# Patient Record
Sex: Female | Born: 1965 | Race: White | Hispanic: No | Marital: Single | Smoking: Never smoker
Health system: Southern US, Community
[De-identification: ages and names within clinical notes are randomized; demographics above are authoritative.]

## PROBLEM LIST (undated history)

## (undated) DIAGNOSIS — G4730 Sleep apnea, unspecified: Secondary | ICD-10-CM

## (undated) DIAGNOSIS — F419 Anxiety disorder, unspecified: Secondary | ICD-10-CM

## (undated) DIAGNOSIS — M353 Polymyalgia rheumatica: Secondary | ICD-10-CM

## (undated) DIAGNOSIS — E785 Hyperlipidemia, unspecified: Secondary | ICD-10-CM

## (undated) DIAGNOSIS — T7840XA Allergy, unspecified, initial encounter: Secondary | ICD-10-CM

## (undated) DIAGNOSIS — D649 Anemia, unspecified: Secondary | ICD-10-CM

## (undated) DIAGNOSIS — M1990 Unspecified osteoarthritis, unspecified site: Secondary | ICD-10-CM

## (undated) DIAGNOSIS — G4733 Obstructive sleep apnea (adult) (pediatric): Secondary | ICD-10-CM

## (undated) DIAGNOSIS — E119 Type 2 diabetes mellitus without complications: Secondary | ICD-10-CM

## (undated) DIAGNOSIS — H269 Unspecified cataract: Secondary | ICD-10-CM

## (undated) DIAGNOSIS — C801 Malignant (primary) neoplasm, unspecified: Secondary | ICD-10-CM

## (undated) DIAGNOSIS — F32A Depression, unspecified: Secondary | ICD-10-CM

## (undated) DIAGNOSIS — G4700 Insomnia, unspecified: Secondary | ICD-10-CM

## (undated) DIAGNOSIS — K219 Gastro-esophageal reflux disease without esophagitis: Secondary | ICD-10-CM

## (undated) DIAGNOSIS — I10 Essential (primary) hypertension: Secondary | ICD-10-CM

## (undated) HISTORY — DX: Hyperlipidemia, unspecified: E78.5

## (undated) HISTORY — PX: POLYPECTOMY: SHX149

## (undated) HISTORY — PX: ACHILLES TENDON SURGERY: SHX542

## (undated) HISTORY — DX: Allergy, unspecified, initial encounter: T78.40XA

## (undated) HISTORY — DX: Insomnia, unspecified: G47.00

## (undated) HISTORY — DX: Depression, unspecified: F32.A

## (undated) HISTORY — DX: Essential (primary) hypertension: I10

## (undated) HISTORY — DX: Morbid (severe) obesity due to excess calories: E66.01

## (undated) HISTORY — DX: Obstructive sleep apnea (adult) (pediatric): G47.33

## (undated) HISTORY — DX: Polymyalgia rheumatica: M35.3

## (undated) HISTORY — DX: Gastro-esophageal reflux disease without esophagitis: K21.9

## (undated) HISTORY — DX: Anemia, unspecified: D64.9

## (undated) HISTORY — DX: Malignant (primary) neoplasm, unspecified: C80.1

## (undated) HISTORY — DX: Anxiety disorder, unspecified: F41.9

## (undated) HISTORY — PX: TONSILLECTOMY: SUR1361

## (undated) HISTORY — DX: Unspecified osteoarthritis, unspecified site: M19.90

## (undated) HISTORY — DX: Type 2 diabetes mellitus without complications: E11.9

## (undated) HISTORY — DX: Unspecified cataract: H26.9

## (undated) HISTORY — DX: Sleep apnea, unspecified: G47.30

## (undated) HISTORY — PX: OTHER SURGICAL HISTORY: SHX169

## (undated) HISTORY — PX: WISDOM TOOTH EXTRACTION: SHX21

---

## 2004-09-11 ENCOUNTER — Ambulatory Visit (HOSPITAL_COMMUNITY): Admission: RE | Admit: 2004-09-11 | Discharge: 2004-09-11 | Payer: Self-pay | Admitting: *Deleted

## 2004-09-22 ENCOUNTER — Encounter: Admission: RE | Admit: 2004-09-22 | Discharge: 2004-09-22 | Payer: Self-pay | Admitting: Family Medicine

## 2005-03-21 ENCOUNTER — Encounter: Admission: RE | Admit: 2005-03-21 | Discharge: 2005-03-21 | Payer: Self-pay | Admitting: Family Medicine

## 2005-11-08 ENCOUNTER — Encounter: Admission: RE | Admit: 2005-11-08 | Discharge: 2005-11-08 | Payer: Self-pay | Admitting: Family Medicine

## 2009-03-02 ENCOUNTER — Other Ambulatory Visit: Admission: RE | Admit: 2009-03-02 | Discharge: 2009-03-02 | Payer: Self-pay | Admitting: Family Medicine

## 2009-03-08 ENCOUNTER — Encounter: Admission: RE | Admit: 2009-03-08 | Discharge: 2009-03-08 | Payer: Self-pay | Admitting: Family Medicine

## 2010-03-17 ENCOUNTER — Ambulatory Visit: Payer: Self-pay | Admitting: Diagnostic Radiology

## 2010-03-17 ENCOUNTER — Ambulatory Visit (HOSPITAL_BASED_OUTPATIENT_CLINIC_OR_DEPARTMENT_OTHER): Admission: RE | Admit: 2010-03-17 | Discharge: 2010-03-17 | Payer: Self-pay | Admitting: Family Medicine

## 2011-03-30 ENCOUNTER — Other Ambulatory Visit (HOSPITAL_BASED_OUTPATIENT_CLINIC_OR_DEPARTMENT_OTHER): Payer: Self-pay | Admitting: Family Medicine

## 2011-03-30 DIAGNOSIS — Z1231 Encounter for screening mammogram for malignant neoplasm of breast: Secondary | ICD-10-CM

## 2011-04-03 ENCOUNTER — Ambulatory Visit (HOSPITAL_BASED_OUTPATIENT_CLINIC_OR_DEPARTMENT_OTHER)
Admission: RE | Admit: 2011-04-03 | Discharge: 2011-04-03 | Disposition: A | Discharge status: Home / Self Care | Payer: BC Managed Care – PPO | Source: Ambulatory Visit | Attending: Family Medicine | Admitting: Family Medicine

## 2011-04-03 DIAGNOSIS — Z1231 Encounter for screening mammogram for malignant neoplasm of breast: Secondary | ICD-10-CM | POA: Insufficient documentation

## 2012-02-25 ENCOUNTER — Other Ambulatory Visit (HOSPITAL_BASED_OUTPATIENT_CLINIC_OR_DEPARTMENT_OTHER): Payer: Self-pay | Admitting: Family Medicine

## 2012-02-25 DIAGNOSIS — Z1231 Encounter for screening mammogram for malignant neoplasm of breast: Secondary | ICD-10-CM

## 2012-04-04 ENCOUNTER — Ambulatory Visit (HOSPITAL_BASED_OUTPATIENT_CLINIC_OR_DEPARTMENT_OTHER)
Admission: RE | Admit: 2012-04-04 | Discharge: 2012-04-04 | Disposition: A | Discharge status: Home / Self Care | Payer: BC Managed Care – PPO | Source: Ambulatory Visit | Attending: Family Medicine | Admitting: Family Medicine

## 2012-04-04 DIAGNOSIS — Z1231 Encounter for screening mammogram for malignant neoplasm of breast: Secondary | ICD-10-CM

## 2012-07-28 ENCOUNTER — Other Ambulatory Visit: Payer: Self-pay | Admitting: Family Medicine

## 2012-07-28 DIAGNOSIS — R102 Pelvic and perineal pain: Secondary | ICD-10-CM

## 2012-07-30 ENCOUNTER — Other Ambulatory Visit: Payer: BC Managed Care – PPO

## 2012-08-06 ENCOUNTER — Other Ambulatory Visit: Payer: BC Managed Care – PPO

## 2013-03-23 ENCOUNTER — Other Ambulatory Visit (HOSPITAL_BASED_OUTPATIENT_CLINIC_OR_DEPARTMENT_OTHER): Payer: Self-pay | Admitting: Family Medicine

## 2013-03-23 DIAGNOSIS — Z1231 Encounter for screening mammogram for malignant neoplasm of breast: Secondary | ICD-10-CM

## 2013-04-08 ENCOUNTER — Ambulatory Visit (HOSPITAL_BASED_OUTPATIENT_CLINIC_OR_DEPARTMENT_OTHER)
Admission: RE | Admit: 2013-04-08 | Discharge: 2013-04-08 | Disposition: A | Discharge status: Home / Self Care | Payer: BC Managed Care – PPO | Source: Ambulatory Visit | Attending: Family Medicine | Admitting: Family Medicine

## 2013-04-08 DIAGNOSIS — Z1231 Encounter for screening mammogram for malignant neoplasm of breast: Secondary | ICD-10-CM | POA: Insufficient documentation

## 2013-08-06 ENCOUNTER — Other Ambulatory Visit (HOSPITAL_COMMUNITY)
Admission: RE | Admit: 2013-08-06 | Discharge: 2013-08-06 | Disposition: A | Discharge status: Home / Self Care | Payer: BC Managed Care – PPO | Source: Ambulatory Visit | Attending: Family Medicine | Admitting: Family Medicine

## 2013-08-06 ENCOUNTER — Other Ambulatory Visit: Payer: Self-pay | Admitting: Family Medicine

## 2013-08-06 DIAGNOSIS — Z124 Encounter for screening for malignant neoplasm of cervix: Secondary | ICD-10-CM | POA: Insufficient documentation

## 2013-08-06 DIAGNOSIS — Z1151 Encounter for screening for human papillomavirus (HPV): Secondary | ICD-10-CM | POA: Insufficient documentation

## 2014-03-09 ENCOUNTER — Other Ambulatory Visit (HOSPITAL_BASED_OUTPATIENT_CLINIC_OR_DEPARTMENT_OTHER): Payer: Self-pay | Admitting: Family Medicine

## 2014-03-09 DIAGNOSIS — Z1231 Encounter for screening mammogram for malignant neoplasm of breast: Secondary | ICD-10-CM

## 2014-04-14 ENCOUNTER — Ambulatory Visit (HOSPITAL_BASED_OUTPATIENT_CLINIC_OR_DEPARTMENT_OTHER)
Admission: RE | Admit: 2014-04-14 | Discharge: 2014-04-14 | Disposition: A | Discharge status: Home / Self Care | Payer: BC Managed Care – PPO | Source: Ambulatory Visit | Attending: Family Medicine | Admitting: Family Medicine

## 2014-04-14 DIAGNOSIS — Z1231 Encounter for screening mammogram for malignant neoplasm of breast: Secondary | ICD-10-CM

## 2015-03-28 ENCOUNTER — Other Ambulatory Visit (HOSPITAL_BASED_OUTPATIENT_CLINIC_OR_DEPARTMENT_OTHER): Payer: Self-pay | Admitting: Family Medicine

## 2015-03-28 DIAGNOSIS — Z1231 Encounter for screening mammogram for malignant neoplasm of breast: Secondary | ICD-10-CM

## 2015-04-19 ENCOUNTER — Ambulatory Visit (HOSPITAL_BASED_OUTPATIENT_CLINIC_OR_DEPARTMENT_OTHER)
Admission: RE | Admit: 2015-04-19 | Discharge: 2015-04-19 | Disposition: A | Discharge status: Home / Self Care | Payer: BLUE CROSS/BLUE SHIELD | Source: Ambulatory Visit | Attending: Family Medicine | Admitting: Family Medicine

## 2015-04-19 DIAGNOSIS — Z1231 Encounter for screening mammogram for malignant neoplasm of breast: Secondary | ICD-10-CM | POA: Diagnosis not present

## 2015-05-16 ENCOUNTER — Other Ambulatory Visit: Payer: Self-pay | Admitting: Family Medicine

## 2015-05-16 DIAGNOSIS — N926 Irregular menstruation, unspecified: Secondary | ICD-10-CM

## 2015-05-25 ENCOUNTER — Ambulatory Visit
Admission: RE | Admit: 2015-05-25 | Discharge: 2015-05-25 | Disposition: A | Discharge status: Home / Self Care | Payer: BLUE CROSS/BLUE SHIELD | Source: Ambulatory Visit | Attending: Family Medicine | Admitting: Family Medicine

## 2015-05-25 DIAGNOSIS — N926 Irregular menstruation, unspecified: Secondary | ICD-10-CM

## 2016-01-17 DIAGNOSIS — E782 Mixed hyperlipidemia: Secondary | ICD-10-CM | POA: Insufficient documentation

## 2016-01-17 DIAGNOSIS — G4733 Obstructive sleep apnea (adult) (pediatric): Secondary | ICD-10-CM | POA: Insufficient documentation

## 2016-01-17 DIAGNOSIS — R7302 Impaired glucose tolerance (oral): Secondary | ICD-10-CM | POA: Insufficient documentation

## 2016-01-17 DIAGNOSIS — F419 Anxiety disorder, unspecified: Secondary | ICD-10-CM | POA: Insufficient documentation

## 2016-01-17 DIAGNOSIS — Z9989 Dependence on other enabling machines and devices: Secondary | ICD-10-CM | POA: Insufficient documentation

## 2016-06-30 DIAGNOSIS — M1711 Unilateral primary osteoarthritis, right knee: Secondary | ICD-10-CM | POA: Insufficient documentation

## 2016-06-30 DIAGNOSIS — M1712 Unilateral primary osteoarthritis, left knee: Secondary | ICD-10-CM | POA: Insufficient documentation

## 2017-08-27 DIAGNOSIS — M353 Polymyalgia rheumatica: Secondary | ICD-10-CM | POA: Insufficient documentation

## 2018-08-18 DIAGNOSIS — S86011A Strain of right Achilles tendon, initial encounter: Secondary | ICD-10-CM

## 2018-08-18 HISTORY — DX: Strain of right Achilles tendon, initial encounter: S86.011A

## 2020-03-17 DIAGNOSIS — N950 Postmenopausal bleeding: Secondary | ICD-10-CM | POA: Insufficient documentation

## 2020-03-17 HISTORY — DX: Postmenopausal bleeding: N95.0

## 2020-08-30 DIAGNOSIS — I10 Essential (primary) hypertension: Secondary | ICD-10-CM | POA: Insufficient documentation

## 2020-08-31 ENCOUNTER — Encounter: Payer: Self-pay | Admitting: Internal Medicine

## 2020-09-21 DIAGNOSIS — Z7952 Long term (current) use of systemic steroids: Secondary | ICD-10-CM | POA: Insufficient documentation

## 2020-09-21 DIAGNOSIS — M791 Myalgia, unspecified site: Secondary | ICD-10-CM | POA: Insufficient documentation

## 2020-10-06 ENCOUNTER — Other Ambulatory Visit: Payer: Self-pay

## 2020-10-17 ENCOUNTER — Encounter: Payer: Self-pay | Admitting: Internal Medicine

## 2020-10-17 ENCOUNTER — Ambulatory Visit: Payer: BC Managed Care – PPO | Admitting: Internal Medicine

## 2020-10-17 VITALS — BP 110/70 | HR 88 | Ht 65.0 in | Wt 254.0 lb

## 2020-10-17 DIAGNOSIS — Z1211 Encounter for screening for malignant neoplasm of colon: Secondary | ICD-10-CM | POA: Diagnosis not present

## 2020-10-17 DIAGNOSIS — R112 Nausea with vomiting, unspecified: Secondary | ICD-10-CM

## 2020-10-17 NOTE — Progress Notes (Signed)
Diana Herman 54 y.o. 1966/04/24 [id number]  Assessment & Plan:   Encounter Diagnoses  Name Primary?  . Nausea and vomiting, intractability of vomiting not specified, unspecified vomiting type Yes  . Colon cancer screening     Observe this episodic nausea and vomiting.  Cause not clear and it has not recurred in  2 months.  No other worrisome signs or symptoms.  If it recurs she should get a 2 view abdominal film to see if we can learn something question if there is some sort of obstructive process.  We have given her an order to carry and take to a radiology office versus calling us depending upon the timing of recurrent symptoms.  She does have a hemoglobin A1c of 7.2% and a diagnosis of impaired glucose tolerance on Metformin.  It would seem unlikely that she has gastroparesis but I suppose that is in the differential.  Would consider gastric emptying study in the future if she has recurrent problems.  Schedule screening colonoscopy.  The risks and benefits as well as alternatives of endoscopic procedure(s) have been discussed and reviewed. All questions answered. The patient agrees to proceed.  I appreciate the opportunity to care for this patient. 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  Past medical history notable for polymyalgia rheumatica on steroids.  3 episodes in June and 2 in September and none since.  There were no warning signs obvious triggers.  No prior issues with this.  She saw primary care and was recommended to take a PPI.  Ondansetron will help if she needs it.  No associated headaches or other symptoms or pain.  When she called to schedule a screening colonoscopy because of the signs and symptoms it was recommended she have an office visit.  Her upper  GI review of systems is otherwise notable for occasional heartburn.  No active lower GI symptoms at this time.  Mother had colon polyps but no family history of colon cancer. No Known Allergies Current Meds  Medication Sig  . alendronate (FOSAMAX) 35 MG tablet Take 1 tablet by mouth every 7 (seven) days.  . Calcium Carb-Cholecalciferol (CALCIUM-VITAMIN D) 500-400 MG-UNIT TABS Take 1 tablet by mouth daily.  . clonazePAM (KLONOPIN) 0.5 MG tablet Take 1 tablet by mouth in the morning and at bedtime.  . DULoxetine (CYMBALTA) 60 MG capsule Take 2 capsules by mouth daily.  Marland Kitchen gabapentin (NEURONTIN) 100 MG capsule Take 1 capsule by mouth 3 (three) times daily.  Marland Kitchen losartan (COZAAR) 25 MG tablet Take 1 tablet by mouth daily.  . metFORMIN (GLUCOPHAGE) 500 MG tablet Take 2 tablets by mouth daily.  . ondansetron (ZOFRAN) 4 MG tablet Take 1 tablet by mouth every 8 (eight) hours as needed.  . predniSONE (DELTASONE) 10 MG tablet Take 20 mg by mouth daily.   . sertraline (ZOLOFT) 100 MG tablet Take 1 tablet by mouth daily.  . traZODone (DESYREL) 50 MG tablet Take by mouth. 1-2 tablets by mouth nightly as needed  . VITAMIN D PO Take 1,000 mg by mouth daily.    Past Medical History:  Diagnosis Date  . Anemia   . Anxiety   . Depression   . DM (diabetes mellitus) ([place])   . Hyperlipidemia   . Hypertension   . Insomnia   .  Morbid obesity ([place])   . OA (osteoarthritis)   . OSA (obstructive sleep apnea)    on CPAP  . Polymyalgia rheumatica ([facility])    Past Surgical History:  Procedure Laterality Date  . ACHILLES TENDON SURGERY Right   . hysteroscopy and polypectomy    . TONSILLECTOMY     Social History   Social History Narrative   Patient is single, no children.     She is an eating disorders dietitian   Never smoker 1-2 caffeinated beverages daily no alcohol no other tobacco and no drug use   family history includes Alcoholism in her father and paternal grandmother; Colon polyps in her mother;  Depression in her maternal grandmother and mother; Diabetes in her father and maternal grandfather; Heart attack in her maternal grandmother; Heart disease in her maternal grandfather and maternal uncle; Hyperlipidemia in her maternal grandmother and mother; Hypertension in her maternal grandfather, maternal grandmother, and mother; Non-Hodgkin's lymphoma in her maternal grandmother; Pancreatitis in her father; Stroke in her maternal uncle.   Review of Systems As per HPI some difficulties with her joints and her polymyalgia rheumatica, insomnia pedal edema depressed mood anxious mood back pain fatigue.  All other review of systems negative.  Objective:   Physical Exam BP 110/70 (BP Location: Left Arm, Patient Position: Sitting, Cuff Size: Normal)   Pulse 88   Ht 5\' 5"  (1.651 m) Comment: height measured without shoes  Wt 254 lb (115.2 kg) Comment: pt refused  BMI 42.27 kg/m  NAD obese Lungs cta Cor NL abd soft and Nt obese, BS + no HSM/mass Alert and oriented x 21 August 2020 labs with a normal hemoglobin 11.6, white count 11.6 normal platelets.  Hemoglobin A1c 7.2% Electrolytes liver tests kidney function all normal in October.  TSH normal at 3.17.

## 2020-10-17 NOTE — Patient Instructions (Addendum)
Normal BMI (Body Mass Index- based on height and weight) is between 19 and 25. Your BMI today is There is no height or weight on file to calculate BMI. Marland Kitchen Please consider follow up  regarding your BMI with your Primary Care Provider.  It has been recommended to you by your physician that you have a(n) colonoscopy completed. Per your request, we did not schedule the procedure(s) today. Please contact our office at [phone] should you decide to have the procedure completed. You will be scheduled for a pre-visit and procedure at that time.  We are giving you a printed order for an abdominal x-ray, should you have the abdominal pain please get it done.   I appreciate the opportunity to care for you. Silvano Rusk, MD, [facility]

## 2021-03-16 ENCOUNTER — Encounter: Payer: Self-pay | Admitting: *Deleted

## 2021-03-31 ENCOUNTER — Other Ambulatory Visit: Payer: Self-pay

## 2021-03-31 ENCOUNTER — Ambulatory Visit (AMBULATORY_SURGERY_CENTER): Payer: Self-pay | Admitting: *Deleted

## 2021-03-31 VITALS — Ht 65.0 in | Wt 242.0 lb

## 2021-03-31 DIAGNOSIS — Z1211 Encounter for screening for malignant neoplasm of colon: Secondary | ICD-10-CM

## 2021-03-31 NOTE — Progress Notes (Signed)

## 2021-04-10 ENCOUNTER — Encounter: Payer: Self-pay | Admitting: Internal Medicine

## 2021-04-14 ENCOUNTER — Ambulatory Visit (AMBULATORY_SURGERY_CENTER): Payer: BC Managed Care – PPO | Admitting: Internal Medicine

## 2021-04-14 ENCOUNTER — Encounter: Payer: Self-pay | Admitting: Internal Medicine

## 2021-04-14 ENCOUNTER — Other Ambulatory Visit: Payer: Self-pay

## 2021-04-14 VITALS — BP 128/54 | HR 72 | Temp 97.3°F | Resp 11

## 2021-04-14 DIAGNOSIS — D120 Benign neoplasm of cecum: Secondary | ICD-10-CM

## 2021-04-14 DIAGNOSIS — D123 Benign neoplasm of transverse colon: Secondary | ICD-10-CM | POA: Diagnosis not present

## 2021-04-14 DIAGNOSIS — Z1211 Encounter for screening for malignant neoplasm of colon: Secondary | ICD-10-CM | POA: Diagnosis present

## 2021-04-14 HISTORY — PX: COLONOSCOPY: SHX174

## 2021-04-14 MED ORDER — SODIUM CHLORIDE 0.9 % IV SOLN: 500.0000 mL | Freq: Once | INTRAVENOUS | Status: DC

## 2021-04-14 NOTE — Progress Notes (Signed)
pt tolerated well. VSS. awake and to recovery. Report given to RN.  

## 2021-04-14 NOTE — Progress Notes (Signed)
Pt's states no medical or surgical changes since previsit or office visit. VS by CW. 

## 2021-04-14 NOTE — Op Note (Addendum)
[place] Patient Name: Diana Herman Procedure Date: 04/14/2021 11:22 AM MRN: [id number] Endoscopist: Gatha Mayer , MD Age: 55 Referring MD:  Date of Birth: [birth year] Gender: Female Account #: [id number] Procedure:                Colonoscopy Indications:              Screening for colorectal malignant neoplasm, This                            is the patient's first colonoscopy Medicines:                Propofol per Anesthesia, Monitored Anesthesia Care Procedure:                Pre-Anesthesia Assessment:                           - Prior to the procedure, a History and Physical                            was performed, and patient medications and                            allergies were reviewed. The patient's tolerance of                            previous anesthesia was also reviewed. The risks                            and benefits of the procedure and the sedation                            options and risks were discussed with the patient.                            All questions were answered, and informed consent                            was obtained. Prior Anticoagulants: The patient has                            taken no previous anticoagulant or antiplatelet                            agents. ASA Grade Assessment: II - A patient with                            mild systemic disease. After reviewing the risks                            and benefits, the patient was deemed in                            satisfactory condition to undergo the procedure.  After obtaining informed consent, the colonoscope                            was passed under direct vision. Throughout the                            procedure, the patient's blood pressure, pulse, and                            oxygen saturations were monitored continuously. The                            Olympus PCF-H190DL (#[id number]) Colonoscope was                             introduced through the anus and advanced to the the                            cecum, identified by appendiceal orifice and                            ileocecal valve. The colonoscopy was performed                            without difficulty. The patient tolerated the                            procedure well. The quality of the bowel                            preparation was good. The ileocecal valve,                            appendiceal orifice, and rectum were photographed.                            The bowel preparation used was Miralax via split                            dose instruction. Scope In: 11:35:22 AM Scope Out: 12:09:58 PM Scope Withdrawal Time: 0 hours 27 minutes 38 seconds  Total Procedure Duration: 0 hours 34 minutes 36 seconds  Findings:                 The perianal and digital rectal examinations were                            normal.                           A 25 to 30 mm polyp was found in the cecum. The                            polyp was sessile. The polyp was removed with a  piecemeal technique using a cold snare. Resection                            and retrieval were complete. Verification of                            patient identification for the specimen was done.                            Estimated blood loss was minimal. To prevent                            bleeding after the polypectomy, six hemostatic                            clips were successfully placed (MR conditional).                            There was no bleeding at the end of the procedure.                            Estimated blood loss: none.                           Two sessile polyps were found in the transverse                            colon. The polyps were diminutive in size. These                            polyps were removed with a cold snare. Resection                            and retrieval were complete. Verification of                             patient identification for the specimen was done.                            Estimated blood loss was minimal.                           The exam was otherwise without abnormality on                            direct and retroflexion views. Complications:            No immediate complications. Estimated Blood Loss:     Estimated blood loss was minimal. Impression:               - One 25 to 30 mm polyp in the cecum, removed                            piecemeal using a cold snare. Resected and  retrieved. Clips (MR conditional) were placed.                           - Two diminutive polyps in the transverse colon,                            removed with a cold snare. Resected and retrieved.                           - The examination was otherwise normal on direct                            and retroflexion views. Recommendation:           - Patient has a contact number available for                            emergencies. The signs and symptoms of potential                            delayed complications were discussed with the                            patient. Return to normal activities tomorrow.                            Written discharge instructions were provided to the                            patient.                           - Resume previous diet.                           - Continue present medications.                           - Repeat colonoscopy is recommended for                            surveillance. The colonoscopy date will be                            determined after pathology results from today's                            exam become available for review.                           - No aspirin, ibuprofen, naproxen, or other                            non-steroidal anti-inflammatory drugs for 2 weeks  after polyp removal. Gatha Mayer, MD 04/14/2021 12:17:26 PM This report has been signed  electronically.

## 2021-04-14 NOTE — Patient Instructions (Addendum)
I found and removed 3 polyps, 2 tiny and one large. All look benign.  I will let you know pathology results and when to have another routine colonoscopy by mail and/or My Chart.  I appreciate the opportunity to care for you. Gatha Mayer, MD, FACG     NO aspirin, ibuprofen, naproxen, or other non-steroidal anti inflammatory drugs for 2 weeks.   YOU HAD AN ENDOSCOPIC PROCEDURE TODAY AT [facility]:   Refer to the procedure report that was given to you for any specific questions about what was found during the examination.  If the procedure report does not answer your questions, please call your gastroenterologist to clarify.  If you requested that your care partner not be given the details of your procedure findings, then the procedure report has been included in a sealed envelope for you to review at your convenience later.  YOU SHOULD EXPECT: Some feelings of bloating in the abdomen. Passage of more gas than usual.  Walking can help get rid of the air that was put into your GI tract during the procedure and reduce the bloating. If you had a lower endoscopy (such as a colonoscopy or flexible sigmoidoscopy) you may notice spotting of blood in your stool or on the toilet paper. If you underwent a bowel prep for your procedure, you may not have a normal bowel movement for a few days.  Please Note:  You might notice some irritation and congestion in your nose or some drainage.  This is from the oxygen used during your procedure.  There is no need for concern and it should clear up in a day or so.  SYMPTOMS TO REPORT IMMEDIATELY:   Following lower endoscopy (colonoscopy or flexible sigmoidoscopy):  Excessive amounts of blood in the stool  Significant tenderness or worsening of abdominal pains  Swelling of the abdomen that is new, acute  Fever of 100F or higher   For urgent or emergent issues, a gastroenterologist can be reached at any hour by calling [phone]. Do not  use MyChart messaging for urgent concerns.    DIET:  We do recommend a small meal at first, but then you may proceed to your regular diet.  Drink plenty of fluids but you should avoid alcoholic beverages for 24 hours.  ACTIVITY:  You should plan to take it easy for the rest of today and you should NOT DRIVE or use heavy machinery until tomorrow (because of the sedation medicines used during the test).    FOLLOW UP: Our staff will call the number listed on your records 48-72 hours following your procedure to check on you and address any questions or concerns that you may have regarding the information given to you following your procedure. If we do not reach you, we will leave a message.  We will attempt to reach you two times.  During this call, we will ask if you have developed any symptoms of COVID 19. If you develop any symptoms (ie: fever, flu-like symptoms, shortness of breath, cough etc.) before then, please call [phone].  If you test positive for Covid 19 in the 2 weeks post procedure, please call and report this information to [place].    If any biopsies were taken you will be contacted by phone or by letter within the next 1-3 weeks.  Please call us at [phone] if you have not heard about the biopsies in 3 weeks.    SIGNATURES/CONFIDENTIALITY: You and/or your care partner have signed  paperwork which will be entered into your electronic medical record.  These signatures attest to the fact that that the information above on your After Visit Summary has been reviewed and is understood.  Full responsibility of the confidentiality of this discharge information lies with you and/or your care-partner.

## 2021-04-14 NOTE — Progress Notes (Signed)
Called to room to assist during endoscopic procedure.  Patient ID and intended procedure confirmed with present staff. Received instructions for my participation in the procedure from the performing physician.  

## 2021-04-18 ENCOUNTER — Telehealth: Payer: Self-pay | Admitting: *Deleted

## 2021-04-18 NOTE — Telephone Encounter (Signed)
  Follow up Call-  Call back number 04/14/2021  Post procedure Call Back phone  # [phone]  Permission to leave phone message Yes  Some recent data might be hidden     Patient questions:  Do you have a fever, pain , or abdominal swelling? No. Pain Score  0 *  Have you tolerated food without any problems? Yes.    Have you been able to return to your normal activities? Yes.    Do you have any questions about your discharge instructions: Diet   No. Medications  No. Follow up visit  No.  Do you have questions or concerns about your Care? No.  Actions: * If pain score is 4 or above: No action needed, pain <4.  1. Have you developed a fever since your procedure? no  2.   Have you had an respiratory symptoms (SOB or cough) since your procedure? no  3.   Have you tested positive for COVID 19 since your procedure no  4.   Have you had any family members/close contacts diagnosed with the COVID 19 since your procedure?  no   If yes to any of these questions please route to Joylene John, RN and Joella Prince, RN

## 2021-04-28 ENCOUNTER — Encounter: Payer: Self-pay | Admitting: Internal Medicine

## 2021-04-28 DIAGNOSIS — Z860101 Personal history of adenomatous and serrated colon polyps: Secondary | ICD-10-CM

## 2021-04-28 DIAGNOSIS — Z86010 Personal history of colonic polyps: Secondary | ICD-10-CM | POA: Insufficient documentation

## 2021-04-28 HISTORY — DX: Personal history of colonic polyps: Z86.010

## 2021-04-28 HISTORY — DX: Personal history of adenomatous and serrated colon polyps: Z86.0101

## 2021-08-13 ENCOUNTER — Encounter (HOSPITAL_COMMUNITY): Payer: Self-pay | Admitting: Internal Medicine

## 2021-08-13 ENCOUNTER — Emergency Department (HOSPITAL_COMMUNITY): Payer: BC Managed Care – PPO

## 2021-08-13 ENCOUNTER — Other Ambulatory Visit: Payer: Self-pay

## 2021-08-13 ENCOUNTER — Observation Stay (HOSPITAL_COMMUNITY)
Admission: EM | Admit: 2021-08-13 | Discharge: 2021-08-15 | Disposition: A | Discharge status: Home / Self Care | Payer: BC Managed Care – PPO | Attending: Internal Medicine | Admitting: Internal Medicine

## 2021-08-13 DIAGNOSIS — Z79899 Other long term (current) drug therapy: Secondary | ICD-10-CM | POA: Insufficient documentation

## 2021-08-13 DIAGNOSIS — N179 Acute kidney failure, unspecified: Secondary | ICD-10-CM

## 2021-08-13 DIAGNOSIS — S0030XA Unspecified superficial injury of nose, initial encounter: Secondary | ICD-10-CM | POA: Diagnosis not present

## 2021-08-13 DIAGNOSIS — E119 Type 2 diabetes mellitus without complications: Secondary | ICD-10-CM | POA: Diagnosis not present

## 2021-08-13 DIAGNOSIS — R112 Nausea with vomiting, unspecified: Secondary | ICD-10-CM | POA: Diagnosis not present

## 2021-08-13 DIAGNOSIS — Z9989 Dependence on other enabling machines and devices: Secondary | ICD-10-CM

## 2021-08-13 DIAGNOSIS — E860 Dehydration: Secondary | ICD-10-CM

## 2021-08-13 DIAGNOSIS — Z7984 Long term (current) use of oral hypoglycemic drugs: Secondary | ICD-10-CM | POA: Diagnosis not present

## 2021-08-13 DIAGNOSIS — W19XXXA Unspecified fall, initial encounter: Secondary | ICD-10-CM | POA: Insufficient documentation

## 2021-08-13 DIAGNOSIS — G4733 Obstructive sleep apnea (adult) (pediatric): Secondary | ICD-10-CM

## 2021-08-13 DIAGNOSIS — Z23 Encounter for immunization: Secondary | ICD-10-CM | POA: Diagnosis not present

## 2021-08-13 DIAGNOSIS — Z20822 Contact with and (suspected) exposure to covid-19: Secondary | ICD-10-CM | POA: Insufficient documentation

## 2021-08-13 DIAGNOSIS — R1110 Vomiting, unspecified: Secondary | ICD-10-CM | POA: Diagnosis present

## 2021-08-13 DIAGNOSIS — Z7952 Long term (current) use of systemic steroids: Secondary | ICD-10-CM

## 2021-08-13 DIAGNOSIS — R55 Syncope and collapse: Secondary | ICD-10-CM | POA: Insufficient documentation

## 2021-08-13 DIAGNOSIS — A059 Bacterial foodborne intoxication, unspecified: Secondary | ICD-10-CM

## 2021-08-13 DIAGNOSIS — M353 Polymyalgia rheumatica: Secondary | ICD-10-CM

## 2021-08-13 DIAGNOSIS — I10 Essential (primary) hypertension: Secondary | ICD-10-CM

## 2021-08-13 DIAGNOSIS — R197 Diarrhea, unspecified: Secondary | ICD-10-CM

## 2021-08-13 HISTORY — DX: Acute kidney failure, unspecified: N17.9

## 2021-08-13 LAB — POC OCCULT BLOOD, ED: Fecal Occult Bld: NEGATIVE

## 2021-08-13 LAB — CBC WITH DIFFERENTIAL/PLATELET
Abs Immature Granulocytes: 0 10*3/uL (ref 0.00–0.07)
Basophils Absolute: 0 10*3/uL (ref 0.0–0.1)
Basophils Relative: 0 %
Eosinophils Absolute: 0.7 10*3/uL — ABNORMAL HIGH (ref 0.0–0.5)
Eosinophils Relative: 4 %
HCT: 33.2 % — ABNORMAL LOW (ref 36.0–46.0)
Hemoglobin: 10.6 g/dL — ABNORMAL LOW (ref 12.0–15.0)
Lymphocytes Relative: 11 %
Lymphs Abs: 2 10*3/uL (ref 0.7–4.0)
MCH: 27.5 pg (ref 26.0–34.0)
MCHC: 31.9 g/dL (ref 30.0–36.0)
MCV: 86 fL (ref 80.0–100.0)
Monocytes Absolute: 0.7 10*3/uL (ref 0.1–1.0)
Monocytes Relative: 4 %
Neutro Abs: 14.6 10*3/uL — ABNORMAL HIGH (ref 1.7–7.7)
Neutrophils Relative %: 81 %
Platelets: 371 10*3/uL (ref 150–400)
RBC: 3.86 MIL/uL — ABNORMAL LOW (ref 3.87–5.11)
RDW: 15.9 % — ABNORMAL HIGH (ref 11.5–15.5)
WBC: 18 10*3/uL — ABNORMAL HIGH (ref 4.0–10.5)
nRBC: 0.2 % (ref 0.0–0.2)
nRBC: 1 /100 WBC — ABNORMAL HIGH

## 2021-08-13 LAB — COMPREHENSIVE METABOLIC PANEL
ALT: 20 U/L (ref 0–44)
AST: 24 U/L (ref 15–41)
Albumin: 3.1 g/dL — ABNORMAL LOW (ref 3.5–5.0)
Alkaline Phosphatase: 50 U/L (ref 38–126)
Anion gap: 15 (ref 5–15)
BUN: 22 mg/dL — ABNORMAL HIGH (ref 6–20)
CO2: 18 mmol/L — ABNORMAL LOW (ref 22–32)
Calcium: 8.6 mg/dL — ABNORMAL LOW (ref 8.9–10.3)
Chloride: 99 mmol/L (ref 98–111)
Creatinine, Ser: 3.36 mg/dL — ABNORMAL HIGH (ref 0.44–1.00)
GFR, Estimated: 16 mL/min — ABNORMAL LOW (ref >60)
Glucose, Bld: 130 mg/dL — ABNORMAL HIGH (ref 70–99)
Potassium: 3.9 mmol/L (ref 3.5–5.1)
Sodium: 132 mmol/L — ABNORMAL LOW (ref 135–145)
Total Bilirubin: 0.8 mg/dL (ref 0.3–1.2)
Total Protein: 6.1 g/dL — ABNORMAL LOW (ref 6.5–8.1)

## 2021-08-13 LAB — URINALYSIS, MICROSCOPIC (REFLEX)

## 2021-08-13 LAB — I-STAT CHEM 8, ED
BUN: 24 mg/dL — ABNORMAL HIGH (ref 6–20)
Calcium, Ion: 1.04 mmol/L — ABNORMAL LOW (ref 1.15–1.40)
Chloride: 99 mmol/L (ref 98–111)
Creatinine, Ser: 3.4 mg/dL — ABNORMAL HIGH (ref 0.44–1.00)
Glucose, Bld: 122 mg/dL — ABNORMAL HIGH (ref 70–99)
HCT: 34 % — ABNORMAL LOW (ref 36.0–46.0)
Hemoglobin: 11.6 g/dL — ABNORMAL LOW (ref 12.0–15.0)
Potassium: 3.9 mmol/L (ref 3.5–5.1)
Sodium: 133 mmol/L — ABNORMAL LOW (ref 135–145)
TCO2: 19 mmol/L — ABNORMAL LOW (ref 22–32)

## 2021-08-13 LAB — URINALYSIS, ROUTINE W REFLEX MICROSCOPIC
Bilirubin Urine: NEGATIVE
Glucose, UA: NEGATIVE mg/dL
Ketones, ur: NEGATIVE mg/dL
Nitrite: NEGATIVE
Protein, ur: NEGATIVE mg/dL
Specific Gravity, Urine: <1.005 — ABNORMAL LOW (ref 1.005–1.030)
pH: 6 (ref 5.0–8.0)

## 2021-08-13 LAB — RESP PANEL BY RT-PCR (FLU A&B, COVID) ARPGX2
Influenza A by PCR: NEGATIVE
Influenza B by PCR: NEGATIVE
SARS Coronavirus 2 by RT PCR: NEGATIVE

## 2021-08-13 LAB — LACTIC ACID, PLASMA
Lactic Acid, Venous: 4 mmol/L (ref 0.5–1.9)
Lactic Acid, Venous: 3.2 mmol/L (ref 0.5–1.9)
Lactic Acid, Venous: 6.2 mmol/L (ref 0.5–1.9)

## 2021-08-13 LAB — TYPE AND SCREEN
ABO/RH(D): O POS
Antibody Screen: NEGATIVE

## 2021-08-13 LAB — GLUCOSE, CAPILLARY: Glucose-Capillary: 108 mg/dL — ABNORMAL HIGH (ref 70–99)

## 2021-08-13 LAB — TROPONIN I (HIGH SENSITIVITY)
Troponin I (High Sensitivity): 173 ng/L (ref <18)
Troponin I (High Sensitivity): 111 ng/L (ref <18)

## 2021-08-13 LAB — HEMOGLOBIN A1C
Hgb A1c MFr Bld: 6.7 % — ABNORMAL HIGH (ref 4.8–5.6)
Mean Plasma Glucose: 145.59 mg/dL

## 2021-08-13 LAB — ABO/RH: ABO/RH(D): O POS

## 2021-08-13 LAB — PROTIME-INR
INR: 1.1 (ref 0.8–1.2)
Prothrombin Time: 13.9 seconds (ref 11.4–15.2)

## 2021-08-13 LAB — CBG MONITORING, ED: Glucose-Capillary: 97 mg/dL (ref 70–99)

## 2021-08-13 MED ORDER — ONDANSETRON HCL 4 MG PO TABS: 4.0000 mg | ORAL_TABLET | Freq: Four times a day (QID) | ORAL | Status: DC | PRN

## 2021-08-13 MED ORDER — CLONAZEPAM 0.5 MG PO TABS: 0.5000 mg | ORAL_TABLET | Freq: Two times a day (BID) | ORAL | Status: DC | PRN

## 2021-08-13 MED ORDER — HYDROCORTISONE SOD SUC (PF) 100 MG IJ SOLR
100.0000 mg | Freq: Once | INTRAMUSCULAR | Status: AC
  Administered 2021-08-13: 100 mg via INTRAVENOUS
  Filled 2021-08-13: qty 2

## 2021-08-13 MED ORDER — SODIUM CHLORIDE 0.9 % IV BOLUS
1000.0000 mL | Freq: Once | INTRAVENOUS | Status: AC
  Administered 2021-08-13: 1000 mL via INTRAVENOUS

## 2021-08-13 MED ORDER — INSULIN ASPART 100 UNIT/ML IJ SOLN
0.0000 [IU] | Freq: Three times a day (TID) | INTRAMUSCULAR | Status: DC
  Administered 2021-08-14 (×2): 2 [IU] via SUBCUTANEOUS
  Administered 2021-08-15: 3 [IU] via SUBCUTANEOUS

## 2021-08-13 MED ORDER — ACETAMINOPHEN 650 MG RE SUPP: 650.0000 mg | Freq: Four times a day (QID) | RECTAL | Status: DC | PRN

## 2021-08-13 MED ORDER — ACETAMINOPHEN 325 MG PO TABS
650.0000 mg | ORAL_TABLET | Freq: Four times a day (QID) | ORAL | Status: DC | PRN
  Administered 2021-08-14 – 2021-08-15 (×3): 650 mg via ORAL
  Filled 2021-08-13 (×3): qty 2

## 2021-08-13 MED ORDER — VANCOMYCIN HCL 2000 MG/400ML IV SOLN
2000.0000 mg | Freq: Once | INTRAVENOUS | Status: AC
  Administered 2021-08-13: 2000 mg via INTRAVENOUS
  Filled 2021-08-13: qty 400

## 2021-08-13 MED ORDER — ONDANSETRON HCL 4 MG/2ML IJ SOLN: 4.0000 mg | Freq: Four times a day (QID) | INTRAMUSCULAR | Status: DC | PRN

## 2021-08-13 MED ORDER — SERTRALINE HCL 100 MG PO TABS
100.0000 mg | ORAL_TABLET | Freq: Every day | ORAL | Status: DC
  Administered 2021-08-14 – 2021-08-15 (×2): 100 mg via ORAL
  Filled 2021-08-13 (×2): qty 1

## 2021-08-13 MED ORDER — LACTATED RINGERS IV SOLN: INTRAVENOUS | Status: DC

## 2021-08-13 MED ORDER — TRAZODONE HCL 50 MG PO TABS: 50.0000 mg | ORAL_TABLET | Freq: Every evening | ORAL | Status: DC | PRN

## 2021-08-13 MED ORDER — DULOXETINE HCL 60 MG PO CPEP
120.0000 mg | ORAL_CAPSULE | Freq: Every day | ORAL | Status: DC
  Administered 2021-08-14 – 2021-08-15 (×2): 120 mg via ORAL
  Filled 2021-08-13 (×2): qty 2

## 2021-08-13 MED ORDER — HEPARIN SODIUM (PORCINE) 5000 UNIT/ML IJ SOLN
5000.0000 [IU] | Freq: Three times a day (TID) | INTRAMUSCULAR | Status: DC
  Administered 2021-08-14 – 2021-08-15 (×3): 5000 [IU] via SUBCUTANEOUS
  Filled 2021-08-13 (×3): qty 1

## 2021-08-13 MED ORDER — METOPROLOL TARTRATE 5 MG/5ML IV SOLN: 2.5000 mg | Freq: Four times a day (QID) | INTRAVENOUS | Status: DC | PRN

## 2021-08-13 MED ORDER — SODIUM CHLORIDE 0.9 % IV SOLN
2.0000 g | Freq: Once | INTRAVENOUS | Status: AC
  Administered 2021-08-13: 2 g via INTRAVENOUS
  Filled 2021-08-13: qty 2

## 2021-08-13 MED ORDER — VANCOMYCIN VARIABLE DOSE PER UNSTABLE RENAL FUNCTION (PHARMACIST DOSING): Status: DC

## 2021-08-13 MED ORDER — PREDNISONE 20 MG PO TABS
20.0000 mg | ORAL_TABLET | Freq: Every day | ORAL | Status: DC
  Administered 2021-08-14 – 2021-08-15 (×2): 20 mg via ORAL
  Filled 2021-08-13 (×2): qty 1

## 2021-08-13 MED ORDER — SODIUM CHLORIDE 0.9 % IV SOLN: 2.0000 g | INTRAVENOUS | Status: DC

## 2021-08-13 MED ORDER — TETANUS-DIPHTH-ACELL PERTUSSIS 5-2.5-18.5 LF-MCG/0.5 IM SUSY
0.5000 mL | PREFILLED_SYRINGE | Freq: Once | INTRAMUSCULAR | Status: AC
  Administered 2021-08-13: 0.5 mL via INTRAMUSCULAR
  Filled 2021-08-13: qty 0.5

## 2021-08-13 MED ORDER — HYDROCODONE-ACETAMINOPHEN 5-325 MG PO TABS: 1.0000 | ORAL_TABLET | ORAL | Status: DC | PRN

## 2021-08-13 MED ORDER — INSULIN ASPART 100 UNIT/ML IJ SOLN: 0.0000 [IU] | Freq: Every day | INTRAMUSCULAR | Status: DC

## 2021-08-13 NOTE — ED Triage Notes (Signed)
Pt BIB [facility] from home after her brother found her unresponsive on the floor, she reports est. 4 syncopal episodes w/LOC in the past 2 days. Pt has had N/V/D for 2 days. EMS reports the pt was diaphoretic upon arrival with BP of 70/40. Pt is not on blood thinners

## 2021-08-13 NOTE — Progress Notes (Signed)
Pharmacy Antibiotic Note  ETTER ROYALL is a 55 y.o. female admitted on 08/13/2021 with sepsis.  Pharmacy has been consulted for vancomycin and cefepime dosing.  Patient with a history of anemia, anxiety, depression, cataract, DM, GERD, HLD, HTN, insomnia, morbid obsesity, OA, OSA (on CPAP), polymyalgia rheumatica. Patient presenting with near syncope following several episodes of N/V/D.  Patient's serum creatinine is 3.4 (SCr 0.79 per [facility] records from last month. WBC 18; LA 4  Plan: Cefepime 2g q24h Vancomycin 2000 mg once, subsequent dosing as indicated per random vancomycin level until renal function stable and/or improved, at which time scheduled dosing can be considered Follow-up cultures and de-escalate antibiotics as appropriate. Trend WBC, fever, clinical course F/u cultures De-escalate when able  Height: 5\' 5"  (165.1 cm) Weight: 111.1 kg (245 lb) IBW/kg (Calculated) : 57  Temp (24hrs), Avg:98.5 F (36.9 C), Min:98.5 F (36.9 C), Max:98.5 F (36.9 C)  Recent Labs  Lab 08/13/21 1348 08/13/21 1453  WBC 18.0*  --   CREATININE 3.36* 3.40*  LATICACIDVEN 4.0*  --     Estimated Creatinine Clearance: 23.7 mL/min (A) (by C-G formula based on SCr of 3.4 mg/dL (H)).    Allergies  Allergen Reactions   Other Other (See Comments)    Apri causes depression    Antimicrobials this admission: cefepime 9/25 >>  vancomycin 9/25 >>   Microbiology results: Pending  Thank you for allowing pharmacy to be a part of this patient's care.  Lorelei Pont, PharmD, BCPS 08/13/2021 4:07 PM ED Clinical Pharmacist -  [phone]

## 2021-08-13 NOTE — Assessment & Plan Note (Signed)
Given sudden onset of N/V/D soon after she ate at [facility], I assume this is from pre-formed toxins in the food. Continue supportive care with IV Zofran.

## 2021-08-13 NOTE — Assessment & Plan Note (Signed)
Admit to medical bed. Continue with IVF. Will stop IVF after 12 more hours.  Diabetic diet.  Repeat labs in AM and tomorrow afternoon. If pt shows steady improvement in renal function, is able to eat/drink/urinate without difficulty, then it is possible pt could be discharge tomorrow evening to f/u with PCP end of this week for repeat BMP.

## 2021-08-13 NOTE — Assessment & Plan Note (Signed)
Chronic. 

## 2021-08-13 NOTE — Assessment & Plan Note (Signed)
Stable

## 2021-08-13 NOTE — Assessment & Plan Note (Signed)
Hold losartan for now in face of AKI.

## 2021-08-13 NOTE — ED Provider Notes (Signed)
  Physical Exam  BP (!) 93/57   Pulse 99   Temp 98.5 F (36.9 C) (Oral)   Resp (!) 25   Ht 5\' 5"  (1.651 m)   Wt 111.1 kg   LMP 03/21/2012   SpO2 100%   BMI 40.77 kg/m   Physical Exam  ED Course/Procedures     Procedures  MDM  Received patient in signout.  Syncopal episodes.  Nausea vomiting diarrhea.  Hypotensive.  All began after eating at [place] [employer].  Has an acute kidney injury with a creatinine now up to 3.  Baseline is normal.  Labs reviewed.  White count mildly elevated.  Had empirically been given antibiotics.  Lactate elevated but I think this is more likely a dehydration as opposed to a severe sepsis.  Has had 2 L of fluid and third liter going in now.  Blood pressure has improved.  Patient feeling less dizzy.  Continuing to have diarrhea here.  Lac on nose repaired.  Will admit to unassigned medicine.  CRITICAL CARE Performed by: Davonna Belling Total critical care time: 30 minutes Critical care time was exclusive of separately billable procedures and treating other patients. Critical care was necessary to treat or prevent imminent or life-threatening deterioration. Critical care was time spent personally by me on the following activities: development of treatment plan with patient and/or surrogate as well as nursing, discussions with consultants, evaluation of patient's response to treatment, examination of patient, obtaining history from patient or surrogate, ordering and performing treatments and interventions, ordering and review of laboratory studies, ordering and review of radiographic studies, pulse oximetry and re-evaluation of patient's condition.        Davonna Belling, MD 08/13/21 [phone]

## 2021-08-13 NOTE — Progress Notes (Signed)
[facility] following sepsis 

## 2021-08-13 NOTE — Subjective & Objective (Signed)
CC: N/V/D HPI: 55 year old Caucasian female with a history of type 2 diabetes, history of polymyalgia rheumatica on chronic daily prednisone 20 mg, hypertension, morbid obesity who presents to the ER today with a overnight history of nausea vomiting and diarrhea.  Patient states that she went to [place] [employer] on [address]. yesterday evening for dinner.  This was about 6-7 o'clock.  She states that when she was driving home, she is had to get out of her car several times due to projectile vomiting.  Since she got home, she started having massive diarrhea.  Over the course the next 12 hours she continued to have nausea vomiting along with concomitant diarrhea.  Patient states he tried to drink some water but ended up throwing this back up.  By time her mother got to her this morning, the patient was feeling presyncopal every time she would try to stand up.   Patient brought to the ER.  Patient noted to be hypotensive on her first arrival .  With initial blood pressure of 81-54.  Code sepsis was started.  Patient given 3 L of fluid.  Patient is on chronic prednisone.  Patient given 100 mg of IV hydrocortisone.  Laboratory evaluation performed.  Initial lactic acid was 4.0.  Serum creatinine had increased to 3.36 with a BUN of 22 and a bicarbonate of 18.  After 3 L of fluid, lactic acid improved to 3.2.  Patient was no longer hypotensive.  She was awake and alert.  Repeat blood pressure had increased to 130/63.  Patient does have the urge to urinate now after 3 L.  Due to the patient's acute kidney injury, [facility] hospitalist contacted for admission.

## 2021-08-13 NOTE — Assessment & Plan Note (Signed)
Add SSI. Holding metformin.

## 2021-08-13 NOTE — Assessment & Plan Note (Signed)
Likely due to food poisoning.  Continue with supportive care.

## 2021-08-13 NOTE — ED Provider Notes (Signed)
[facility] Provider Note   CSN: [id number] Arrival date & time: 08/13/21  1344     History Chief Complaint  Patient presents with   Loss of Consciousness    Diana Herman is a 55 y.o. female.  55 year old female with prior medical history as detailed below presents for evaluation.  Patient reports that she ate at a restaurant last night -[employer].  She felt fine until shortly after finishing her meal.  On the way home from the restaurant she had multiple episodes of vomiting.  Overnight she had continued vomiting associated loose watery diarrhea.  She reports multiple episodes overnight of near syncope.  This morning her brother found her on the floor of her residence unresponsive.  Apparently she was able to be aroused and when her family tried to stand her up she passed out again.  Currently she is alert and oriented.  She denies specific injury or pain.  She does have a superficial laceration across the bridge of the nose.  She does not recall a specific fall that could have caused the laceration.  Prior history is significant for longstanding history of polymyalgia rheumatica.  She takes 20 mg of prednisone daily.  She does not think that she has had hypertension in the past related to her prolonged steroid use.  The history is provided by the patient.  Illness Location:  Nausea, vomiting, hypotension, syncope, diarrhea Severity:  Severe Onset quality:  Sudden Duration:  1 day Timing:  Constant Progression:  Unchanged Chronicity:  New     Past Medical History:  Diagnosis Date   Allergy    Anemia    Anxiety    Cataract    forming    Depression    DM (diabetes mellitus) ([place])    GERD (gastroesophageal reflux disease)    past    Hx of adenomatous colonic polyps 04/28/2021   Hyperlipidemia    Hypertension    Insomnia    Morbid obesity ([facility])    OA (osteoarthritis)    OSA (obstructive sleep apnea)    on CPAP    Polymyalgia rheumatica ([facility])    Sleep apnea    wears cpap     Patient Active Problem List   Diagnosis Date Noted   Hx of adenomatous colonic polyps 04/28/2021   Long term current use of systemic steroids 09/21/2020   Myalgia 09/21/2020   Primary hypertension 08/30/2020   Postmenopausal bleeding 03/17/2020   Morbid obesity ([place]) 01/01/2019   Achilles rupture, right 08/18/2018   Polymyalgia rheumatica ([place]) 08/27/2017   Primary osteoarthritis of left knee 06/30/2016   Primary osteoarthritis of right knee 06/30/2016   Anxiety 01/17/2016   Impaired glucose tolerance 01/17/2016   Mixed hyperlipidemia 01/17/2016   OSA on CPAP 01/17/2016    Past Surgical History:  Procedure Laterality Date   ACHILLES TENDON SURGERY Right    COLONOSCOPY  04/14/2021   [facility]   hysteroscopy and polypectomy     TONSILLECTOMY     WISDOM TOOTH EXTRACTION     early 90's      OB History   No obstetric history on file.     Family History  Problem Relation Age of Onset   Colon polyps Mother    Depression Mother    Hypertension Mother    Hyperlipidemia Mother    Diabetes Father    Alcoholism Father    Pancreatitis Father    Non-Hodgkin's lymphoma Maternal Grandmother    Heart attack Maternal  Grandmother    Depression Maternal Grandmother    Hypertension Maternal Grandmother    Hyperlipidemia Maternal Grandmother    Diabetes Maternal Grandfather    Heart disease Maternal Grandfather    Hypertension Maternal Grandfather    Alcoholism Paternal Grandmother    Diverticulitis Paternal Grandmother    Heart disease Maternal Uncle    Stroke Maternal Uncle    Colon cancer Neg Hx    Esophageal cancer Neg Hx    Rectal cancer Neg Hx    Stomach cancer Neg Hx     Social History   Tobacco Use   Smoking status: Never   Smokeless tobacco: Never  Vaping Use   Vaping Use: Never used  Substance Use Topics   Alcohol use: Yes    Comment: once a year   Drug use: Never    Home  Medications Prior to Admission medications   Medication Sig Start Date End Date Taking? Authorizing Provider  alendronate (FOSAMAX) 35 MG tablet Take 1 tablet by mouth every 7 (seven) days. 05/30/20   [provider]  Calcium Carb-Cholecalciferol (CALCIUM-VITAMIN D) 500-400 MG-UNIT TABS Take 1 tablet by mouth daily.    [provider]  clonazePAM (KLONOPIN) 0.5 MG tablet Take 1 tablet by mouth 2 (two) times daily as needed. 07/19/08   [provider]  DULoxetine (CYMBALTA) 60 MG capsule Take 2 capsules by mouth daily. 05/30/20   [provider]  eszopiclone (LUNESTA) 2 MG TABS tablet Take 1 tablet by mouth at bedtime. 01/04/21   [provider]  gabapentin (NEURONTIN) 300 MG capsule Take 1 capsule by mouth daily. 01/18/21   [provider]  losartan (COZAAR) 25 MG tablet Take 1 tablet by mouth daily. 08/31/20   [provider]  metFORMIN (GLUCOPHAGE) 500 MG tablet Take 1,000 mg by mouth daily with breakfast. 08/31/20   [provider]  ondansetron (ZOFRAN) 4 MG tablet Take 1 tablet by mouth every 8 (eight) hours as needed. 05/05/20   [provider]  pilocarpine (SALAGEN) 5 MG tablet Take by mouth as needed. 12/19/20 12/19/21  [provider]  predniSONE (DELTASONE) 10 MG tablet Take 20 mg by mouth daily.  08/08/20   [provider]  sertraline (ZOLOFT) 100 MG tablet Take 1 tablet by mouth daily. 07/11/20   [provider]  traZODone (DESYREL) 50 MG tablet Take by mouth. 1-2 tablets by mouth nightly as needed 05/30/20   [provider]  VITAMIN D PO Take 1,000 mg by mouth daily.     [provider]    Allergies    Other  Review of Systems   Review of Systems  All other systems reviewed and are negative.  Physical Exam Updated Vital Signs BP (!) 81/54 (BP Location: Right Arm)   Pulse 97   Temp 98.5 F (36.9 C) (Oral)   Resp (!) 24   Ht 5\' 5"  (1.651 m)   Wt 111.1 kg   LMP  03/21/2012   SpO2 96%   BMI 40.77 kg/m   Physical Exam Vitals and nursing note reviewed.  Constitutional:      General: She is not in acute distress.    Appearance: She is well-developed.  HENT:     Head: Normocephalic and atraumatic.     Nose:     Comments: Superficial 0.5 cm laceration over the bridge of the nose.  Nose itself is nontender.  There is no evidence of displaced nasal bone fracture.  No septal hematoma noted. Eyes:  Pupils: Pupils are equal, round, and reactive to light.     Comments: Pale conjunctivae   Cardiovascular:     Rate and Rhythm: Regular rhythm. Tachycardia present.     Heart sounds: Normal heart sounds.  Pulmonary:     Effort: Pulmonary effort is normal. No respiratory distress.     Breath sounds: Normal breath sounds.  Abdominal:     General: There is no distension.     Palpations: Abdomen is soft.     Tenderness: There is no abdominal tenderness.  Musculoskeletal:        General: No deformity. Normal range of motion.     Cervical back: Normal range of motion and neck supple.  Skin:    General: Skin is warm and dry.  Neurological:     General: No focal deficit present.     Mental Status: She is alert and oriented to person, place, and time.     Cranial Nerves: No cranial nerve deficit.     Sensory: No sensory deficit.     Motor: No weakness.    ED Results / Procedures / Treatments   Labs (all labs ordered are listed, but only abnormal results are displayed) Labs Reviewed  RESP PANEL BY RT-PCR (FLU A&B, COVID) ARPGX2  CULTURE, BLOOD (ROUTINE X 2)  CULTURE, BLOOD (ROUTINE X 2)  COMPREHENSIVE METABOLIC PANEL  LACTIC ACID, PLASMA  LACTIC ACID, PLASMA  CBC WITH DIFFERENTIAL/PLATELET  PROTIME-INR  URINALYSIS, ROUTINE W REFLEX MICROSCOPIC  I-STAT CHEM 8, ED  CBG MONITORING, ED  POC OCCULT BLOOD, ED  TYPE AND SCREEN  TROPONIN I (HIGH SENSITIVITY)    EKG EKG Interpretation  Date/Time:  Sunday August 13 2021 13:56:58  EDT Ventricular Rate:  100 PR Interval:  132 QRS Duration: 109 QT Interval:  377 QTC Calculation: 487 R Axis:   90 Text Interpretation: Sinus tachycardia Borderline right axis deviation Minimal ST depression, lateral leads Borderline prolonged QT interval Confirmed by Dene Gentry [phone]) on 08/13/2021 2:09:48 PM  Radiology CT Head Wo Contrast  Result Date: 08/13/2021 CLINICAL DATA:  Pt BIB [facility] from home after her brother found her unresponsive on the floor, she reports est. 4 syncopal episodes w/LOC in the past 2 days. Pt has had N/V/D for 2 days. EMS reports the pt was diaphoretic upon arrival with BP of 70/40. Pt is not on blood thinners EXAM: CT HEAD WITHOUT CONTRAST CT CERVICAL SPINE WITHOUT CONTRAST TECHNIQUE: Multidetector CT imaging of the head and cervical spine was performed following the standard protocol without intravenous contrast. Multiplanar CT image reconstructions of the cervical spine were also generated. COMPARISON:  None. FINDINGS: CT HEAD FINDINGS Brain: No evidence of acute infarction, hemorrhage, hydrocephalus, extra-axial collection or mass lesion/mass effect. Vascular: No hyperdense vessel or unexpected calcification. Skull: Normal. Negative for fracture or focal lesion. Sinuses/Orbits: Normal globes and orbits.  Clear sinuses. Other: None. CT CERVICAL SPINE FINDINGS Alignment: Normal. Skull base and vertebrae: No acute fracture. No primary bone lesion or focal pathologic process. Soft tissues and spinal canal: No prevertebral fluid or swelling. No visible canal hematoma. Disc levels: Disc spaces, central spinal canal and neural foramina are well preserved. No evidence of a disc herniation. Small anterior endplate osteophytes, C3 through C7. No other degenerative change. Upper chest: Negative. Other: None. IMPRESSION: HEAD CT 1. Normal. CERVICAL CT 1. No fracture or acute finding. Electronically Signed   By: Lajean Manes M.D.   On: 08/13/2021 14:18   CT Cervical Spine Wo  Contrast  Result Date: 08/13/2021  CLINICAL DATA:  Pt BIB [facility] from home after her brother found her unresponsive on the floor, she reports est. 4 syncopal episodes w/LOC in the past 2 days. Pt has had N/V/D for 2 days. EMS reports the pt was diaphoretic upon arrival with BP of 70/40. Pt is not on blood thinners EXAM: CT HEAD WITHOUT CONTRAST CT CERVICAL SPINE WITHOUT CONTRAST TECHNIQUE: Multidetector CT imaging of the head and cervical spine was performed following the standard protocol without intravenous contrast. Multiplanar CT image reconstructions of the cervical spine were also generated. COMPARISON:  None. FINDINGS: CT HEAD FINDINGS Brain: No evidence of acute infarction, hemorrhage, hydrocephalus, extra-axial collection or mass lesion/mass effect. Vascular: No hyperdense vessel or unexpected calcification. Skull: Normal. Negative for fracture or focal lesion. Sinuses/Orbits: Normal globes and orbits.  Clear sinuses. Other: None. CT CERVICAL SPINE FINDINGS Alignment: Normal. Skull base and vertebrae: No acute fracture. No primary bone lesion or focal pathologic process. Soft tissues and spinal canal: No prevertebral fluid or swelling. No visible canal hematoma. Disc levels: Disc spaces, central spinal canal and neural foramina are well preserved. No evidence of a disc herniation. Small anterior endplate osteophytes, C3 through C7. No other degenerative change. Upper chest: Negative. Other: None. IMPRESSION: HEAD CT 1. Normal. CERVICAL CT 1. No fracture or acute finding. Electronically Signed   By: Lajean Manes M.D.   On: 08/13/2021 14:18    Procedures .Marland KitchenLaceration Repair  Date/Time: 08/13/2021 2:43 PM Performed by: Valarie Merino, MD Authorized by: Valarie Merino, MD   Consent:    Consent obtained:  Verbal   Risks, benefits, and alternatives were discussed: yes     Risks discussed:  Infection, need for additional repair, nerve damage, poor wound healing, poor cosmetic result, retained  foreign body, tendon damage, vascular damage and pain Universal protocol:    Immediately prior to procedure, a time out was called: yes     Patient identity confirmed:  Verbally with patient Anesthesia:    Anesthesia method:  None Laceration details:    Location: nasal bridge.   Length (cm):  0.5 Exploration:    Limited defect created (wound extended): yes     Hemostasis achieved with:  Direct pressure   Contaminated: yes   Treatment:    Area cleansed with:  Saline   Amount of cleaning:  Standard   Irrigation solution:  Sterile saline   Visualized foreign bodies/material removed: no     Debridement:  None   Undermining:  None Skin repair:    Repair method:  Tissue adhesive Approximation:    Approximation:  Close Repair type:    Repair type:  Simple Post-procedure details:    Dressing:  Open (no dressing)   Procedure completion:  Tolerated   Medications Ordered in ED Medications  Tdap (BOOSTRIX) injection 0.5 mL (has no administration in time range)  hydrocortisone sodium succinate (SOLU-CORTEF) 100 MG injection 100 mg (has no administration in time range)  ceFEPIme (MAXIPIME) 2 g in sodium chloride 0.9 % 100 mL IVPB (has no administration in time range)  vancomycin (VANCOREADY) IVPB 2000 mg/400 mL (has no administration in time range)  sodium chloride 0.9 % bolus 1,000 mL (1,000 mLs Intravenous New Bag/Given 08/13/21 1413)    ED Course  I have reviewed the triage vital signs and the nursing notes.  Pertinent labs & imaging results that were available during my care of the patient were reviewed by me and considered in my medical decision making (see chart for details).    MDM  Rules/Calculators/A&P                          CRITICAL CARE Performed by: Valarie Merino   Total critical care time: 30 minutes  Critical care time was exclusive of separately billable procedures and treating other patients.  Critical care was necessary to treat or prevent imminent or  life-threatening deterioration.  Critical care was time spent personally by me on the following activities: development of treatment plan with patient and/or surrogate as well as nursing, discussions with consultants, evaluation of patient's response to treatment, examination of patient, obtaining history from patient or surrogate, ordering and performing treatments and interventions, ordering and review of laboratory studies, ordering and review of radiographic studies, pulse oximetry and re-evaluation of patient's condition.   MDM  MSE complete  Diana Herman was evaluated in Emergency Department on 08/13/2021 for the symptoms described in the history of present illness. She was evaluated in the context of the global COVID-19 pandemic, which necessitated consideration that the patient might be at risk for infection with the SARS-CoV-2 virus that causes COVID-19. Institutional protocols and algorithms that pertain to the evaluation of patients at risk for COVID-19 are in a state of rapid change based on information released by regulatory bodies including the CDC and federal and state organizations. These policies and algorithms were followed during the patient's care in the ED.  Patient is presenting with complaint of significant nausea, vomiting, and diarrhea.  This was associated with syncopal event and hypotension.  Patient is on longstanding chronic steroids.  Patient is mentating well upon arrival to the ED.  EMS administered 1 L IV fluids prior to arrival.  Will continue to fluid resuscitate and will.  We will also administer stress dose steroids and broad-spectrum antibiotics.  Patient will likely require admission. Dispo signed out to Dr. Alvino Chapel.     Final Clinical Impression(s) / ED Diagnoses Final diagnoses:  Dehydration  AKI (acute kidney injury) ([place])  Diarrhea, unspecified type    Rx / DC Orders ED Discharge Orders     None        Valarie Merino,  MD 08/14/21 2344

## 2021-08-13 NOTE — Assessment & Plan Note (Signed)
Continue with prednisone 20 mg daily. 

## 2021-08-13 NOTE — H&P (Signed)
History and Physical    MIKENNA BUNKLEY IWP:[id number] DOB: May 23, 1966 DOA: 08/13/2021  PCP: Wayland Salinas, MD   Patient coming from: Home  I have personally briefly reviewed patient's old medical records in [place]  CC: N/V/D HPI: 55 year old Caucasian female with a history of type 2 diabetes, history of polymyalgia rheumatica on chronic daily prednisone 20 mg, hypertension, morbid obesity who presents to the ER today with a overnight history of nausea vomiting and diarrhea.  Patient states that she went to [employer] on [address]. yesterday evening for dinner.  This was about 6-7 o'clock.  She states that when she was driving home, she is had to get out of her car several times due to projectile vomiting.  Since she got home, she started having massive diarrhea.  Over the course the next 12 hours she continued to have nausea vomiting along with concomitant diarrhea.  Patient states he tried to drink some water but ended up throwing this back up.  By time her mother got to her this morning, the patient was feeling presyncopal every time she would try to stand up.   Patient brought to the ER.  Patient noted to be hypotensive on her first arrival .  With initial blood pressure of 81-54.  Code sepsis was started.  Patient given 3 L of fluid.  Patient is on chronic prednisone.  Patient given 100 mg of IV hydrocortisone.  Laboratory evaluation performed.  Initial lactic acid was 4.0.  Serum creatinine had increased to 3.36 with a BUN of 22 and a bicarbonate of 18.  After 3 L of fluid, lactic acid improved to 3.2.  Patient was no longer hypotensive.  She was awake and alert.  Repeat blood pressure had increased to 130/63.  Patient does have the urge to urinate now after 3 L.  Due to the patient's acute kidney injury, [facility] contacted for admission.   ED Course: given 3 L IVF in ER. Given 1 dose of IV hydrocortisone in ER. Labs show AKI. Hemoccult  negative.  Review of Systems:  Review of Systems  Constitutional:  Positive for malaise/fatigue.  HENT: Negative.    Eyes: Negative.   Respiratory: Negative.    Cardiovascular: Negative.        Felt presyncopal when trying to stand up after having N/V/D all night.  Gastrointestinal:  Positive for abdominal pain, diarrhea, nausea and vomiting. Negative for blood in stool, constipation and melena.  Genitourinary: Negative.   Musculoskeletal: Negative.   Skin: Negative.   Neurological:  Positive for weakness.  Endo/Heme/Allergies: Negative.   Psychiatric/Behavioral: Negative.    All other systems reviewed and are negative.  Past Medical History:  Diagnosis Date   Allergy    Anemia    Anxiety    Cataract    forming    Depression    DM (diabetes mellitus) (HCC)    GERD (gastroesophageal reflux disease)    past    Hx of adenomatous colonic polyps 04/28/2021   Hyperlipidemia    Hypertension    Insomnia    Morbid obesity (HCC)    OA (osteoarthritis)    OSA (obstructive sleep apnea)    on CPAP   Polymyalgia rheumatica ([place])    Sleep apnea    wears cpap     Past Surgical History:  Procedure Laterality Date   ACHILLES TENDON SURGERY Right    COLONOSCOPY  04/14/2021   [facility]   hysteroscopy and polypectomy  TONSILLECTOMY     WISDOM TOOTH EXTRACTION     early 90's      reports that she has never smoked. She has never used smokeless tobacco. She reports current alcohol use. She reports that she does not use drugs.  Allergies  Allergen Reactions   Other Other (See Comments)    Apri causes depression    Family History  Problem Relation Age of Onset   Colon polyps Mother    Depression Mother    Hypertension Mother    Hyperlipidemia Mother    Diabetes Father    Alcoholism Father    Pancreatitis Father    Non-Hodgkin's lymphoma Maternal Grandmother    Heart attack Maternal Grandmother    Depression Maternal Grandmother    Hypertension Maternal  Grandmother    Hyperlipidemia Maternal Grandmother    Diabetes Maternal Grandfather    Heart disease Maternal Grandfather    Hypertension Maternal Grandfather    Alcoholism Paternal Grandmother    Diverticulitis Paternal Grandmother    Heart disease Maternal Uncle    Stroke Maternal Uncle    Colon cancer Neg Hx    Esophageal cancer Neg Hx    Rectal cancer Neg Hx    Stomach cancer Neg Hx     Prior to Admission medications   Medication Sig Start Date End Date Taking? Authorizing Provider  alendronate (FOSAMAX) 35 MG tablet Take 1 tablet by mouth every 7 (seven) days. 05/30/20   [provider]  Calcium Carb-Cholecalciferol (CALCIUM-VITAMIN D) 500-400 MG-UNIT TABS Take 1 tablet by mouth daily.    [provider]  clonazePAM (KLONOPIN) 0.5 MG tablet Take 1 tablet by mouth 2 (two) times daily as needed. 07/19/08   [provider]  DULoxetine (CYMBALTA) 60 MG capsule Take 2 capsules by mouth daily. 05/30/20   [provider]  eszopiclone (LUNESTA) 2 MG TABS tablet Take 1 tablet by mouth at bedtime. 01/04/21   [provider]  gabapentin (NEURONTIN) 300 MG capsule Take 1 capsule by mouth daily. 01/18/21   [provider]  losartan (COZAAR) 25 MG tablet Take 1 tablet by mouth daily. 08/31/20   [provider]  metFORMIN (GLUCOPHAGE) 500 MG tablet Take 1,000 mg by mouth daily with breakfast. 08/31/20   [provider]  ondansetron (ZOFRAN) 4 MG tablet Take 1 tablet by mouth every 8 (eight) hours as needed. 05/05/20   [provider]  pilocarpine (SALAGEN) 5 MG tablet Take by mouth as needed. 12/19/20 12/19/21  [provider]  predniSONE (DELTASONE) 10 MG tablet Take 20 mg by mouth daily.  08/08/20   [provider]  sertraline (ZOLOFT) 100 MG tablet Take 1 tablet by mouth daily. 07/11/20   [provider]  traZODone (DESYREL) 50 MG tablet Take by mouth. 1-2 tablets by mouth nightly as needed 05/30/20    [provider]  VITAMIN D PO Take 1,000 mg by mouth daily.     [provider]    Physical Exam: Vitals:   08/13/21 1800 08/13/21 1802 08/13/21 1815 08/13/21 1915  BP: (!) 104/49 (!) 104/49 (!) 127/55 130/63  Pulse: 95 95 100 97  Resp: 19 17 (!) 35 (!) 26  Temp:      TempSrc:      SpO2: 100% 100% 99% 100%  Weight:      Height:        Physical Exam Vitals and nursing note reviewed.  Constitutional:      General: She is not in acute distress.  Appearance: Normal appearance. She is obese. She is not ill-appearing, toxic-appearing or diaphoretic.  HENT:     Head: Normocephalic and atraumatic.     Nose: Nose normal. No rhinorrhea.  Eyes:     General: No scleral icterus. Cardiovascular:     Rate and Rhythm: Normal rate and regular rhythm.     Pulses: Normal pulses.  Pulmonary:     Effort: Pulmonary effort is normal. No respiratory distress.     Breath sounds: Normal breath sounds. No wheezing or rales.  Abdominal:     General: Abdomen is protuberant. Bowel sounds are normal. There is no distension.     Palpations: Abdomen is soft.     Tenderness: There is no abdominal tenderness. There is no guarding or rebound.  Musculoskeletal:     Right lower leg: No edema.     Left lower leg: No edema.  Skin:    General: Skin is warm and dry.     Capillary Refill: Capillary refill takes less than 2 seconds.  Neurological:     General: No focal deficit present.     Mental Status: She is alert and oriented to person, place, and time.     Labs on Admission: I have personally reviewed following labs and imaging studies  CBC: Recent Labs  Lab 08/13/21 1348 08/13/21 1453  WBC 18.0*  --   NEUTROABS 14.6*  --   HGB 10.6* 11.6*  HCT 33.2* 34.0*  MCV 86.0  --   PLT 371  --    Basic Metabolic Panel: Recent Labs  Lab 08/13/21 1348 08/13/21 1453  NA 132* 133*  K 3.9 3.9  CL 99 99  CO2 18*  --   GLUCOSE 130* 122*  BUN 22* 24*  CREATININE 3.36* 3.40*   CALCIUM 8.6*  --    GFR: Estimated Creatinine Clearance: 23.7 mL/min (A) (by C-G formula based on SCr of 3.4 mg/dL (H)). Liver Function Tests: Recent Labs  Lab 08/13/21 1348  AST 24  ALT 20  ALKPHOS 50  BILITOT 0.8  PROT 6.1*  ALBUMIN 3.1*   No results for input(s): LIPASE, AMYLASE in the last 168 hours. No results for input(s): AMMONIA in the last 168 hours. Coagulation Profile: Recent Labs  Lab 08/13/21 1348  INR 1.1   Cardiac Enzymes: No results for input(s): CKTOTAL, CKMB, CKMBINDEX, TROPONINI in the last 168 hours. BNP (last 3 results) No results for input(s): PROBNP in the last 8760 hours. HbA1C: No results for input(s): HGBA1C in the last 72 hours. CBG: Recent Labs  Lab 08/13/21 1508  GLUCAP 97   Lipid Profile: No results for input(s): CHOL, HDL, LDLCALC, TRIG, CHOLHDL, LDLDIRECT in the last 72 hours. Thyroid Function Tests: No results for input(s): TSH, T4TOTAL, FREET4, T3FREE, THYROIDAB in the last 72 hours. Anemia Panel: No results for input(s): VITAMINB12, FOLATE, FERRITIN, TIBC, IRON, RETICCTPCT in the last 72 hours. Urine analysis: No results found for: COLORURINE, APPEARANCEUR, [place], [place], GLUCOSEU, HGBUR, BILIRUBINUR, KETONESUR, PROTEINUR, UROBILINOGEN, NITRITE, LEUKOCYTESUR  Radiological Exams on Admission: I have personally reviewed images CT Head Wo Contrast  Result Date: 08/13/2021 CLINICAL DATA:  Pt BIB [facility] from home after her brother found her unresponsive on the floor, she reports est. 4 syncopal episodes w/LOC in the past 2 days. Pt has had N/V/D for 2 days. EMS reports the pt was diaphoretic upon arrival with BP of 70/40. Pt is not on blood thinners EXAM: CT HEAD WITHOUT CONTRAST CT CERVICAL SPINE WITHOUT CONTRAST TECHNIQUE: Multidetector CT imaging of  the head and cervical spine was performed following the standard protocol without intravenous contrast. Multiplanar CT image reconstructions of the cervical spine were also generated.  COMPARISON:  None. FINDINGS: CT HEAD FINDINGS Brain: No evidence of acute infarction, hemorrhage, hydrocephalus, extra-axial collection or mass lesion/mass effect. Vascular: No hyperdense vessel or unexpected calcification. Skull: Normal. Negative for fracture or focal lesion. Sinuses/Orbits: Normal globes and orbits.  Clear sinuses. Other: None. CT CERVICAL SPINE FINDINGS Alignment: Normal. Skull base and vertebrae: No acute fracture. No primary bone lesion or focal pathologic process. Soft tissues and spinal canal: No prevertebral fluid or swelling. No visible canal hematoma. Disc levels: Disc spaces, central spinal canal and neural foramina are well preserved. No evidence of a disc herniation. Small anterior endplate osteophytes, C3 through C7. No other degenerative change. Upper chest: Negative. Other: None. IMPRESSION: HEAD CT 1. Normal. CERVICAL CT 1. No fracture or acute finding. Electronically Signed   By: Lajean Manes M.D.   On: 08/13/2021 14:18   CT Cervical Spine Wo Contrast  Result Date: 08/13/2021 CLINICAL DATA:  Pt BIB [facility] from home after her brother found her unresponsive on the floor, she reports est. 4 syncopal episodes w/LOC in the past 2 days. Pt has had N/V/D for 2 days. EMS reports the pt was diaphoretic upon arrival with BP of 70/40. Pt is not on blood thinners EXAM: CT HEAD WITHOUT CONTRAST CT CERVICAL SPINE WITHOUT CONTRAST TECHNIQUE: Multidetector CT imaging of the head and cervical spine was performed following the standard protocol without intravenous contrast. Multiplanar CT image reconstructions of the cervical spine were also generated. COMPARISON:  None. FINDINGS: CT HEAD FINDINGS Brain: No evidence of acute infarction, hemorrhage, hydrocephalus, extra-axial collection or mass lesion/mass effect. Vascular: No hyperdense vessel or unexpected calcification. Skull: Normal. Negative for fracture or focal lesion. Sinuses/Orbits: Normal globes and orbits.  Clear sinuses. Other: None.  CT CERVICAL SPINE FINDINGS Alignment: Normal. Skull base and vertebrae: No acute fracture. No primary bone lesion or focal pathologic process. Soft tissues and spinal canal: No prevertebral fluid or swelling. No visible canal hematoma. Disc levels: Disc spaces, central spinal canal and neural foramina are well preserved. No evidence of a disc herniation. Small anterior endplate osteophytes, C3 through C7. No other degenerative change. Upper chest: Negative. Other: None. IMPRESSION: HEAD CT 1. Normal. CERVICAL CT 1. No fracture or acute finding. Electronically Signed   By: Lajean Manes M.D.   On: 08/13/2021 14:18   DG Chest Port 1 View  Result Date: 08/13/2021 CLINICAL DATA:  Weakness.  Chest pain and shortness of breath. EXAM: PORTABLE CHEST 1 VIEW COMPARISON:  None. FINDINGS: Cardiac silhouette is normal in size. No mediastinal or hilar masses. Clear lungs. No pleural effusion or pneumothorax. Skeletal structures are grossly intact. IMPRESSION: No active disease. Electronically Signed   By: Lajean Manes M.D.   On: 08/13/2021 14:43    EKG: I have personally reviewed EKG: shows sinus tachycardia   Assessment/Plan Principal Problem:   AKI (acute kidney injury) ([place]) Active Problems:   Food poisoning   Nausea vomiting and diarrhea   Long term current use of systemic steroids   Morbid obesity (HCC)   OSA on CPAP   Polymyalgia rheumatica (HCC)   Primary hypertension   Type 2 diabetes mellitus without complication ([place])    AKI (acute kidney injury) ([place]) Admit to medical bed. Continue with IVF. Will stop IVF after 12 more hours.  Diabetic diet.  Repeat labs in AM and tomorrow afternoon. If pt shows steady  improvement in renal function, is able to eat/drink/urinate without difficulty, then it is possible pt could be discharge tomorrow evening to f/u with PCP end of this week for repeat BMP.  Food poisoning Given sudden onset of N/V/D soon after she ate at [facility], I assume this is from  pre-formed toxins in the food. Continue supportive care with IV Zofran.  Nausea vomiting and diarrhea Likely due to food poisoning.  Continue with supportive care.  Long term current use of systemic steroids Continue with prednisone. Pt received 1 dose of IV hydrocortisone in ER.  OSA on CPAP Stable.  Polymyalgia rheumatica (HCC) Continue with prednisone 20 mg daily.  Primary hypertension Hold losartan for now in face of AKI.  Type 2 diabetes mellitus without complication (HCC) Add SSI. Holding metformin.  Morbid obesity (HCC) Chronic.  DVT prophylaxis: SQ Heparin Code Status: Full Code Family Communication: discussed with pt and pt's mother Gwynn at bedside  Disposition Plan: return home  Consults called: none  Admission status: Observation, Med-Surg   Kristopher Oppenheim, DO [facility] 08/13/2021, 7:42 PM

## 2021-08-13 NOTE — Plan of Care (Signed)

## 2021-08-13 NOTE — Assessment & Plan Note (Signed)
Continue with prednisone. Pt received 1 dose of IV hydrocortisone in ER.

## 2021-08-14 DIAGNOSIS — N179 Acute kidney failure, unspecified: Secondary | ICD-10-CM | POA: Diagnosis not present

## 2021-08-14 LAB — COMPREHENSIVE METABOLIC PANEL
ALT: 19 U/L (ref 0–44)
AST: 18 U/L (ref 15–41)
Albumin: 2.6 g/dL — ABNORMAL LOW (ref 3.5–5.0)
Alkaline Phosphatase: 46 U/L (ref 38–126)
Anion gap: 12 (ref 5–15)
BUN: 26 mg/dL — ABNORMAL HIGH (ref 6–20)
CO2: 16 mmol/L — ABNORMAL LOW (ref 22–32)
Calcium: 7.9 mg/dL — ABNORMAL LOW (ref 8.9–10.3)
Chloride: 107 mmol/L (ref 98–111)
Creatinine, Ser: 2.42 mg/dL — ABNORMAL HIGH (ref 0.44–1.00)
GFR, Estimated: 23 mL/min — ABNORMAL LOW (ref >60)
Glucose, Bld: 94 mg/dL (ref 70–99)
Potassium: 3.4 mmol/L — ABNORMAL LOW (ref 3.5–5.1)
Sodium: 135 mmol/L (ref 135–145)
Total Bilirubin: 0.6 mg/dL (ref 0.3–1.2)
Total Protein: 5.5 g/dL — ABNORMAL LOW (ref 6.5–8.1)

## 2021-08-14 LAB — C DIFFICILE QUICK SCREEN W PCR REFLEX
C Diff antigen: POSITIVE — AB
C Diff toxin: NEGATIVE

## 2021-08-14 LAB — CLOSTRIDIUM DIFFICILE BY PCR, REFLEXED: Toxigenic C. Difficile by PCR: POSITIVE — AB

## 2021-08-14 LAB — GLUCOSE, CAPILLARY
Glucose-Capillary: 68 mg/dL — ABNORMAL LOW (ref 70–99)
Glucose-Capillary: 73 mg/dL (ref 70–99)
Glucose-Capillary: 131 mg/dL — ABNORMAL HIGH (ref 70–99)
Glucose-Capillary: 141 mg/dL — ABNORMAL HIGH (ref 70–99)
Glucose-Capillary: 98 mg/dL (ref 70–99)

## 2021-08-14 LAB — LACTIC ACID, PLASMA: Lactic Acid, Venous: 1.4 mmol/L (ref 0.5–1.9)

## 2021-08-14 LAB — MAGNESIUM: Magnesium: 1.2 mg/dL — ABNORMAL LOW (ref 1.7–2.4)

## 2021-08-14 LAB — HIV ANTIBODY (ROUTINE TESTING W REFLEX): HIV Screen 4th Generation wRfx: NONREACTIVE

## 2021-08-14 LAB — CK: Total CK: 103 U/L (ref 38–234)

## 2021-08-14 MED ORDER — MAGNESIUM SULFATE 2 GM/50ML IV SOLN
2.0000 g | Freq: Once | INTRAVENOUS | Status: AC
  Administered 2021-08-14: 2 g via INTRAVENOUS
  Filled 2021-08-14: qty 50

## 2021-08-14 MED ORDER — VANCOMYCIN 50 MG/ML ORAL SOLUTION
125.0000 mg | Freq: Four times a day (QID) | ORAL | Status: DC
  Filled 2021-08-14 (×2): qty 2.5

## 2021-08-14 MED ORDER — FIDAXOMICIN 200 MG PO TABS
200.0000 mg | ORAL_TABLET | Freq: Two times a day (BID) | ORAL | Status: DC
  Administered 2021-08-14 – 2021-08-15 (×2): 200 mg via ORAL
  Filled 2021-08-14 (×4): qty 1

## 2021-08-14 MED ORDER — POTASSIUM CHLORIDE CRYS ER 20 MEQ PO TBCR
40.0000 meq | EXTENDED_RELEASE_TABLET | Freq: Once | ORAL | Status: AC
  Administered 2021-08-14: 40 meq via ORAL
  Filled 2021-08-14: qty 2

## 2021-08-14 NOTE — Progress Notes (Signed)
[facility] Progress Note  Patient: Diana Herman    GYF:[id number]  DOA: 08/13/2021     Date of Service: the patient was seen and examined on 08/14/2021  Brief hospital course: Past medical history of hypertension, obesity, PMR, type II DM.  Presents with complaints of diarrhea and syncopal events. Currently plan is treat C. difficile and monitor for improvement in AKI.  Subjective: No nausea no vomiting.  Still continues to have diarrheal episode.  No abdominal pain.  No fever no chills.  No dizziness right now.  Assessment and Plan: 1.  Acute kidney injury From dehydration. Patient was having nausea vomiting and diarrhea on top of which she was taking her diuretics.  Also had severe hypotension likely from adrenal insufficiency. Currently renal function improving.  Continue with IV hydration.  Avoid nephrotoxic medication.  2.  Food poisoning  C. difficile colitis Patient is presentation seems to be consistent with food poisoning as she started having symptoms within hours of eating food that she does not normally. Although her C. difficile PCR is positive and therefore we will initiate her on C. difficile treatment. No abdominal tenderness. Avoid Imodium.  3.  Polymyalgia rheumatica. 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 Patient was given stress dose steroids with improvement in blood pressure. Continue current regimen  4.  OSA on CPAP Continue therapy  5.  Hypertension Hypotension on presentation Blood pressure medication currently on hold.  6.  Type II diabetes mellitus On sliding scale insulin.  7.  Morbid obesity Placing the patient at high risk for poor outcome. Body mass index is 41.82 kg/m.        DVT Prophylaxis:   heparin injection 5,000 Units Start: 08/13/21 2215 SCDs Start: 08/13/21 2128    Pt is Full code.  Communication: no family was present at bedside, at the time  of interview.   Data Reviewed: I have personally reviewed and interpreted daily labs, tele strips, imaging. CBG low in the morning.  Hypokalemia.  Serum creatinine improving but not back to normal.  Lactic acid normal.  Hypomagnesemia.  C. difficile positive.  Physical Exam:  General: Appear in mild distress, no Rash; Oral Mucosa Clear, moist. no Abnormal Neck Mass Or lumps, Conjunctiva normal  Cardiovascular: S1 and S2 Present, no Murmur, Respiratory: good respiratory effort, Bilateral Air entry present and CTA, no Crackles, no wheezes Abdomen: Bowel Sound present, Soft and no tenderness Extremities: no Pedal edema Neurology: alert and oriented to time, place, and person affect appropriate. no new focal deficit Gait not checked due to patient safety concerns  Vitals:   08/13/21 2125 08/14/21 0354 08/14/21 1247 08/14/21 1844  BP: 105/76 103/60 129/68 137/76  Pulse: 92 93 91 90  Resp: 18 18 16 16   Temp: 98.4 F (36.9 C) 99.2 F (37.3 C) 97.8 F (36.6 C) 98.4 F (36.9 C)  TempSrc: Oral Oral Oral Oral  SpO2: 94% 95% 99% 97%  Weight: 114.6 kg 114 kg    Height: 5\' 5"  (1.651 m)       Disposition:     Time spent: 35 minutes. I reviewed all nursing notes, pharmacy notes, vitals, pertinent old records. I have discussed plan of care as described above with RN.  Author: Berle Mull, MD [facility] 08/14/2021 8:53 PM  To reach On-call, see care teams to locate the attending and reach out via [email or web address]. Between 7PM-7AM, please contact night-coverage If you still have difficulty reaching the attending provider, please  page the [facility] (Director on Call) for [facility] on amion for assistance.

## 2021-08-14 NOTE — Progress Notes (Addendum)
HOSPITAL MEDICINE OVERNIGHT EVENT NOTE    Notified by nursing that patient's lactic acid has increased to 6.2.  Labs recommending a repeat draw due to having to stick the patient multiple times to get the sample so it therefore may be inaccurate.  We will order stat repeat lactic acid now.  Vernelle Emerald  MD [facility]   ADDENDUM (9/26 4:30AM)  Repeat 1.4.   Sherryll Burger Daemian Gahm

## 2021-08-14 NOTE — Progress Notes (Signed)
Inpatient Diabetes Program Recommendations  AACE/ADA: New Consensus Statement on Inpatient Glycemic Control (2015)  Target Ranges:  Prepandial:   less than 140 mg/dL      Peak postprandial:   less than 180 mg/dL (1-2 hours)      Critically ill patients:  140 - 180 mg/dL   Lab Results  Component Value Date   GLUCAP 73 08/14/2021   HGBA1C 6.7 (H) 08/13/2021    Review of Glycemic Control Results for Diana Herman, Diana Herman (MRN [id number]) as of 08/14/2021 09:28  Ref. Range 08/13/2021 15:08 08/13/2021 21:28 08/14/2021 06:12 08/14/2021 06:36  Glucose-Capillary Latest Ref Range: 70 - 99 mg/dL 97 108 (H) 68 (L) 73   Diabetes history: DM 2 Outpatient Diabetes medications: metformin 1000 mg Daily Current orders for Inpatient glycemic control:  Novolog 0-15 units tid + hs  A1c 6.7% on 9/25 PO Prednisone 20 mg Daily  Inpatient Diabetes Program Recommendations:    With AKI decrease Novolog to at least sensitive 0-9 units tid + hs  Thanks,  Tama Headings RN, MSN, BC-ADM Inpatient Diabetes Coordinator Team Pager [phone] (8a-5p)

## 2021-08-14 NOTE — Progress Notes (Addendum)
Pharmacy Antibiotic Note  Diana Herman is a 55 y.o. female admitted on 08/13/2021 with N/VD.  Med hx includes DM2, polymyalgia rheumatica (on daily prednisone), HTN, obesity. Pt now with AKI and sx of food poisoning. Pharmacy has been consulted for C diff management, initial episode.  WBC 11.6, Tmax 99.2 F  Plan: Fidaxomicin 200 mg BID X 10 days (fidaxomicin has demonstrated an association with reduced 30-day recurrence rates, compared to vancomycin; this patient is immunosuppressed, on chronic prednisone therapy, which predisposes her to C diff recurrence) Request pharmacy advocate technician to run insurance information for this pt, complete prior authorizations and apply for patient assistance through the mfgr as needed Monitor WBC, Hgb (2% incidence of anemia, neutropenia, per [employer]), temp, clinical course  Height: 5\' 5"  (165.1 cm) Weight: 114 kg (251 lb 5.2 oz) IBW/kg (Calculated) : 57  Temp (24hrs), Avg:98.6 F (37 C), Min:97.8 F (36.6 C), Max:99.2 F (37.3 C)  Recent Labs  Lab 08/13/21 1348 08/13/21 1453 08/13/21 1715 08/13/21 2235 08/14/21 0159  WBC 18.0*  --   --   --   --   CREATININE 3.36* 3.40*  --   --  2.42*  LATICACIDVEN 4.0*  --  3.2* 6.2* 1.4    Estimated Creatinine Clearance: 33.9 mL/min (A) (by C-G formula based on SCr of 2.42 mg/dL (H)).    Allergies  Allergen Reactions   Other Other (See Comments)    Apri causes depression    Antimicrobials this admission: Cefepime 9/25-9/26 Vancomycin X 1 9/25 Fidaxomicin 9/26 >>  Microbiology results: 9/25 COVID, flu A, flu B: negative 9/25 HIV screen: negative 9/25 Bld cx X 2:  NGTD  Thank you for allowing pharmacy to be a part of this patient's care.  Gillermina Hu, PharmD, BCPS, [facility] Clinical Pharmacist 08/14/2021 4:31 PM

## 2021-08-15 ENCOUNTER — Other Ambulatory Visit (HOSPITAL_COMMUNITY): Payer: Self-pay

## 2021-08-15 DIAGNOSIS — N179 Acute kidney failure, unspecified: Secondary | ICD-10-CM | POA: Diagnosis not present

## 2021-08-15 LAB — CBC WITH DIFFERENTIAL/PLATELET
Abs Immature Granulocytes: 0.06 10*3/uL (ref 0.00–0.07)
Basophils Absolute: 0 10*3/uL (ref 0.0–0.1)
Basophils Relative: 0 %
Eosinophils Absolute: 0.1 10*3/uL (ref 0.0–0.5)
Eosinophils Relative: 1 %
HCT: 25.4 % — ABNORMAL LOW (ref 36.0–46.0)
Hemoglobin: 8.1 g/dL — ABNORMAL LOW (ref 12.0–15.0)
Immature Granulocytes: 1 %
Lymphocytes Relative: 20 %
Lymphs Abs: 2.2 10*3/uL (ref 0.7–4.0)
MCH: 27.2 pg (ref 26.0–34.0)
MCHC: 31.9 g/dL (ref 30.0–36.0)
MCV: 85.2 fL (ref 80.0–100.0)
Monocytes Absolute: 0.6 10*3/uL (ref 0.1–1.0)
Monocytes Relative: 6 %
Neutro Abs: 8.2 10*3/uL — ABNORMAL HIGH (ref 1.7–7.7)
Neutrophils Relative %: 72 %
Platelets: 258 10*3/uL (ref 150–400)
RBC: 2.98 MIL/uL — ABNORMAL LOW (ref 3.87–5.11)
RDW: 15.9 % — ABNORMAL HIGH (ref 11.5–15.5)
WBC: 11.2 10*3/uL — ABNORMAL HIGH (ref 4.0–10.5)
nRBC: 0 % (ref 0.0–0.2)

## 2021-08-15 LAB — BASIC METABOLIC PANEL
Anion gap: 9 (ref 5–15)
BUN: 11 mg/dL (ref 6–20)
CO2: 20 mmol/L — ABNORMAL LOW (ref 22–32)
Calcium: 8.3 mg/dL — ABNORMAL LOW (ref 8.9–10.3)
Chloride: 109 mmol/L (ref 98–111)
Creatinine, Ser: 1.06 mg/dL — ABNORMAL HIGH (ref 0.44–1.00)
GFR, Estimated: >60 mL/min (ref >60)
Glucose, Bld: 105 mg/dL — ABNORMAL HIGH (ref 70–99)
Potassium: 3.5 mmol/L (ref 3.5–5.1)
Sodium: 138 mmol/L (ref 135–145)

## 2021-08-15 LAB — MAGNESIUM: Magnesium: 1.9 mg/dL (ref 1.7–2.4)

## 2021-08-15 LAB — GLUCOSE, CAPILLARY
Glucose-Capillary: 94 mg/dL (ref 70–99)
Glucose-Capillary: 157 mg/dL — ABNORMAL HIGH (ref 70–99)
Glucose-Capillary: 125 mg/dL — ABNORMAL HIGH (ref 70–99)

## 2021-08-15 MED ORDER — VANCOMYCIN HCL 125 MG PO CAPS: 125.0000 mg | ORAL_CAPSULE | Freq: Four times a day (QID) | ORAL | 0 refills | Status: AC

## 2021-08-15 NOTE — TOC Benefit Eligibility Note (Signed)
Patient Teacher, English as a foreign language completed.    The patient is currently admitted and upon discharge could be taking Dificid 200 mg.  Requires Prior Authorization  The patient is insured through [employer] [place], [place] Patient Advocate Specialist [place] Team Direct Number: [phone]  Fax: [phone]

## 2021-08-15 NOTE — TOC Benefit Eligibility Note (Signed)
Patient Advocate Encounter   Received notification that prior authorization for Dificid 200 mg tab is required.   PA submitted on 08/15/2021 Key [id number] Status is pending       Lyndel Safe, CPhT Pharmacy Patient Advocate Specialist [facility] Antimicrobial Stewardship Team Direct Number: [phone]  Fax: [phone]

## 2021-08-16 ENCOUNTER — Other Ambulatory Visit (HOSPITAL_COMMUNITY): Payer: Self-pay

## 2021-08-16 NOTE — TOC Benefit Eligibility Note (Signed)
Patient Advocate Encounter  Prior Authorization for Dificid 200 mg has been approved.     Effective dates: 08/15/2021 through 08/24/2021  Patients co-pay is $0.00.     Lyndel Safe, [place] Patient Advocate Specialist [place] Antimicrobial Stewardship Team Direct Number: [phone]  Fax: [phone]

## 2021-08-18 LAB — CULTURE, BLOOD (ROUTINE X 2)
Culture: NO GROWTH
Culture: NO GROWTH
Special Requests: ADEQUATE
Special Requests: ADEQUATE

## 2021-08-24 NOTE — Discharge Summary (Signed)
[facility] Discharge Summary   Patient: Diana Herman UXN:[id number]  PCP: Wayland Salinas, MD  Date of admission: 08/13/2021   Date of discharge: 08/15/2021      Discharge Diagnoses:  Principal Problem:   AKI (acute kidney injury) ([place]) Active Problems:   Long term current use of systemic steroids   Morbid obesity ([place])   OSA on CPAP   Polymyalgia rheumatica ([facility])   Primary hypertension   Food poisoning   Nausea vomiting and diarrhea   Type 2 diabetes mellitus without complication ([place])   Admitted From: home Disposition:  Home   Recommendations for Outpatient Follow-up:  PCP: please follow up with PCP   Follow-up Information     Ryter-Brown, Shyrl Numbers, MD. Schedule an appointment as soon as possible for a visit in 1 week(s).   Specialty: Family Medicine Contact information: [place] [id number] [phone]                Discharge Instructions     Diet general   Complete by: As directed    Discharge instructions   Complete by: As directed    It is important that you read the instructions as well as go over your medication list with RN to help you understand your care after this hospitalization.  Please follow-up with PCP in 1-2 weeks.  Please note that we are unable to authorize any refills for discharge medications, once you are discharged. Thus, it is imperative that you return to your primary care physician (or establish a relationship with a primary care physician if you do not have one) for your care needs. So that they can reassess your need for medications and monitor your lab values.  Please request your primary care physician to go over all Hospital Tests and Procedure/Radiological results at the follow up. Please get all Hospital records sent to your PCP by signing hospital release before you go home.   Do not drive, operating heavy machinery, perform activities at heights, swimming or participation  in water activities or provide baby sitting services because you were admitted for syncope; until you have been seen by Primary Care Physician and are cleared to do such activities.  Do not take more than prescribed Pain, Sleep and Anxiety Medications.  You were cared for by a hospitalist during your hospital stay. If you have any questions about your discharge medications or the care you received while you were in the hospital after you are discharged, you can call the hospital unit/floor you were admitted to and ask to speak with the hospitalist who took care of you. Ask for Hospitalist on call, if the hospitalist that took care of you is not available. Once you are discharged, your primary care physician will help you with any further medical issues. You Must read complete instructions/literature along with all the possible adverse reactions/side effects for all the Medicines you take and that have been prescribed to you. Take any new Medicines after you have completely understood and accept all the possible adverse reactions/side effects. If you have smoked or chewed Tobacco, please STOP smoking. If you drink alcohol, please safely reduce the use. Do not drive, operating heavy machinery, perform activities at heights, swimming or participation in water activities or provide baby sitting services under influence.   Increase activity slowly   Complete by: As directed    No wound care   Complete by: As directed        Diet recommendation: Cardiac  diet  Activity: The patient is advised to gradually reintroduce usual activities, as tolerated  Discharge Condition: stable  Code Status: Full code   History of present illness: As per the H and P dictated on admission, "55 year old Caucasian female with a history of type 2 diabetes, history of polymyalgia rheumatica on chronic daily prednisone 20 mg, hypertension, morbid obesity who presents to the ER today with a overnight history of nausea  vomiting and diarrhea.  Patient states that she went to [place] [address]. yesterday evening for dinner.  This was about 6-7 o'clock.  She states that when she was driving home, she is had to get out of her car several times due to projectile vomiting.  Since she got home, she started having massive diarrhea.  Over the course the next 12 hours she continued to have nausea vomiting along with concomitant diarrhea.  Patient states he tried to drink some water but ended up throwing this back up.  By time her mother got to her this morning, the patient was feeling presyncopal every time she would try to stand up.     Patient brought to the ER.  Patient noted to be hypotensive on her first arrival .   With initial blood pressure of 81-54.  Code sepsis was started.   Patient given 3 L of fluid.  Patient is on chronic prednisone.  Patient given 100 mg of IV hydrocortisone.   Laboratory evaluation performed.  Initial lactic acid was 4.0.  Serum creatinine had increased to 3.36 with a BUN of 22 and a bicarbonate of 18.  After 3 L of fluid, lactic acid improved to 3.2.  Patient was no longer hypotensive.  She was awake and alert.  Repeat blood pressure had increased to 130/63.  Patient does have the urge to urinate now after 3 L.  Due to the patient's acute kidney injury, [facility] contacted for admission. "  Hospital Course:  Summary of her active problems in the hospital is as following. 1.  Acute kidney injury From dehydration. Patient was having nausea vomiting and diarrhea on top of which she was taking her diuretics.  Also had severe hypotension likely from adrenal insufficiency. Currently renal function improving.  treated with IV hydration.  Avoid nephrotoxic medication.   2.  Food poisoning  C. difficile colitis Patient is presentation seems to be consistent with food poisoning as she started having symptoms within hours of eating food that she does not normally. Although  her C. difficile PCR is positive and therefore we will initiate her on C. difficile treatment. Continue oral vancomycin No abdominal tenderness. Avoid Imodium.   3.  Polymyalgia rheumatica. 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 Patient was given stress dose steroids with improvement in blood pressure. Continue current regimen   4.  OSA on CPAP Continue therapy   5.  Hypertension Hypotension on presentation Blood pressure medication currently on hold.   6.  Type II diabetes mellitus On sliding scale insulin.   7.  Morbid obesity Placing the patient at high risk for poor outcome. Body mass index is 40.92 kg/m.   Patient was ambulatory without any assistance. On the day of the discharge the patient's vitals were stable, and no other new acute medical condition were reported. The patient was felt safe to be discharge at Home with no therapy needed on discharge.  Consultants: none Procedures: noen  DISCHARGE MEDICATION: Allergies as of 08/15/2021  Reactions   Other Other (See Comments)   Apri causes depression        Medication List     STOP taking these medications    losartan 25 MG tablet Commonly known as: COZAAR   VITAMIN D PO       TAKE these medications    alendronate 35 MG tablet Commonly known as: FOSAMAX Take 1 tablet by mouth every 7 (seven) days.   buPROPion 150 MG 24 hr tablet Commonly known as: WELLBUTRIN XL Take 150 mg by mouth daily.   Calcium-Vitamin D 500-400 MG-UNIT Tabs Take 1 tablet by mouth daily.   clonazePAM 0.5 MG tablet Commonly known as: KLONOPIN Take 0.5 mg by mouth 2 (two) times daily as needed for anxiety.   DULoxetine 60 MG capsule Commonly known as: CYMBALTA Take 2 capsules by mouth daily.   eszopiclone 2 MG Tabs tablet Commonly known as: LUNESTA Take 2 mg by mouth at bedtime.   gabapentin 300 MG capsule Commonly known as: NEURONTIN Take  300 mg by mouth daily.   metFORMIN 500 MG tablet Commonly known as: GLUCOPHAGE Take 1,000 mg by mouth daily with breakfast.   pilocarpine 5 MG tablet Commonly known as: SALAGEN Take 5 mg by mouth daily.   predniSONE 10 MG tablet Commonly known as: DELTASONE Take 20 mg by mouth daily.   rosuvastatin 10 MG tablet Commonly known as: CRESTOR Take 10 mg by mouth at bedtime.   sertraline 100 MG tablet Commonly known as: ZOLOFT Take 1 tablet by mouth daily.   traZODone 50 MG tablet Commonly known as: DESYREL Take 50-100 mg by mouth at bedtime as needed for sleep.   vancomycin 125 MG capsule Commonly known as: Vancocin Take 1 capsule (125 mg total) by mouth 4 (four) times daily for 9 days.        Discharge Exam: Filed Weights   08/13/21 2125 08/14/21 0354 08/15/21 0508  Weight: 114.6 kg 114 kg 111.5 kg   Vitals:   08/15/21 1118 08/15/21 1700  BP: 114/66 (!) 163/98  Pulse: 75 87  Resp: 18 18  Temp: 98.3 F (36.8 C) 98.5 F (36.9 C)  SpO2: 100% 100%   General: Appear in mild distress, no Rash; Oral Mucosa Clear, moist. no Abnormal Neck Mass Or lumps, Conjunctiva normal  Cardiovascular: S1 and S2 Present, no Murmur, Respiratory: good respiratory effort, Bilateral Air entry present and CTA, no Crackles, no wheezes Abdomen: Bowel Sound present, Soft and no tenderness Extremities: no Pedal edema Neurology: alert and oriented to time, place, and person affect appropriate. no new focal deficit Gait not checked due to patient safety concerns  The results of significant diagnostics from this hospitalization (including imaging, microbiology, ancillary and laboratory) are listed below for reference.    Significant Diagnostic Studies: CT Head Wo Contrast  Result Date: 08/13/2021 CLINICAL DATA:  Pt BIB [facility] from home after her brother found her unresponsive on the floor, she reports est. 4 syncopal episodes w/LOC in the past 2 days. Pt has had N/V/D for 2 days. EMS reports  the pt was diaphoretic upon arrival with BP of 70/40. Pt is not on blood thinners EXAM: CT HEAD WITHOUT CONTRAST CT CERVICAL SPINE WITHOUT CONTRAST TECHNIQUE: Multidetector CT imaging of the head and cervical spine was performed following the standard protocol without intravenous contrast. Multiplanar CT image reconstructions of the cervical spine were also generated. COMPARISON:  None. FINDINGS: CT HEAD FINDINGS Brain: No evidence of acute infarction, hemorrhage, hydrocephalus, extra-axial collection or mass lesion/mass effect.  Vascular: No hyperdense vessel or unexpected calcification. Skull: Normal. Negative for fracture or focal lesion. Sinuses/Orbits: Normal globes and orbits.  Clear sinuses. Other: None. CT CERVICAL SPINE FINDINGS Alignment: Normal. Skull base and vertebrae: No acute fracture. No primary bone lesion or focal pathologic process. Soft tissues and spinal canal: No prevertebral fluid or swelling. No visible canal hematoma. Disc levels: Disc spaces, central spinal canal and neural foramina are well preserved. No evidence of a disc herniation. Small anterior endplate osteophytes, C3 through C7. No other degenerative change. Upper chest: Negative. Other: None. IMPRESSION: HEAD CT 1. Normal. CERVICAL CT 1. No fracture or acute finding. Electronically Signed   By: Lajean Manes M.D.   On: 08/13/2021 14:18   CT Cervical Spine Wo Contrast  Result Date: 08/13/2021 CLINICAL DATA:  Pt BIB [facility] from home after her brother found her unresponsive on the floor, she reports est. 4 syncopal episodes w/LOC in the past 2 days. Pt has had N/V/D for 2 days. EMS reports the pt was diaphoretic upon arrival with BP of 70/40. Pt is not on blood thinners EXAM: CT HEAD WITHOUT CONTRAST CT CERVICAL SPINE WITHOUT CONTRAST TECHNIQUE: Multidetector CT imaging of the head and cervical spine was performed following the standard protocol without intravenous contrast. Multiplanar CT image reconstructions of the cervical spine  were also generated. COMPARISON:  None. FINDINGS: CT HEAD FINDINGS Brain: No evidence of acute infarction, hemorrhage, hydrocephalus, extra-axial collection or mass lesion/mass effect. Vascular: No hyperdense vessel or unexpected calcification. Skull: Normal. Negative for fracture or focal lesion. Sinuses/Orbits: Normal globes and orbits.  Clear sinuses. Other: None. CT CERVICAL SPINE FINDINGS Alignment: Normal. Skull base and vertebrae: No acute fracture. No primary bone lesion or focal pathologic process. Soft tissues and spinal canal: No prevertebral fluid or swelling. No visible canal hematoma. Disc levels: Disc spaces, central spinal canal and neural foramina are well preserved. No evidence of a disc herniation. Small anterior endplate osteophytes, C3 through C7. No other degenerative change. Upper chest: Negative. Other: None. IMPRESSION: HEAD CT 1. Normal. CERVICAL CT 1. No fracture or acute finding. Electronically Signed   By: Lajean Manes M.D.   On: 08/13/2021 14:18   DG Chest Port 1 View  Result Date: 08/13/2021 CLINICAL DATA:  Weakness.  Chest pain and shortness of breath. EXAM: PORTABLE CHEST 1 VIEW COMPARISON:  None. FINDINGS: Cardiac silhouette is normal in size. No mediastinal or hilar masses. Clear lungs. No pleural effusion or pneumothorax. Skeletal structures are grossly intact. IMPRESSION: No active disease. Electronically Signed   By: Lajean Manes M.D.   On: 08/13/2021 14:43    Microbiology: Recent Results (from the past 240 hour(s))  C Difficile Quick Screen w PCR reflex     Status: Abnormal   Collection Time: 08/14/21  1:28 PM   Specimen: STOOL  Result Value Ref Range Status   C Diff antigen POSITIVE (A) NEGATIVE Final   C Diff toxin NEGATIVE NEGATIVE Final   C Diff interpretation Results are indeterminate. See PCR results.  Final    Comment: Performed at [facility], [place] [address] [place], [place] [address]  C. Diff by PCR, Reflexed     Status: Abnormal   Collection  Time: 08/14/21  1:28 PM  Result Value Ref Range Status   Toxigenic C. Difficile by PCR POSITIVE (A) NEGATIVE Final    Comment: Positive for toxigenic C. difficile with little to no toxin production. Only treat if clinical presentation suggests symptomatic illness. Performed at [facility],  [address] [place], [place] [address]      Labs: CBC: No results for input(s): WBC, NEUTROABS, HGB, HCT, MCV, PLT in the last 168 hours. Basic Metabolic Panel: No results for input(s): NA, K, CL, CO2, GLUCOSE, BUN, CREATININE, CALCIUM, MG, PHOS in the last 168 hours. Liver Function Tests: No results for input(s): AST, ALT, ALKPHOS, BILITOT, PROT, ALBUMIN in the last 168 hours. CBG: No results for input(s): GLUCAP in the last 168 hours.  Time spent: 35 minutes  Signed:  Berle Mull  [facility] 08/15/2021

## 2021-09-19 DIAGNOSIS — A0471 Enterocolitis due to Clostridium difficile, recurrent: Secondary | ICD-10-CM

## 2021-09-19 HISTORY — DX: Enterocolitis due to Clostridium difficile, recurrent: A04.71

## 2021-10-15 ENCOUNTER — Emergency Department (HOSPITAL_COMMUNITY): Payer: BC Managed Care – PPO

## 2021-10-15 ENCOUNTER — Other Ambulatory Visit: Payer: Self-pay

## 2021-10-15 ENCOUNTER — Encounter (HOSPITAL_COMMUNITY): Payer: Self-pay | Admitting: Emergency Medicine

## 2021-10-15 ENCOUNTER — Inpatient Hospital Stay (HOSPITAL_COMMUNITY)
Admission: EM | Admit: 2021-10-15 | Discharge: 2021-10-17 | DRG: 872 | Disposition: A | Discharge status: Home / Self Care | Payer: BC Managed Care – PPO | Attending: Internal Medicine | Admitting: Internal Medicine

## 2021-10-15 DIAGNOSIS — Z833 Family history of diabetes mellitus: Secondary | ICD-10-CM

## 2021-10-15 DIAGNOSIS — A419 Sepsis, unspecified organism: Secondary | ICD-10-CM | POA: Diagnosis present

## 2021-10-15 DIAGNOSIS — R197 Diarrhea, unspecified: Secondary | ICD-10-CM

## 2021-10-15 DIAGNOSIS — Z7952 Long term (current) use of systemic steroids: Secondary | ICD-10-CM

## 2021-10-15 DIAGNOSIS — R6520 Severe sepsis without septic shock: Secondary | ICD-10-CM | POA: Diagnosis not present

## 2021-10-15 DIAGNOSIS — E872 Acidosis, unspecified: Secondary | ICD-10-CM | POA: Diagnosis present

## 2021-10-15 DIAGNOSIS — Z8249 Family history of ischemic heart disease and other diseases of the circulatory system: Secondary | ICD-10-CM

## 2021-10-15 DIAGNOSIS — I959 Hypotension, unspecified: Secondary | ICD-10-CM | POA: Diagnosis present

## 2021-10-15 DIAGNOSIS — Z90710 Acquired absence of both cervix and uterus: Secondary | ICD-10-CM

## 2021-10-15 DIAGNOSIS — I10 Essential (primary) hypertension: Secondary | ICD-10-CM | POA: Diagnosis present

## 2021-10-15 DIAGNOSIS — A0471 Enterocolitis due to Clostridium difficile, recurrent: Secondary | ICD-10-CM | POA: Diagnosis present

## 2021-10-15 DIAGNOSIS — E782 Mixed hyperlipidemia: Secondary | ICD-10-CM | POA: Diagnosis present

## 2021-10-15 DIAGNOSIS — A414 Sepsis due to anaerobes: Principal | ICD-10-CM | POA: Diagnosis present

## 2021-10-15 DIAGNOSIS — F418 Other specified anxiety disorders: Secondary | ICD-10-CM | POA: Diagnosis not present

## 2021-10-15 DIAGNOSIS — K219 Gastro-esophageal reflux disease without esophagitis: Secondary | ICD-10-CM | POA: Diagnosis present

## 2021-10-15 DIAGNOSIS — Z7984 Long term (current) use of oral hypoglycemic drugs: Secondary | ICD-10-CM

## 2021-10-15 DIAGNOSIS — E119 Type 2 diabetes mellitus without complications: Secondary | ICD-10-CM | POA: Diagnosis present

## 2021-10-15 DIAGNOSIS — E860 Dehydration: Secondary | ICD-10-CM | POA: Diagnosis present

## 2021-10-15 DIAGNOSIS — Z20822 Contact with and (suspected) exposure to covid-19: Secondary | ICD-10-CM | POA: Diagnosis present

## 2021-10-15 DIAGNOSIS — M353 Polymyalgia rheumatica: Secondary | ICD-10-CM | POA: Diagnosis present

## 2021-10-15 DIAGNOSIS — Z9989 Dependence on other enabling machines and devices: Secondary | ICD-10-CM

## 2021-10-15 DIAGNOSIS — Z807 Family history of other malignant neoplasms of lymphoid, hematopoietic and related tissues: Secondary | ICD-10-CM | POA: Diagnosis not present

## 2021-10-15 DIAGNOSIS — Z8542 Personal history of malignant neoplasm of other parts of uterus: Secondary | ICD-10-CM

## 2021-10-15 DIAGNOSIS — F32A Depression, unspecified: Secondary | ICD-10-CM | POA: Diagnosis present

## 2021-10-15 DIAGNOSIS — E876 Hypokalemia: Secondary | ICD-10-CM | POA: Diagnosis present

## 2021-10-15 DIAGNOSIS — E861 Hypovolemia: Secondary | ICD-10-CM | POA: Diagnosis present

## 2021-10-15 DIAGNOSIS — Z7983 Long term (current) use of bisphosphonates: Secondary | ICD-10-CM | POA: Diagnosis not present

## 2021-10-15 DIAGNOSIS — Z6841 Body Mass Index (BMI) 40.0 and over, adult: Secondary | ICD-10-CM

## 2021-10-15 DIAGNOSIS — G4733 Obstructive sleep apnea (adult) (pediatric): Secondary | ICD-10-CM

## 2021-10-15 DIAGNOSIS — N179 Acute kidney failure, unspecified: Secondary | ICD-10-CM | POA: Diagnosis not present

## 2021-10-15 DIAGNOSIS — Z823 Family history of stroke: Secondary | ICD-10-CM

## 2021-10-15 DIAGNOSIS — Z79899 Other long term (current) drug therapy: Secondary | ICD-10-CM

## 2021-10-15 HISTORY — DX: Sepsis, unspecified organism: A41.9

## 2021-10-15 HISTORY — DX: Sepsis, unspecified organism: R65.20

## 2021-10-15 LAB — COMPREHENSIVE METABOLIC PANEL
ALT: 29 U/L (ref 0–44)
AST: 22 U/L (ref 15–41)
Albumin: 4.5 g/dL (ref 3.5–5.0)
Alkaline Phosphatase: 101 U/L (ref 38–126)
Anion gap: 17 — ABNORMAL HIGH (ref 5–15)
BUN: 13 mg/dL (ref 6–20)
CO2: 18 mmol/L — ABNORMAL LOW (ref 22–32)
Calcium: 10.5 mg/dL — ABNORMAL HIGH (ref 8.9–10.3)
Chloride: 104 mmol/L (ref 98–111)
Creatinine, Ser: 1.56 mg/dL — ABNORMAL HIGH (ref 0.44–1.00)
GFR, Estimated: 40 mL/min — ABNORMAL LOW (ref >60)
Glucose, Bld: 226 mg/dL — ABNORMAL HIGH (ref 70–99)
Potassium: 3.3 mmol/L — ABNORMAL LOW (ref 3.5–5.1)
Sodium: 139 mmol/L (ref 135–145)
Total Bilirubin: 0.4 mg/dL (ref 0.3–1.2)
Total Protein: 8.3 g/dL — ABNORMAL HIGH (ref 6.5–8.1)

## 2021-10-15 LAB — CBC WITH DIFFERENTIAL/PLATELET
Abs Immature Granulocytes: 0.1 10*3/uL — ABNORMAL HIGH (ref 0.00–0.07)
Basophils Absolute: 0.1 10*3/uL (ref 0.0–0.1)
Basophils Relative: 1 %
Eosinophils Absolute: 0 10*3/uL (ref 0.0–0.5)
Eosinophils Relative: 0 %
HCT: 48.6 % — ABNORMAL HIGH (ref 36.0–46.0)
Hemoglobin: 14.7 g/dL (ref 12.0–15.0)
Immature Granulocytes: 1 %
Lymphocytes Relative: 14 %
Lymphs Abs: 2.7 10*3/uL (ref 0.7–4.0)
MCH: 27.8 pg (ref 26.0–34.0)
MCHC: 30.2 g/dL (ref 30.0–36.0)
MCV: 91.9 fL (ref 80.0–100.0)
Monocytes Absolute: 0.6 10*3/uL (ref 0.1–1.0)
Monocytes Relative: 3 %
Neutro Abs: 15.1 10*3/uL — ABNORMAL HIGH (ref 1.7–7.7)
Neutrophils Relative %: 81 %
Platelets: 487 10*3/uL — ABNORMAL HIGH (ref 150–400)
RBC: 5.29 MIL/uL — ABNORMAL HIGH (ref 3.87–5.11)
RDW: 17.2 % — ABNORMAL HIGH (ref 11.5–15.5)
WBC: 18.6 10*3/uL — ABNORMAL HIGH (ref 4.0–10.5)
nRBC: 0.2 % (ref 0.0–0.2)

## 2021-10-15 LAB — URINALYSIS, ROUTINE W REFLEX MICROSCOPIC
Bilirubin Urine: NEGATIVE
Glucose, UA: NEGATIVE mg/dL
Ketones, ur: NEGATIVE mg/dL
Nitrite: NEGATIVE
Protein, ur: 30 mg/dL — AB
Specific Gravity, Urine: 1.033 — ABNORMAL HIGH (ref 1.005–1.030)
pH: 5 (ref 5.0–8.0)

## 2021-10-15 LAB — LACTIC ACID, PLASMA: Lactic Acid, Venous: >9 mmol/L (ref 0.5–1.9)

## 2021-10-15 LAB — CBG MONITORING, ED: Glucose-Capillary: 166 mg/dL — ABNORMAL HIGH (ref 70–99)

## 2021-10-15 LAB — I-STAT BETA HCG BLOOD, ED (MC, WL, AP ONLY): I-stat hCG, quantitative: <5 m[IU]/mL (ref <5)

## 2021-10-15 LAB — RESP PANEL BY RT-PCR (FLU A&B, COVID) ARPGX2
Influenza A by PCR: NEGATIVE
Influenza B by PCR: NEGATIVE
SARS Coronavirus 2 by RT PCR: NEGATIVE

## 2021-10-15 MED ORDER — VANCOMYCIN HCL 125 MG PO CAPS
125.0000 mg | ORAL_CAPSULE | Freq: Four times a day (QID) | ORAL | Status: DC
  Administered 2021-10-15 – 2021-10-17 (×7): 125 mg via ORAL
  Filled 2021-10-15 (×9): qty 1

## 2021-10-15 MED ORDER — ONDANSETRON HCL 4 MG/2ML IJ SOLN: 4.0000 mg | Freq: Four times a day (QID) | INTRAMUSCULAR | Status: DC | PRN

## 2021-10-15 MED ORDER — INSULIN ASPART 100 UNIT/ML IJ SOLN
0.0000 [IU] | INTRAMUSCULAR | Status: DC
  Administered 2021-10-15: 21:00:00 3 [IU] via SUBCUTANEOUS

## 2021-10-15 MED ORDER — LACTATED RINGERS IV SOLN: INTRAVENOUS | Status: DC

## 2021-10-15 MED ORDER — CEFEPIME HCL 2 G IJ SOLR
2.0000 g | Freq: Once | INTRAMUSCULAR | Status: AC
  Administered 2021-10-15: 17:00:00 2 g via INTRAVENOUS
  Filled 2021-10-15: qty 2

## 2021-10-15 MED ORDER — SODIUM CHLORIDE 0.9 % IV SOLN: INTRAVENOUS | Status: AC

## 2021-10-15 MED ORDER — VANCOMYCIN HCL 125 MG PO CAPS: 125.0000 mg | ORAL_CAPSULE | ORAL | Status: DC

## 2021-10-15 MED ORDER — IOHEXOL 300 MG/ML  SOLN
100.0000 mL | Freq: Once | INTRAMUSCULAR | Status: AC | PRN
  Administered 2021-10-15: 17:00:00 100 mL via INTRAVENOUS

## 2021-10-15 MED ORDER — VANCOMYCIN HCL 2000 MG/400ML IV SOLN
2000.0000 mg | Freq: Once | INTRAVENOUS | Status: DC
  Filled 2021-10-15 (×2): qty 400

## 2021-10-15 MED ORDER — HYDROXYCHLOROQUINE SULFATE 200 MG PO TABS
200.0000 mg | ORAL_TABLET | Freq: Two times a day (BID) | ORAL | Status: DC
  Administered 2021-10-16 – 2021-10-17 (×4): 200 mg via ORAL
  Filled 2021-10-15 (×5): qty 1

## 2021-10-15 MED ORDER — ACETAMINOPHEN 325 MG PO TABS
650.0000 mg | ORAL_TABLET | Freq: Four times a day (QID) | ORAL | Status: DC | PRN
  Administered 2021-10-16 – 2021-10-17 (×2): 650 mg via ORAL
  Filled 2021-10-15 (×2): qty 2

## 2021-10-15 MED ORDER — GABAPENTIN 300 MG PO CAPS
300.0000 mg | ORAL_CAPSULE | Freq: Every day | ORAL | Status: DC
  Administered 2021-10-16 (×2): 300 mg via ORAL
  Filled 2021-10-15 (×2): qty 1

## 2021-10-15 MED ORDER — ENOXAPARIN SODIUM 40 MG/0.4ML IJ SOSY
40.0000 mg | PREFILLED_SYRINGE | INTRAMUSCULAR | Status: DC
  Administered 2021-10-15 – 2021-10-16 (×2): 40 mg via SUBCUTANEOUS
  Filled 2021-10-15 (×2): qty 0.4

## 2021-10-15 MED ORDER — CLONAZEPAM 0.5 MG PO TABS: 0.5000 mg | ORAL_TABLET | Freq: Two times a day (BID) | ORAL | Status: DC | PRN

## 2021-10-15 MED ORDER — SODIUM CHLORIDE 0.9 % IV BOLUS
2000.0000 mL | Freq: Once | INTRAVENOUS | Status: AC
  Administered 2021-10-15: 17:00:00 2000 mL via INTRAVENOUS

## 2021-10-15 MED ORDER — DULOXETINE HCL 60 MG PO CPEP
120.0000 mg | ORAL_CAPSULE | Freq: Every day | ORAL | Status: DC
  Administered 2021-10-16 – 2021-10-17 (×2): 120 mg via ORAL
  Filled 2021-10-15 (×2): qty 2

## 2021-10-15 MED ORDER — ONDANSETRON HCL 4 MG PO TABS: 4.0000 mg | ORAL_TABLET | Freq: Four times a day (QID) | ORAL | Status: DC | PRN

## 2021-10-15 MED ORDER — VANCOMYCIN HCL 125 MG PO CAPS: 125.0000 mg | ORAL_CAPSULE | Freq: Two times a day (BID) | ORAL | Status: DC

## 2021-10-15 MED ORDER — VANCOMYCIN HCL 125 MG PO CAPS: 125.0000 mg | ORAL_CAPSULE | Freq: Every day | ORAL | Status: DC

## 2021-10-15 MED ORDER — ONDANSETRON 4 MG PO TBDP
4.0000 mg | ORAL_TABLET | Freq: Once | ORAL | Status: AC
  Administered 2021-10-15: 17:00:00 4 mg via ORAL
  Filled 2021-10-15: qty 1

## 2021-10-15 MED ORDER — PREDNISONE 20 MG PO TABS
20.0000 mg | ORAL_TABLET | Freq: Every day | ORAL | Status: DC
  Administered 2021-10-16 – 2021-10-17 (×2): 20 mg via ORAL
  Filled 2021-10-15 (×2): qty 1

## 2021-10-15 MED ORDER — ROSUVASTATIN CALCIUM 5 MG PO TABS
10.0000 mg | ORAL_TABLET | Freq: Every day | ORAL | Status: DC
  Administered 2021-10-16 (×2): 10 mg via ORAL
  Filled 2021-10-15 (×2): qty 2

## 2021-10-15 MED ORDER — SODIUM CHLORIDE 0.9 % IV BOLUS
1000.0000 mL | Freq: Once | INTRAVENOUS | Status: AC
  Administered 2021-10-15: 1000 mL via INTRAVENOUS

## 2021-10-15 MED ORDER — SODIUM CHLORIDE 0.9% FLUSH
3.0000 mL | Freq: Two times a day (BID) | INTRAVENOUS | Status: DC
  Administered 2021-10-15 – 2021-10-17 (×4): 3 mL via INTRAVENOUS

## 2021-10-15 MED ORDER — TRAZODONE HCL 50 MG PO TABS
75.0000 mg | ORAL_TABLET | Freq: Every day | ORAL | Status: DC
  Administered 2021-10-16 (×2): 75 mg via ORAL
  Filled 2021-10-15 (×2): qty 2

## 2021-10-15 MED ORDER — POTASSIUM CHLORIDE 10 MEQ/100ML IV SOLN
10.0000 meq | INTRAVENOUS | Status: AC
  Administered 2021-10-15 – 2021-10-16 (×3): 10 meq via INTRAVENOUS
  Filled 2021-10-15 (×3): qty 100

## 2021-10-15 MED ORDER — ACETAMINOPHEN 650 MG RE SUPP: 650.0000 mg | Freq: Four times a day (QID) | RECTAL | Status: DC | PRN

## 2021-10-15 NOTE — ED Provider Notes (Signed)
[facility] EMERGENCY DEPARTMENT Provider Note   CSN: [id number] Arrival date & time: 10/15/21  1403     History Chief Complaint  Patient presents with   Abdominal Pain    Klaire ELONA YINGER is a 55 y.o. female with a history of C. difficile colitis in September 2022, presenting to the emergency department with diarrhea, nausea, vomiting, dehydration.  Patient reports that she had the symptoms for perhaps a few days, the diarrhea began today.  Took antibiotics for C Diff 2 months ago with resolution of symptoms; no further antibiotics.  HPI     Past Medical History:  Diagnosis Date   Allergy    Anemia    Anxiety    Cataract    forming    Depression    DM (diabetes mellitus) ([place])    GERD (gastroesophageal reflux disease)    past    Hx of adenomatous colonic polyps 04/28/2021   Hyperlipidemia    Hypertension    Insomnia    Morbid obesity (HCC)    OA (osteoarthritis)    OSA (obstructive sleep apnea)    on CPAP   Polymyalgia rheumatica (HCC)    Sleep apnea    wears cpap     Patient Active Problem List   Diagnosis Date Noted   Severe sepsis with acute organ dysfunction ([place]) 10/15/2021   Hypotension 10/15/2021   Hypercalcemia 10/15/2021   Hypokalemia 10/15/2021   Depression with anxiety 10/15/2021   AKI (acute kidney injury) ([place]) 08/13/2021   Food poisoning 08/13/2021   Nausea vomiting and diarrhea 08/13/2021   Type 2 diabetes mellitus without complication ([place]) 00/76/2263   Hx of adenomatous colonic polyps 04/28/2021   Long term current use of systemic steroids 09/21/2020   Myalgia 09/21/2020   Primary hypertension 08/30/2020   Postmenopausal bleeding 03/17/2020   Morbid obesity ([place]) 01/01/2019   Achilles rupture, right 08/18/2018   Polymyalgia rheumatica ([place]) 08/27/2017   Primary osteoarthritis of left knee 06/30/2016   Primary osteoarthritis of right knee 06/30/2016   Anxiety 01/17/2016   Impaired glucose tolerance 01/17/2016    Mixed hyperlipidemia 01/17/2016   OSA on CPAP 01/17/2016    Past Surgical History:  Procedure Laterality Date   ACHILLES TENDON SURGERY Right    COLONOSCOPY  04/14/2021   [facility]   hysteroscopy and polypectomy     TONSILLECTOMY     WISDOM TOOTH EXTRACTION     early 90's      OB History   No obstetric history on file.     Family History  Problem Relation Age of Onset   Colon polyps Mother    Depression Mother    Hypertension Mother    Hyperlipidemia Mother    Diabetes Father    Alcoholism Father    Pancreatitis Father    Non-Hodgkin's lymphoma Maternal Grandmother    Heart attack Maternal Grandmother    Depression Maternal Grandmother    Hypertension Maternal Grandmother    Hyperlipidemia Maternal Grandmother    Diabetes Maternal Grandfather    Heart disease Maternal Grandfather    Hypertension Maternal Grandfather    Alcoholism Paternal Grandmother    Diverticulitis Paternal Grandmother    Heart disease Maternal Uncle    Stroke Maternal Uncle    Colon cancer Neg Hx    Esophageal cancer Neg Hx    Rectal cancer Neg Hx    Stomach cancer Neg Hx     Social History   Tobacco Use   Smoking status: Never  Smokeless tobacco: Never  Vaping Use   Vaping Use: Never used  Substance Use Topics   Alcohol use: Yes    Comment: once a year   Drug use: Never    Home Medications Prior to Admission medications   Medication Sig Start Date End Date Taking? Authorizing Provider  alendronate (FOSAMAX) 35 MG tablet Take 1 tablet by mouth every 7 (seven) days. 05/30/20   [provider]  buPROPion (WELLBUTRIN XL) 150 MG 24 hr tablet Take 150 mg by mouth daily. 06/26/21   [provider]  Calcium Carb-Cholecalciferol (CALCIUM-VITAMIN D) 500-400 MG-UNIT TABS Take 1 tablet by mouth daily.    [provider]  clonazePAM (KLONOPIN) 0.5 MG tablet Take 0.5 mg by mouth 2 (two) times daily as needed for anxiety. 07/19/08   [provider]   DULoxetine (CYMBALTA) 60 MG capsule Take 2 capsules by mouth daily. 05/30/20   [provider]  eszopiclone (LUNESTA) 2 MG TABS tablet Take 2 mg by mouth at bedtime. 01/04/21   [provider]  gabapentin (NEURONTIN) 300 MG capsule Take 300 mg by mouth daily. 01/18/21   [provider]  metFORMIN (GLUCOPHAGE) 500 MG tablet Take 1,000 mg by mouth daily with breakfast. 08/31/20   [provider]  pilocarpine (SALAGEN) 5 MG tablet Take 5 mg by mouth daily. 12/19/20 12/19/21  [provider]  predniSONE (DELTASONE) 10 MG tablet Take 20 mg by mouth daily.  08/08/20   [provider]  rosuvastatin (CRESTOR) 10 MG tablet Take 10 mg by mouth at bedtime. 07/10/21   [provider]  sertraline (ZOLOFT) 100 MG tablet Take 1 tablet by mouth daily. 07/11/20   [provider]  traZODone (DESYREL) 50 MG tablet Take 50-100 mg by mouth at bedtime as needed for sleep. 05/30/20   [provider]    Allergies    Other  Review of Systems   Review of Systems  Constitutional:  Positive for chills and fever.  HENT:  Negative for ear pain and sore throat.   Eyes:  Negative for pain and visual disturbance.  Respiratory:  Negative for cough and shortness of breath.   Cardiovascular:  Negative for chest pain and palpitations.  Gastrointestinal:  Positive for abdominal pain, diarrhea, nausea and vomiting.  Genitourinary:  Negative for dysuria and hematuria.  Musculoskeletal:  Negative for arthralgias and back pain.  Skin:  Negative for color change and rash.  Neurological:  Positive for light-headedness. Negative for syncope.  All other systems reviewed and are negative.  Physical Exam Updated Vital Signs BP (!) 144/78   Pulse (!) 117   Temp 98.9 F (37.2 C)   Resp (!) 24   Ht 5\' 5"  (1.651 m)   Wt 111.5 kg   LMP 03/21/2012   SpO2 100%   BMI 40.91 kg/m   Physical Exam Constitutional:      General: She is not in acute  distress. HENT:     Head: Normocephalic and atraumatic.  Eyes:     Conjunctiva/sclera: Conjunctivae normal.     Pupils: Pupils are equal, round, and reactive to light.  Cardiovascular:     Rate and Rhythm: Regular rhythm. Tachycardia present.  Pulmonary:     Effort: Pulmonary effort is normal. No respiratory distress.  Abdominal:     General: There is no distension.     Tenderness: There is no abdominal tenderness.  Skin:    General: Skin is warm and dry.  Neurological:     General: No  focal deficit present.     Mental Status: She is alert. Mental status is at baseline.  Psychiatric:        Mood and Affect: Mood normal.        Behavior: Behavior normal.    ED Results / Procedures / Treatments   Labs (all labs ordered are listed, but only abnormal results are displayed) Labs Reviewed  COMPREHENSIVE METABOLIC PANEL - Abnormal; Notable for the following components:      Result Value   Potassium 3.3 (*)    CO2 18 (*)    Glucose, Bld 226 (*)    Creatinine, Ser 1.56 (*)    Calcium 10.5 (*)    Total Protein 8.3 (*)    GFR, Estimated 40 (*)    Anion gap 17 (*)    All other components within normal limits  LACTIC ACID, PLASMA - Abnormal; Notable for the following components:   Lactic Acid, Venous 4.4 (*)    All other components within normal limits  CBC WITH DIFFERENTIAL/PLATELET - Abnormal; Notable for the following components:   WBC 18.6 (*)    RBC 5.29 (*)    HCT 48.6 (*)    RDW 17.2 (*)    Platelets 487 (*)    Neutro Abs 15.1 (*)    Abs Immature Granulocytes 0.10 (*)    All other components within normal limits  CBG MONITORING, ED - Abnormal; Notable for the following components:   Glucose-Capillary 166 (*)    All other components within normal limits  RESP PANEL BY RT-PCR (FLU A&B, COVID) ARPGX2  CULTURE, BLOOD (ROUTINE X 2)  CULTURE, BLOOD (ROUTINE X 2)  C DIFFICILE QUICK SCREEN W PCR REFLEX    LACTIC ACID, PLASMA  URINALYSIS, ROUTINE W REFLEX MICROSCOPIC   PROTIME-INR  MAGNESIUM  PROCALCITONIN  MAGNESIUM  BASIC METABOLIC PANEL  CBC  I-STAT BETA HCG BLOOD, ED ([facility], [facility], [facility] ONLY)    EKG None  Radiology DG Chest 2 View  Result Date: 10/15/2021 CLINICAL DATA:  Nausea and vomiting EXAM: CHEST - 2 VIEW COMPARISON:  Chest x-ray 08/13/2021 FINDINGS: Heart size and mediastinal contours are within normal limits. No suspicious pulmonary opacities identified. No pleural effusion or pneumothorax visualized. No acute osseous abnormality appreciated. IMPRESSION: No acute intrathoracic process identified. Electronically Signed   By: Ofilia Neas M.D.   On: 10/15/2021 15:29   CT ABDOMEN PELVIS W CONTRAST  Result Date: 10/15/2021 CLINICAL DATA:  Abdominal pain, fever suspected C diff colitis evaluate for perforation EXAM: CT ABDOMEN AND PELVIS WITH CONTRAST TECHNIQUE: Multidetector CT imaging of the abdomen and pelvis was performed using the standard protocol following bolus administration of intravenous contrast. CONTRAST:  133mL OMNIPAQUE IOHEXOL 300 MG/ML  SOLN COMPARISON:  None. FINDINGS: Lower chest: No acute abnormality. Hepatobiliary: No focal liver abnormality. No gallstones, gallbladder wall thickening, or pericholecystic fluid. No biliary dilatation. Pancreas: No focal lesion. Normal pancreatic contour. No surrounding inflammatory changes. No main pancreatic ductal dilatation. Spleen: Normal in size without focal abnormality. Adrenals/Urinary Tract: No adrenal nodule bilaterally. Bilateral kidneys enhance symmetrically. Punctate calcifications within bilateral kidneys. No hydronephrosis. No hydroureter. The urinary bladder is decompressed. On delayed imaging, there is no urothelial wall thickening and there are no filling defects in the opacified portions of the bilateral collecting systems or ureters. Stomach/Bowel: Stomach is within normal limits. No evidence of bowel wall thickening or dilatation. Fluid throughout the lumen of the colon. No  associated bowel wall thickening or pericolonic fat stranding. No pneumatosis. Appendix appears in caliber  with no inflammatory changing changes. Appendicoliths is noted within the appendiceal lumen (3:73). Vascular/Lymphatic: No abdominal aorta or iliac aneurysm. Mild atherosclerotic plaque of the aorta and its branches. No abdominal, pelvic, or inguinal lymphadenopathy. Reproductive: Status post hysterectomy. No adnexal masses. Other: No intraperitoneal free fluid. No intraperitoneal free gas. No organized fluid collection. Musculoskeletal: Tiny fat containing umbilical hernia. No suspicious lytic or blastic osseous lesions. No acute displaced fracture. Multilevel degenerative changes of the spine. IMPRESSION: 1. Fast transition state of the colon. No associated bowel perforation or findings of inflammation. 2. Nonobstructive punctate bilateral nephrolithiasis. 3.  Aortic Atherosclerosis (ICD10-I70.0). Electronically Signed   By: Iven Finn M.D.   On: 10/15/2021 17:48    Procedures .Critical Care Performed by: Wyvonnia Dusky, MD Authorized by: Wyvonnia Dusky, MD   Critical care provider statement:    Critical care time (minutes):  45   Critical care time was exclusive of:  Separately billable procedures and treating other patients   Critical care was necessary to treat or prevent imminent or life-threatening deterioration of the following conditions:  Sepsis   Critical care was time spent personally by me on the following activities:  Ordering and performing treatments and interventions, ordering and review of laboratory studies, ordering and review of radiographic studies, pulse oximetry, review of old charts, examination of patient and evaluation of patient's response to treatment   Medications Ordered in ED Medications  lactated ringers infusion ( Intravenous New Bag/Given 10/15/21 1937)  enoxaparin (LOVENOX) injection 40 mg (40 mg Subcutaneous Given 10/15/21 2121)  sodium chloride  flush (NS) 0.9 % injection 3 mL (3 mLs Intravenous Given 10/15/21 2048)  acetaminophen (TYLENOL) tablet 650 mg (has no administration in time range)    Or  acetaminophen (TYLENOL) suppository 650 mg (has no administration in time range)  ondansetron (ZOFRAN) tablet 4 mg (has no administration in time range)    Or  ondansetron (ZOFRAN) injection 4 mg (has no administration in time range)  insulin aspart (novoLOG) injection 0-15 Units (3 Units Subcutaneous Given 10/15/21 2118)  vancomycin (VANCOCIN) capsule 125 mg (125 mg Oral Given 10/15/21 2125)    Followed by  vancomycin (VANCOCIN) capsule 125 mg (has no administration in time range)    Followed by  vancomycin (VANCOCIN) capsule 125 mg (has no administration in time range)    Followed by  vancomycin (VANCOCIN) capsule 125 mg (has no administration in time range)    Followed by  vancomycin (VANCOCIN) capsule 125 mg (has no administration in time range)  potassium chloride 10 mEq in 100 mL IVPB (10 mEq Intravenous New Bag/Given 10/15/21 2124)  ondansetron (ZOFRAN-ODT) disintegrating tablet 4 mg (4 mg Oral Given 10/15/21 1713)  sodium chloride 0.9 % bolus 2,000 mL (0 mLs Intravenous Stopped 10/15/21 2132)  ceFEPIme (MAXIPIME) 2 g in sodium chloride 0.9 % 100 mL IVPB (0 g Intravenous Stopped 10/15/21 1745)  iohexol (OMNIPAQUE) 300 MG/ML solution 100 mL (100 mLs Intravenous Contrast Given 10/15/21 1721)    ED Course  I have reviewed the triage vital signs and the nursing notes.  Pertinent labs & imaging results that were available during my care of the patient were reviewed by me and considered in my medical decision making (see chart for details).  Patient is presenting for concern will of severe sepsis, which may be related to C. difficile infection versus intra-abdominal perforation or other injury.  She may also be dehydrated.  Her lactate is elevated.  Creatinine is also somewhat elevated over the baseline.  Her sepsis fluid boluses  have been ordered, broad-spectrum antibiotics empirically, but we will try to send a C. difficile sample.  She will need a CT of the abdomen pelvis to rule out GI perforation or abscess.  She is hypotensive on arrival, will see if this improves of fluids.  She is mentating well however and is not confused.  I personally reviewed interpret her prior medical work-up as well as her labs today.  White blood cell count is elevated 18.6.  Lactate is 4.4.  I reviewed & interpreted chest x-ray shows no focal infiltrates or PNA  Clinical Course as of 10/15/21 2133  Sun Oct 15, 2021  1801 IMPRESSION: 1. Fast transition state of the colon. No associated bowel perforation or findings of inflammation. 2. Nonobstructive punctate bilateral nephrolithiasis. 3.  Aortic Atherosclerosis (ICD10-I70.0).  [MT]  6144 BP improved with fluids, anticipate stable for tele floor - will need repeat lactate after bolus completed.  Awaiting stool cx, if C Diff positive would advise initiation of PO vancomyicn - paged hospitalist for admission. [MT]  3154 Signed out to hospitalist dr patel [MT]    Clinical Course User Index [MT] Langston Masker Carola Rhine, MD    Final Clinical Impression(s) / ED Diagnoses Final diagnoses:  Sepsis, due to unspecified organism, unspecified whether acute organ dysfunction present [facility])  Diarrhea, unspecified type    Rx / DC Orders ED Discharge Orders     None        Wyvonnia Dusky, MD 10/15/21 2133

## 2021-10-15 NOTE — H&P (Signed)
History and Physical    Diana Herman DOB: May 19, 1966 DOA: 10/15/2021  PCP: Wayland Salinas, MD  Patient coming from: Home  I have personally briefly reviewed patient's old medical records in [place]  Chief Complaint: Abdominal pain, nausea, vomiting, diarrhea  HPI: Diana Herman is a 55 y.o. female with medical history significant for polymyalgia rheumatica on chronic prednisone 20 mg daily, T2DM, HTN, HLD, depression/anxiety, iron deficiency anemia, endometrial cancer s/p robotic hysterectomy and b/l SOO, OSA on CPAP, and recent admission for C. difficile gastroenteritis who presents to the ED for evaluation of abdominal pain with nausea, vomiting, diarrhea.  Patient was last admitted 08/13/2021-08/15/2021 for food poisoning versus C. difficile colitis with associated AKI.  She was treated with oral vancomycin and completed a 10-day course.  Patient states that she has been feeling well since then.  She did have UTI symptoms about 2-3 weeks ago and completed a 10-day course of oral Keflex.  She says that she was in her usual state of health until this morning (11/27) when she developed lower abdominal cramping.  This was followed by frequent watery diarrhea.  She has had some nausea and vomiting as well.  She has been feeling dehydrated but has no appetite.  She has had subjective fevers and diaphoresis.  She denies any chest pain, dyspnea, dysuria.  She denies eating anything out of the ordinary recently.  ED Course:  Initial vitals showed BP 87/63, pulse 116, RR 18, temp 98.9 F, SPO2 100% on room air.  Labs show WBC 18.6, hemoglobin 14.7, platelets 407,000, sodium 139, potassium 3.3, bicarb 18, BUN 13, creatinine 1.56, serum glucose 226, LFTs within normal limits, lactic acid 4.4, i-STAT beta-hCG <5.0.  COVID and influenza PCR negative.  Blood cultures collected and in process.  Urinalysis and C. difficile screen ordered and pending collection.  2  view chest x-ray is negative for focal consolidation, edema, effusion.  CT abdomen/pelvis with contrast shows fast transition state of the colon without associated bowel perforations or findings of inflammation.  Nonobstructive punctate bilateral nephrolithiasis and aortic atherosclerosis noted.  Patient was given 2 L NS followed by LR@150 /hour, IV vancomycin and cefepime.  The hospitalist service was consulted to admit for further evaluation and management.  Review of Systems: All systems reviewed and are negative except as documented in history of present illness above.   Past Medical History:  Diagnosis Date   Allergy    Anemia    Anxiety    Cataract    forming    Depression    DM (diabetes mellitus) (HCC)    GERD (gastroesophageal reflux disease)    past    Hx of adenomatous colonic polyps 04/28/2021   Hyperlipidemia    Hypertension    Insomnia    Morbid obesity (HCC)    OA (osteoarthritis)    OSA (obstructive sleep apnea)    on CPAP   Polymyalgia rheumatica ([place])    Sleep apnea    wears cpap     Past Surgical History:  Procedure Laterality Date   ACHILLES TENDON SURGERY Right    COLONOSCOPY  04/14/2021   [facility]   hysteroscopy and polypectomy     TONSILLECTOMY     WISDOM TOOTH EXTRACTION     early 10's     Social History:  reports that she has never smoked. She has never used smokeless tobacco. She reports current alcohol use. She reports that she does not use drugs.  Allergies  Allergen  Reactions   Other Other (See Comments)    Apri causes depression    Family History  Problem Relation Age of Onset   Colon polyps Mother    Depression Mother    Hypertension Mother    Hyperlipidemia Mother    Diabetes Father    Alcoholism Father    Pancreatitis Father    Non-Hodgkin's lymphoma Maternal Grandmother    Heart attack Maternal Grandmother    Depression Maternal Grandmother    Hypertension Maternal Grandmother    Hyperlipidemia Maternal  Grandmother    Diabetes Maternal Grandfather    Heart disease Maternal Grandfather    Hypertension Maternal Grandfather    Alcoholism Paternal Grandmother    Diverticulitis Paternal Grandmother    Heart disease Maternal Uncle    Stroke Maternal Uncle    Colon cancer Neg Hx    Esophageal cancer Neg Hx    Rectal cancer Neg Hx    Stomach cancer Neg Hx      Prior to Admission medications   Medication Sig Start Date End Date Taking? Authorizing Provider  alendronate (FOSAMAX) 35 MG tablet Take 1 tablet by mouth every 7 (seven) days. 05/30/20   [provider]  buPROPion (WELLBUTRIN XL) 150 MG 24 hr tablet Take 150 mg by mouth daily. 06/26/21   [provider]  Calcium Carb-Cholecalciferol (CALCIUM-VITAMIN D) 500-400 MG-UNIT TABS Take 1 tablet by mouth daily.    [provider]  clonazePAM (KLONOPIN) 0.5 MG tablet Take 0.5 mg by mouth 2 (two) times daily as needed for anxiety. 07/19/08   [provider]  DULoxetine (CYMBALTA) 60 MG capsule Take 2 capsules by mouth daily. 05/30/20   [provider]  eszopiclone (LUNESTA) 2 MG TABS tablet Take 2 mg by mouth at bedtime. 01/04/21   [provider]  gabapentin (NEURONTIN) 300 MG capsule Take 300 mg by mouth daily. 01/18/21   [provider]  metFORMIN (GLUCOPHAGE) 500 MG tablet Take 1,000 mg by mouth daily with breakfast. 08/31/20   [provider]  pilocarpine (SALAGEN) 5 MG tablet Take 5 mg by mouth daily. 12/19/20 12/19/21  [provider]  predniSONE (DELTASONE) 10 MG tablet Take 20 mg by mouth daily.  08/08/20   [provider]  rosuvastatin (CRESTOR) 10 MG tablet Take 10 mg by mouth at bedtime. 07/10/21   [provider]  sertraline (ZOLOFT) 100 MG tablet Take 1 tablet by mouth daily. 07/11/20   [provider]  traZODone (DESYREL) 50 MG tablet Take 50-100 mg by mouth at bedtime as needed for sleep. 05/30/20   [provider]    Physical  Exam: Vitals:   10/15/21 1600 10/15/21 1705 10/15/21 1751 10/15/21 1830  BP:  136/85 (!) 131/94 (!) 144/78  Pulse:  (!) 121 (!) 120 (!) 117  Resp:  (!) 24 20 (!) 24  Temp:      SpO2:  100% 100% 100%  Weight: 111.5 kg     Height: 5\' 5"  (1.651 m)      Constitutional: Resting in bed, NAD, calm, comfortable Eyes: PERRL, lids and conjunctivae normal ENMT: Mucous membranes are dry. Posterior pharynx clear of any exudate or lesions.Normal dentition.  Neck: normal, supple, no masses. Respiratory: clear to auscultation bilaterally, no wheezing, no crackles. Normal respiratory effort. No accessory muscle use.  Cardiovascular: Regular rate and rhythm, no murmurs / rubs / gallops. No extremity edema. 2+ pedal pulses. Abdomen: no tenderness, no masses palpated. No hepatosplenomegaly. Bowel sounds positive.  Musculoskeletal: no clubbing / cyanosis.  No joint deformity upper and lower extremities. Good ROM, no contractures. Normal muscle tone.  Skin: no rashes, lesions, ulcers. No induration Neurologic: CN 2-12 grossly intact. Sensation intact. Strength 5/5 in all 4.  Psychiatric: Normal judgment and insight. Alert and oriented x 3. Normal mood.   Labs on Admission: I have personally reviewed following labs and imaging studies  CBC: Recent Labs  Lab 10/15/21 1502  WBC 18.6*  NEUTROABS 15.1*  HGB 14.7  HCT 48.6*  MCV 91.9  PLT 841*   Basic Metabolic Panel: Recent Labs  Lab 10/15/21 1502  NA 139  K 3.3*  CL 104  CO2 18*  GLUCOSE 226*  BUN 13  CREATININE 1.56*  CALCIUM 10.5*   GFR: Estimated Creatinine Clearance: 51.9 mL/min (A) (by C-G formula based on SCr of 1.56 mg/dL (H)). Liver Function Tests: Recent Labs  Lab 10/15/21 1502  AST 22  ALT 29  ALKPHOS 101  BILITOT 0.4  PROT 8.3*  ALBUMIN 4.5   No results for input(s): LIPASE, AMYLASE in the last 168 hours. No results for input(s): AMMONIA in the last 168 hours. Coagulation Profile: No results for input(s): INR, PROTIME  in the last 168 hours. Cardiac Enzymes: No results for input(s): CKTOTAL, CKMB, CKMBINDEX, TROPONINI in the last 168 hours. BNP (last 3 results) No results for input(s): PROBNP in the last 8760 hours. HbA1C: No results for input(s): HGBA1C in the last 72 hours. CBG: No results for input(s): GLUCAP in the last 168 hours. Lipid Profile: No results for input(s): CHOL, HDL, LDLCALC, TRIG, CHOLHDL, LDLDIRECT in the last 72 hours. Thyroid Function Tests: No results for input(s): TSH, T4TOTAL, FREET4, T3FREE, THYROIDAB in the last 72 hours. Anemia Panel: No results for input(s): VITAMINB12, FOLATE, FERRITIN, TIBC, IRON, RETICCTPCT in the last 72 hours. Urine analysis:    Component Value Date/Time   COLORURINE YELLOW 08/13/2021 1955   APPEARANCEUR CLOUDY (A) 08/13/2021 1955   LABSPEC <1.005 (L) 08/13/2021 1955   PHURINE 6.0 08/13/2021 1955   GLUCOSEU NEGATIVE 08/13/2021 1955   HGBUR LARGE (A) 08/13/2021 1955   BILIRUBINUR NEGATIVE 08/13/2021 1955   KETONESUR NEGATIVE 08/13/2021 1955   PROTEINUR NEGATIVE 08/13/2021 1955   NITRITE NEGATIVE 08/13/2021 1955   LEUKOCYTESUR LARGE (A) 08/13/2021 1955    Radiological Exams on Admission: DG Chest 2 View  Result Date: 10/15/2021 CLINICAL DATA:  Nausea and vomiting EXAM: CHEST - 2 VIEW COMPARISON:  Chest x-ray 08/13/2021 FINDINGS: Heart size and mediastinal contours are within normal limits. No suspicious pulmonary opacities identified. No pleural effusion or pneumothorax visualized. No acute osseous abnormality appreciated. IMPRESSION: No acute intrathoracic process identified. Electronically Signed   By: Ofilia Neas M.D.   On: 10/15/2021 15:29   CT ABDOMEN PELVIS W CONTRAST  Result Date: 10/15/2021 CLINICAL DATA:  Abdominal pain, fever suspected C diff colitis evaluate for perforation EXAM: CT ABDOMEN AND PELVIS WITH CONTRAST TECHNIQUE: Multidetector CT imaging of the abdomen and pelvis was performed using the standard protocol following  bolus administration of intravenous contrast. CONTRAST:  125mL OMNIPAQUE IOHEXOL 300 MG/ML  SOLN COMPARISON:  None. FINDINGS: Lower chest: No acute abnormality. Hepatobiliary: No focal liver abnormality. No gallstones, gallbladder wall thickening, or pericholecystic fluid. No biliary dilatation. Pancreas: No focal lesion. Normal pancreatic contour. No surrounding inflammatory changes. No main pancreatic ductal dilatation. Spleen: Normal in size without focal abnormality. Adrenals/Urinary Tract: No adrenal nodule bilaterally. Bilateral kidneys enhance symmetrically. Punctate calcifications within bilateral kidneys. No hydronephrosis. No hydroureter. The urinary bladder is decompressed. On delayed imaging, there  is no urothelial wall thickening and there are no filling defects in the opacified portions of the bilateral collecting systems or ureters. Stomach/Bowel: Stomach is within normal limits. No evidence of bowel wall thickening or dilatation. Fluid throughout the lumen of the colon. No associated bowel wall thickening or pericolonic fat stranding. No pneumatosis. Appendix appears in caliber with no inflammatory changing changes. Appendicoliths is noted within the appendiceal lumen (3:73). Vascular/Lymphatic: No abdominal aorta or iliac aneurysm. Mild atherosclerotic plaque of the aorta and its branches. No abdominal, pelvic, or inguinal lymphadenopathy. Reproductive: Status post hysterectomy. No adnexal masses. Other: No intraperitoneal free fluid. No intraperitoneal free gas. No organized fluid collection. Musculoskeletal: Tiny fat containing umbilical hernia. No suspicious lytic or blastic osseous lesions. No acute displaced fracture. Multilevel degenerative changes of the spine. IMPRESSION: 1. Fast transition state of the colon. No associated bowel perforation or findings of inflammation. 2. Nonobstructive punctate bilateral nephrolithiasis. 3.  Aortic Atherosclerosis (ICD10-I70.0). Electronically Signed   By:  Iven Finn M.D.   On: 10/15/2021 17:48    EKG: Not performed.  Assessment/Plan Principal Problem:   Severe sepsis with acute organ dysfunction (HCC) Active Problems:   Mixed hyperlipidemia   OSA on CPAP   Polymyalgia rheumatica (HCC)   AKI (acute kidney injury) ([place])   Type 2 diabetes mellitus without complication (HCC)   Hypotension   Hypercalcemia   Hypokalemia   Depression with anxiety   Diana Herman is a 55 y.o. female with medical history significant for polymyalgia rheumatica on chronic prednisone 20 mg daily, T2DM, HTN, HLD, depression/anxiety, iron deficiency anemia, endometrial cancer s/p robotic hysterectomy and b/l SOO, OSA on CPAP, and recent admission for C. difficile gastroenteritis who is admitted with severe sepsis presumed due to recurrent C. difficile infection.  Severe sepsis presumed due to recurrent C. difficile gastroenteritis: Patient presenting with tachycardia, tachypnea, hypotension, leukocytosis, lactic acidosis, and AKI in setting of likely recurrent C. difficile infection. Chest x-ray negative for evidence of pneumonia.  COVID/influenza negative.  Patient denies any dysuria.  CT A/P shows a fast transition state of the colon without bowel perforation or inflammation. -C. difficile test pending: -Discontinue IV antibiotics -Restarted on oral vancomycin, prolonged taper -Can consider switch to fidaxomicin pending clinical progress as she is on chronic prednisone -Keep n.p.o. except sips/meds/ice chips -Continue IV fluid resuscitation overnight -Follow blood cultures  Acute kidney injury: Likely related to hypovolemia from GI losses.  Continue IV fluid hydration and repeat labs in AM.  Hold metformin.  Hypotension: Secondary to hypovolemia from GI losses.  Improving with IV fluid hydration.  Hold antihypertensives.  Hypercalcemia: Likely secondary dehydration.  Continue IV fluid hydration and monitor.  Hypokalemia: Replete with IV potassium,  check magnesium and replete if needed.  Polymyalgia rheumatica: On chronic Plaquenil and prednisone 20 mg daily, continue.  Type 2 diabetes: Holding metformin, placed on SSI q4h while NPO.  Hyperlipidemia: Continue rosuvastatin.  Depression/anxiety: Continue Cymbalta and Klonopin as needed.  OSA: Continue CPAP nightly.  DVT prophylaxis: Lovenox Code Status: Full code, confirmed with patient on admission Family Communication: Discussed with patient, she has discussed with family Disposition Plan: From home and likely discharge to home pending clinical progress Consults called: None Level of care: Progressive Admission status:  Status is: Inpatient  Remains inpatient appropriate because: Presenting with severe sepsis in setting of likely C. difficile gastroenteritis with associated AKI, lactic acidosis, leukocytosis, tachycardia, tachypnea, and hypotension.  Currently requiring continued IV fluid resuscitation and close monitoring for improvement in symptoms and ability  to maintain adequate oral intake.   Zada Finders MD [facility]  If 7PM-7AM, please contact night-coverage [email or web address]  10/15/2021, 8:05 PM

## 2021-10-15 NOTE — Progress Notes (Signed)
[facility] following for sepsis protocol. 

## 2021-10-15 NOTE — ED Provider Notes (Signed)
Emergency Medicine Provider Triage Evaluation Note  Diana Herman , a 55 y.o. female  was evaluated in triage.  Pt complains of abd pain.  Review of Systems  Positive: Abd pain, n/v/d, dizzy, weak Negative: Fever, chills, runny nose, sneeze, cough, dysuria  Physical Exam  BP (!) 92/55   Pulse (!) 117   Temp 98.9 F (37.2 C)   Resp 18   LMP 03/21/2012   SpO2 100%  Gen:   Awake, ill appearing Resp:  Normal effort  MSK:   Moves extremities without difficulty  Other:    Medical Decision Making  Medically screening exam initiated at 3:02 PM.  Appropriate orders placed.  Diana Herman was informed that the remainder of the evaluation will be completed by another provider, this initial triage assessment does not replace that evaluation, and the importance of remaining in the ED until their evaluation is complete.  Diffused abd pain with associated n/v/d throughout the day today.  Denies runny nose, sneeze, cough.  Pt is hypotensive, and tachycardic.    Domenic Moras, PA-C 10/15/21 1503    Wyvonnia Dusky, MD 10/15/21 [phone]

## 2021-10-15 NOTE — Sepsis Progress Note (Signed)
Communication took place with bedside ED RN who confirmed 2nd lactic acid will be drawn now

## 2021-10-15 NOTE — ED Triage Notes (Addendum)
Pt to triage via [facility] from home.  C/o generalized abd pain, nausea, vomiting, and diarrhea since this morning.   CBG 262

## 2021-10-16 DIAGNOSIS — N179 Acute kidney failure, unspecified: Secondary | ICD-10-CM

## 2021-10-16 DIAGNOSIS — E876 Hypokalemia: Secondary | ICD-10-CM | POA: Diagnosis not present

## 2021-10-16 DIAGNOSIS — A419 Sepsis, unspecified organism: Secondary | ICD-10-CM | POA: Diagnosis not present

## 2021-10-16 LAB — CBC
HCT: 32.2 % — ABNORMAL LOW (ref 36.0–46.0)
Hemoglobin: 10.3 g/dL — ABNORMAL LOW (ref 12.0–15.0)
MCH: 28.6 pg (ref 26.0–34.0)
MCHC: 32 g/dL (ref 30.0–36.0)
MCV: 89.4 fL (ref 80.0–100.0)
Platelets: 290 10*3/uL (ref 150–400)
RBC: 3.6 MIL/uL — ABNORMAL LOW (ref 3.87–5.11)
RDW: 17.3 % — ABNORMAL HIGH (ref 11.5–15.5)
WBC: 12.2 10*3/uL — ABNORMAL HIGH (ref 4.0–10.5)
nRBC: 0.2 % (ref 0.0–0.2)

## 2021-10-16 LAB — BASIC METABOLIC PANEL
Anion gap: 7 (ref 5–15)
BUN: 15 mg/dL (ref 6–20)
CO2: 19 mmol/L — ABNORMAL LOW (ref 22–32)
Calcium: 8.2 mg/dL — ABNORMAL LOW (ref 8.9–10.3)
Chloride: 112 mmol/L — ABNORMAL HIGH (ref 98–111)
Creatinine, Ser: 1.18 mg/dL — ABNORMAL HIGH (ref 0.44–1.00)
GFR, Estimated: 55 mL/min — ABNORMAL LOW (ref >60)
Glucose, Bld: 108 mg/dL — ABNORMAL HIGH (ref 70–99)
Potassium: 3.7 mmol/L (ref 3.5–5.1)
Sodium: 138 mmol/L (ref 135–145)

## 2021-10-16 LAB — C DIFFICILE QUICK SCREEN W PCR REFLEX
C Diff antigen: POSITIVE — AB
C Diff toxin: NEGATIVE

## 2021-10-16 LAB — GLUCOSE, CAPILLARY
Glucose-Capillary: 179 mg/dL — ABNORMAL HIGH (ref 70–99)
Glucose-Capillary: 103 mg/dL — ABNORMAL HIGH (ref 70–99)

## 2021-10-16 LAB — MAGNESIUM: Magnesium: 1.3 mg/dL — ABNORMAL LOW (ref 1.7–2.4)

## 2021-10-16 LAB — LACTIC ACID, PLASMA
Lactic Acid, Venous: 4.4 mmol/L (ref 0.5–1.9)
Lactic Acid, Venous: 1.7 mmol/L (ref 0.5–1.9)

## 2021-10-16 LAB — CBG MONITORING, ED
Glucose-Capillary: 107 mg/dL — ABNORMAL HIGH (ref 70–99)
Glucose-Capillary: 108 mg/dL — ABNORMAL HIGH (ref 70–99)
Glucose-Capillary: 109 mg/dL — ABNORMAL HIGH (ref 70–99)
Glucose-Capillary: 163 mg/dL — ABNORMAL HIGH (ref 70–99)

## 2021-10-16 LAB — PROCALCITONIN: Procalcitonin: 13.07 ng/mL

## 2021-10-16 LAB — CLOSTRIDIUM DIFFICILE BY PCR, REFLEXED: Toxigenic C. Difficile by PCR: POSITIVE — AB

## 2021-10-16 MED ORDER — LACTATED RINGERS IV BOLUS
1000.0000 mL | Freq: Once | INTRAVENOUS | Status: AC
Start: 2021-10-16 — End: 2021-10-16
  Administered 2021-10-16: 06:00:00 1000 mL via INTRAVENOUS

## 2021-10-16 MED ORDER — INSULIN ASPART 100 UNIT/ML IJ SOLN
0.0000 [IU] | Freq: Three times a day (TID) | INTRAMUSCULAR | Status: DC
  Administered 2021-10-16 – 2021-10-17 (×3): 2 [IU] via SUBCUTANEOUS
  Administered 2021-10-17: 1 [IU] via SUBCUTANEOUS

## 2021-10-16 MED ORDER — MAGNESIUM SULFATE 4 GM/100ML IV SOLN
4.0000 g | Freq: Once | INTRAVENOUS | Status: AC
  Administered 2021-10-16: 08:00:00 4 g via INTRAVENOUS
  Filled 2021-10-16: qty 100

## 2021-10-16 NOTE — Progress Notes (Signed)
PROGRESS NOTE        PATIENT DETAILS Name: Diana Herman Age: 55 y.o. Sex: female Date of Birth: 1966-09-15 Admit Date: 10/15/2021 Admitting Physician Lenore Cordia, MD QJJ:[id number], Shyrl Numbers, MD  Brief Narrative: Patient is a 55 y.o. female with history of PMR on chronic steroids, DM-2, HTN, HLD, OSA on CPAP, endometrial cancer-s/p robotic hysterectomy and bilateral salpingo-oophorectomy-recent hospitalization in September for presumed C. difficile colitis-presented with diarrhea-thought to have recurrence of C. difficile colitis.  Subjective: Had significant amount of loose watery stools yesterday-she continues to have diarrhea but thinks that this episodes of stooling has decreased considerably.  Stool still watery.  Objective: Vitals: Blood pressure (!) 104/53, pulse 99, temperature 98.8 F (37.1 C), temperature source Oral, resp. rate 17, height 5\' 5"  (1.651 m), weight 111.5 kg, last menstrual period 03/21/2012, SpO2 96 %.   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  Start full liquids and advance as tolerated.  Approximately 3 weeks ago-patient apparently had pyelonephritis and required 10 days of oral Keflex.  She has been counseled extensively regarding minimizing  antibiotic use is much as possible.  AKI: Hemodynamically mediated due to diarrhea-improving with IV fluids.  Hypotension: Due to volume loss and sepsis-improving with supportive care.  Hypokalemia: Repleted-monitor periodically.  Hypercalcemia: Due to dehydration-improved with IVF.  HLD: Continue statin  DM-2 (A1c 6.7 on 9/25): CBG stable-continue SSI-resume metformin on discharge.  Recent Labs    10/16/21 0030 10/16/21 0600 10/16/21 0736  GLUCAP 108* 109* 107*     Depression/anxiety: Mood appears to be stable-continue Cymbalta/as needed Klonopin.  OSA: CPAP nightly  PMR: Continue steroids and Plaquenil.  If hypotension recurs-May need to be switched to stress dosing of steroids.  Morbid Obesity Estimated body mass index is 40.91 kg/m as calculated from the following:   Height as of this encounter: 5\' 5"  (1.651 m).   Weight as of this encounter: 111.5 kg.    Procedures: None Consults: None DVT Prophylaxis: Lovenox Code Status:Full code  Family Communication: None at bedside  Time spent: 35 minutes-Greater than 50% of this time was spent in counseling, explanation of diagnosis, planning of further management, and coordination of care.   Disposition Plan: Status is: Inpatient  Remains inpatient appropriate because: Resolving sepsis due to C. difficile colitis-needs continued inpatient monitoring-further improvement of diarrhea before consideration of discharge.    Diet: Diet Order             Diet full liquid Room service appropriate? Yes; Fluid consistency: Thin  Diet effective now                     Antimicrobial agents: Anti-infectives (From admission, onward)    Start  Dose/Rate Route Frequency Ordered Stop   11/21/21 1000  vancomycin (VANCOCIN) capsule 125 mg       See Hyperspace for full Linked Orders Report.   125 mg Oral Every 3 DAYS 10/15/21 2047 11/30/21 0959   11/13/21 1000  vancomycin (VANCOCIN) capsule 125 mg       See  Hyperspace for full Linked Orders Report.   125 mg Oral Every other day 10/15/21 2047 11/21/21 0959   11/06/21 1000  vancomycin (VANCOCIN) capsule 125 mg       See Hyperspace for full Linked Orders Report.   125 mg Oral Daily 10/15/21 2047 11/13/21 0959   10/29/21 2200  vancomycin (VANCOCIN) capsule 125 mg       See Hyperspace for full Linked Orders Report.   125 mg Oral 2 times daily 10/15/21 2047 11/05/21 2159   10/16/21 0000  hydroxychloroquine (PLAQUENIL) tablet 200 mg        200 mg Oral 2 times daily 10/15/21 2344     10/15/21 2200  vancomycin (VANCOCIN) capsule 125 mg       See Hyperspace for full Linked Orders Report.   125 mg Oral 4 times daily 10/15/21 2047 10/29/21 2159   10/15/21 1615  vancomycin (VANCOREADY) IVPB 2000 mg/400 mL  Status:  Discontinued        2,000 mg 200 mL/hr over 120 Minutes Intravenous  Once 10/15/21 1614 10/15/21 2038   10/15/21 1615  ceFEPIme (MAXIPIME) 2 g in sodium chloride 0.9 % 100 mL IVPB        2 g 200 mL/hr over 30 Minutes Intravenous  Once 10/15/21 1614 10/15/21 1745        MEDICATIONS: Scheduled Meds:  DULoxetine  120 mg Oral Daily   enoxaparin (LOVENOX) injection  40 mg Subcutaneous Q24H   gabapentin  300 mg Oral QHS   hydroxychloroquine  200 mg Oral BID   insulin aspart  0-15 Units Subcutaneous Q4H   predniSONE  20 mg Oral Daily   rosuvastatin  10 mg Oral QHS   sodium chloride flush  3 mL Intravenous Q12H   traZODone  75 mg Oral QHS   vancomycin  125 mg Oral QID   Followed by   Derrill Memo ON 10/29/2021] vancomycin  125 mg Oral BID   Followed by   Derrill Memo ON 11/06/2021] vancomycin  125 mg Oral Daily   Followed by   Derrill Memo ON 11/13/2021] vancomycin  125 mg Oral QODAY   Followed by   Derrill Memo ON 11/21/2021] vancomycin  125 mg Oral Q3 days   Continuous Infusions:  sodium chloride Stopped (10/16/21 0634)   PRN Meds:.acetaminophen **OR** acetaminophen, clonazePAM, ondansetron **OR** ondansetron (ZOFRAN) IV   I have personally reviewed  following labs and imaging studies  LABORATORY DATA: CBC: Recent Labs  Lab 10/15/21 1502 10/16/21 0453  WBC 18.6* 12.2*  NEUTROABS 15.1*  --   HGB 14.7 10.3*  HCT 48.6* 32.2*  MCV 91.9 89.4  PLT 487* 211    Basic Metabolic Panel: Recent Labs  Lab 10/15/21 1502 10/16/21 0453  NA 139 138  K 3.3* 3.7  CL 104 112*  CO2 18* 19*  GLUCOSE 226* 108*  BUN 13 15  CREATININE 1.56* 1.18*  CALCIUM 10.5* 8.2*  MG  --  1.3*    GFR: Estimated Creatinine Clearance: 68.6 mL/min (A) (by C-G formula based on SCr of 1.18 mg/dL (H)).  Liver Function Tests: Recent Labs  Lab 10/15/21 1502  AST 22  ALT 29  ALKPHOS 101  BILITOT  0.4  PROT 8.3*  ALBUMIN 4.5   No results for input(s): LIPASE, AMYLASE in the last 168 hours. No results for input(s): AMMONIA in the last 168 hours.  Coagulation Profile: No results for input(s): INR, PROTIME in the last 168 hours.  Cardiac Enzymes: No results for input(s): CKTOTAL, CKMB, CKMBINDEX, TROPONINI in the last 168 hours.  BNP (last 3 results) No results for input(s): PROBNP in the last 8760 hours.  Lipid Profile: No results for input(s): CHOL, HDL, LDLCALC, TRIG, CHOLHDL, LDLDIRECT in the last 72 hours.  Thyroid Function Tests: No results for input(s): TSH, T4TOTAL, FREET4, T3FREE, THYROIDAB in the last 72 hours.  Anemia Panel: No results for input(s): VITAMINB12, FOLATE, FERRITIN, TIBC, IRON, RETICCTPCT in the last 72 hours.  Urine analysis:    Component Value Date/Time   COLORURINE YELLOW 10/15/2021 2230   APPEARANCEUR CLOUDY (A) 10/15/2021 2230   LABSPEC 1.033 (H) 10/15/2021 2230   PHURINE 5.0 10/15/2021 2230   GLUCOSEU NEGATIVE 10/15/2021 2230   HGBUR SMALL (A) 10/15/2021 2230   BILIRUBINUR NEGATIVE 10/15/2021 2230   KETONESUR NEGATIVE 10/15/2021 2230   PROTEINUR 30 (A) 10/15/2021 2230   NITRITE NEGATIVE 10/15/2021 2230   LEUKOCYTESUR LARGE (A) 10/15/2021 2230    Sepsis Labs: Lactic Acid, Venous    Component Value  Date/Time   LATICACIDVEN 1.7 10/16/2021 0239    MICROBIOLOGY: Recent Results (from the past 240 hour(s))  Resp Panel by RT-PCR (Flu A&B, Covid) Nasopharyngeal Swab     Status: None   Collection Time: 10/15/21  3:03 PM   Specimen: Nasopharyngeal Swab; Nasopharyngeal(NP) swabs in vial transport medium  Result Value Ref Range Status   SARS Coronavirus 2 by RT PCR NEGATIVE NEGATIVE Final    Comment: (NOTE) SARS-CoV-2 target nucleic acids are NOT DETECTED.  The SARS-CoV-2 RNA is generally detectable in upper respiratory specimens during the acute phase of infection. The lowest concentration of SARS-CoV-2 viral copies this assay can detect is 138 copies/mL. A negative result does not preclude SARS-Cov-2 infection and should not be used as the sole basis for treatment or other patient management decisions. A negative result may occur with  improper specimen collection/handling, submission of specimen other than nasopharyngeal swab, presence of viral mutation(s) within the areas targeted by this assay, and inadequate number of viral copies(<138 copies/mL). A negative result must be combined with clinical observations, patient history, and epidemiological information. The expected result is Negative.  Fact Sheet for Patients:  EntrepreneurPulse.com.au  Fact Sheet for Healthcare Providers:  IncredibleEmployment.be  This test is no t yet approved or cleared by the [place] FDA and  has been authorized for detection and/or diagnosis of SARS-CoV-2 by FDA under an Emergency Use Authorization (EUA). This EUA will remain  in effect (meaning this test can be used) for the duration of the COVID-19 declaration under Section 564(b)(1) of the Act, 21 U.S.C.section 360bbb-3(b)(1), unless the authorization is terminated  or revoked sooner.       Influenza A by PCR NEGATIVE NEGATIVE Final   Influenza B by PCR NEGATIVE NEGATIVE Final    Comment: (NOTE) The  Xpert Xpress SARS-CoV-2/FLU/RSV plus assay is intended as an aid in the diagnosis of influenza from Nasopharyngeal swab specimens and should not be used as a sole basis for treatment. Nasal washings and aspirates are unacceptable for Xpert Xpress SARS-CoV-2/FLU/RSV testing.  Fact Sheet for Patients: EntrepreneurPulse.com.au  Fact Sheet for Healthcare Providers: IncredibleEmployment.be  This test is not yet approved or cleared by the [place] FDA and has  been authorized for detection and/or diagnosis of SARS-CoV-2 by FDA under an Emergency Use Authorization (EUA). This EUA will remain in effect (meaning this test can be used) for the duration of the COVID-19 declaration under Section 564(b)(1) of the Act, 21 U.S.C. section 360bbb-3(b)(1), unless the authorization is terminated or revoked.  Performed at [facility], [place] [address] [place], [place] [address]   Culture, blood (Routine x 2)     Status: None (Preliminary result)   Collection Time: 10/15/21  5:04 PM   Specimen: BLOOD LEFT WRIST  Result Value Ref Range Status   Specimen Description BLOOD LEFT WRIST  Final   Special Requests   Final    BOTTLES DRAWN AEROBIC AND ANAEROBIC Blood Culture results may not be optimal due to an inadequate volume of blood received in culture bottles   Culture   Final    NO GROWTH < 12 HOURS Performed at [facility], [place] [address], [place], [place] [address]    Report Status PENDING  Incomplete  Culture, blood (Routine x 2)     Status: None (Preliminary result)   Collection Time: 10/15/21  5:10 PM   Specimen: BLOOD  Result Value Ref Range Status   Specimen Description BLOOD RIGHT ANTECUBITAL  Final   Special Requests   Final    BOTTLES DRAWN AEROBIC AND ANAEROBIC Blood Culture adequate volume   Culture   Final    NO GROWTH < 12 HOURS Performed at [facility], [place] [address] [place], [place] [address]    Report Status PENDING   Incomplete  C Difficile Quick Screen w PCR reflex     Status: Abnormal   Collection Time: 10/15/21 11:29 PM   Specimen: STOOL  Result Value Ref Range Status   C Diff antigen POSITIVE (A) NEGATIVE Final   C Diff toxin NEGATIVE NEGATIVE Final   C Diff interpretation Results are indeterminate. See PCR results.  Final    Comment: Performed at [facility], [place] [address] [place], [place] [address]  C. Diff by PCR, Reflexed     Status: Abnormal   Collection Time: 10/15/21 11:29 PM  Result Value Ref Range Status   Toxigenic C. Difficile by PCR POSITIVE (A) NEGATIVE Final    Comment: Positive for toxigenic C. difficile with little to no toxin production. Only treat if clinical presentation suggests symptomatic illness. Performed at [facility], [place] [address] [place], [place] [address]     RADIOLOGY STUDIES/RESULTS: DG Chest 2 View  Result Date: 10/15/2021 CLINICAL DATA:  Nausea and vomiting EXAM: CHEST - 2 VIEW COMPARISON:  Chest x-ray 08/13/2021 FINDINGS: Heart size and mediastinal contours are within normal limits. No suspicious pulmonary opacities identified. No pleural effusion or pneumothorax visualized. No acute osseous abnormality appreciated. IMPRESSION: No acute intrathoracic process identified. Electronically Signed   By: Ofilia Neas M.D.   On: 10/15/2021 15:29   CT ABDOMEN PELVIS W CONTRAST  Result Date: 10/15/2021 CLINICAL DATA:  Abdominal pain, fever suspected C diff colitis evaluate for perforation EXAM: CT ABDOMEN AND PELVIS WITH CONTRAST TECHNIQUE: Multidetector CT imaging of the abdomen and pelvis was performed using the standard protocol following bolus administration of intravenous contrast. CONTRAST:  16mL OMNIPAQUE IOHEXOL 300 MG/ML  SOLN COMPARISON:  None. FINDINGS: Lower chest: No acute abnormality. Hepatobiliary: No focal liver abnormality. No gallstones, gallbladder wall thickening, or pericholecystic fluid. No biliary dilatation. Pancreas: No focal  lesion. Normal pancreatic contour. No surrounding inflammatory changes. No main  pancreatic ductal dilatation. Spleen: Normal in size without focal abnormality. Adrenals/Urinary Tract: No adrenal nodule bilaterally. Bilateral kidneys enhance symmetrically. Punctate calcifications within bilateral kidneys. No hydronephrosis. No hydroureter. The urinary bladder is decompressed. On delayed imaging, there is no urothelial wall thickening and there are no filling defects in the opacified portions of the bilateral collecting systems or ureters. Stomach/Bowel: Stomach is within normal limits. No evidence of bowel wall thickening or dilatation. Fluid throughout the lumen of the colon. No associated bowel wall thickening or pericolonic fat stranding. No pneumatosis. Appendix appears in caliber with no inflammatory changing changes. Appendicoliths is noted within the appendiceal lumen (3:73). Vascular/Lymphatic: No abdominal aorta or iliac aneurysm. Mild atherosclerotic plaque of the aorta and its branches. No abdominal, pelvic, or inguinal lymphadenopathy. Reproductive: Status post hysterectomy. No adnexal masses. Other: No intraperitoneal free fluid. No intraperitoneal free gas. No organized fluid collection. Musculoskeletal: Tiny fat containing umbilical hernia. No suspicious lytic or blastic osseous lesions. No acute displaced fracture. Multilevel degenerative changes of the spine. IMPRESSION: 1. Fast transition state of the colon. No associated bowel perforation or findings of inflammation. 2. Nonobstructive punctate bilateral nephrolithiasis. 3.  Aortic Atherosclerosis (ICD10-I70.0). Electronically Signed   By: Iven Finn M.D.   On: 10/15/2021 17:48     LOS: 1 day   Oren Binet, MD  [facility]    To contact the attending provider between 7A-7P or the covering provider during after hours 7P-7A, please log into the web site [email or web address] and access using universal [place] password for that web  site. If you do not have the password, please call the hospital operator.  10/16/2021, 11:00 AM

## 2021-10-16 NOTE — Plan of Care (Signed)
  Problem: Education: Goal: Knowledge of General Education information will improve Description: Including pain rating scale, medication(s)/side effects and non-pharmacologic comfort measures Outcome: Progressing   Problem: Health Behavior/Discharge Planning: Goal: Ability to manage health-related needs will improve Outcome: Progressing   Problem: Clinical Measurements: Goal: Ability to maintain clinical measurements within normal limits will improve Outcome: Progressing Goal: Will remain free from infection Outcome: Progressing Goal: Diagnostic test results will improve Outcome: Progressing Goal: Respiratory complications will improve Outcome: Progressing Goal: Cardiovascular complication will be avoided Outcome: Progressing   Problem: Activity: Goal: Risk for activity intolerance will decrease Outcome: Progressing   Problem: Nutrition: Goal: Adequate nutrition will be maintained Outcome: Progressing   Problem: Coping: Goal: Level of anxiety will decrease Outcome: Progressing   Problem: Pain Managment: Goal: General experience of comfort will improve Outcome: Progressing   

## 2021-10-16 NOTE — ED Notes (Deleted)
Breakfast orders placed 

## 2021-10-16 NOTE — ED Notes (Signed)
Notified MD via amion chat about pt BP trending low and tachycardia

## 2021-10-16 NOTE — ED Notes (Signed)
ED TO INPATIENT HANDOFF REPORT  ED Nurse Name and Phone #: Cindie Laroche [phone]  S Name/Age/Gender Diana Herman 55 y.o. female Room/Bed: 026C/026C  Code Status   Code Status: Full Code  Home/SNF/Other Home Patient oriented to: self, place, time, and situation Is this baseline? Yes   Triage Complete: Triage complete  Chief Complaint Severe sepsis with acute organ dysfunction ([place]) [A41.9, R65.20]  Triage Note Pt to triage via PTAR from home.  C/o generalized abd pain, nausea, vomiting, and diarrhea since this morning.   CBG 262   Allergies Allergies  Allergen Reactions   Other Other (See Comments)    Apri causes depression    Level of Care/Admitting Diagnosis ED Disposition     ED Disposition  Admit   Condition  --   [place]: [place] [[id number]]  Level of Care: Progressive [102]  Admit to Progressive based on following criteria: MULTISYSTEM THREATS such as stable sepsis, metabolic/electrolyte imbalance with or without encephalopathy that is responding to early treatment.  May admit patient to [facility] or [facility] if equivalent level of care is available:: No  Covid Evaluation: Confirmed COVID Negative  Diagnosis: Severe sepsis with acute organ dysfunction [facility]) [[id number]]  Admitting Physician: Lenore Cordia [[id number]]  Attending Physician: Lenore Cordia [[id number]]  Estimated length of stay: past midnight tomorrow  Certification:: I certify this patient will need inpatient services for at least 2 midnights          B Medical/Surgery History Past Medical History:  Diagnosis Date   Allergy    Anemia    Anxiety    Cataract    forming    Depression    DM (diabetes mellitus) ([place])    GERD (gastroesophageal reflux disease)    past    Hx of adenomatous colonic polyps 04/28/2021   Hyperlipidemia    Hypertension    Insomnia    Morbid obesity ([place])    OA (osteoarthritis)    OSA (obstructive sleep apnea)    on  CPAP   Polymyalgia rheumatica ([place])    Sleep apnea    wears cpap    Past Surgical History:  Procedure Laterality Date   ACHILLES TENDON SURGERY Right    COLONOSCOPY  04/14/2021   [facility]   hysteroscopy and polypectomy     TONSILLECTOMY     WISDOM TOOTH EXTRACTION     early 90's      A IV Location/Drains/Wounds Patient Lines/Drains/Airways Status     Active Line/Drains/Airways     Name Placement date Placement time Site Days   Peripheral IV 10/15/21 20 G Left;Posterior Wrist 10/15/21  1704  Wrist  1            Intake/Output Last 24 hours  Intake/Output Summary (Last 24 hours) at 10/16/2021 1338 Last data filed at 10/15/2021 1745 Gross per 24 hour  Intake 100 ml  Output --  Net 100 ml    Labs/Imaging Results for orders placed or performed during the hospital encounter of 10/15/21 (from the past 48 hour(s))  Comprehensive metabolic panel     Status: Abnormal   Collection Time: 10/15/21  3:02 PM  Result Value Ref Range   Sodium 139 135 - 145 mmol/L   Potassium 3.3 (L) 3.5 - 5.1 mmol/L   Chloride 104 98 - 111 mmol/L   CO2 18 (L) 22 - 32 mmol/L   Glucose, Bld 226 (H) 70 - 99 mg/dL    Comment: Glucose reference range  applies only to samples taken after fasting for at least 8 hours.   BUN 13 6 - 20 mg/dL   Creatinine, Ser 1.56 (H) 0.44 - 1.00 mg/dL   Calcium 10.5 (H) 8.9 - 10.3 mg/dL   Total Protein 8.3 (H) 6.5 - 8.1 g/dL   Albumin 4.5 3.5 - 5.0 g/dL   AST 22 15 - 41 U/L   ALT 29 0 - 44 U/L   Alkaline Phosphatase 101 38 - 126 U/L   Total Bilirubin 0.4 0.3 - 1.2 mg/dL   GFR, Estimated 40 (L) >60 mL/min    Comment: (NOTE) Calculated using the CKD-EPI Creatinine Equation (2021)    Anion gap 17 (H) 5 - 15    Comment: Performed at [facility], [place] [address] [place], [place] [address]  Lactic acid, plasma     Status: Abnormal   Collection Time: 10/15/21  3:02 PM  Result Value Ref Range   Lactic Acid, Venous 4.4 (HH) 0.5 - 1.9 mmol/L     Comment: Performed at [facility] [address], [place], [place] [address]  CBC with Differential     Status: Abnormal   Collection Time: 10/15/21  3:02 PM  Result Value Ref Range   WBC 18.6 (H) 4.0 - 10.5 K/uL   RBC 5.29 (H) 3.87 - 5.11 MIL/uL   Hemoglobin 14.7 12.0 - 15.0 g/dL   HCT 48.6 (H) 36.0 - 46.0 %   MCV 91.9 80.0 - 100.0 fL   MCH 27.8 26.0 - 34.0 pg   MCHC 30.2 30.0 - 36.0 g/dL   RDW 17.2 (H) 11.5 - 15.5 %   Platelets 487 (H) 150 - 400 K/uL   nRBC 0.2 0.0 - 0.2 %   Neutrophils Relative % 81 %   Neutro Abs 15.1 (H) 1.7 - 7.7 K/uL   Lymphocytes Relative 14 %   Lymphs Abs 2.7 0.7 - 4.0 K/uL   Monocytes Relative 3 %   Monocytes Absolute 0.6 0.1 - 1.0 K/uL   Eosinophils Relative 0 %   Eosinophils Absolute 0.0 0.0 - 0.5 K/uL   Basophils Relative 1 %   Basophils Absolute 0.1 0.0 - 0.1 K/uL   Immature Granulocytes 1 %   Abs Immature Granulocytes 0.10 (H) 0.00 - 0.07 K/uL    Comment: Performed at [facility] [address], [place],  [address]  Resp Panel by RT-PCR (Flu A&B, Covid) Nasopharyngeal Swab     Status: None   Collection Time: 10/15/21  3:03 PM   Specimen: Nasopharyngeal Swab; Nasopharyngeal(NP) swabs in vial transport medium  Result Value Ref Range   SARS Coronavirus 2 by RT PCR NEGATIVE NEGATIVE    Comment: (NOTE) SARS-CoV-2 target nucleic acids are NOT DETECTED.  The SARS-CoV-2 RNA is generally detectable in upper respiratory specimens during the acute phase of infection. The lowest concentration of SARS-CoV-2 viral copies this assay can detect is 138 copies/mL. A negative result does not preclude SARS-Cov-2 infection and should not be used as the sole basis for treatment or other patient management decisions. A negative result may occur with  improper specimen collection/handling, submission of specimen other than nasopharyngeal swab, presence of viral mutation(s) within the areas targeted by this assay, and inadequate number of  viral copies(<138 copies/mL). A negative result must be combined with clinical observations, patient history, and epidemiological information. The expected result is Negative.  Fact Sheet for Patients:  EntrepreneurPulse.com.au  Fact Sheet for Healthcare Providers:  IncredibleEmployment.be  This test is  no t yet approved or cleared by the [place] and  has been authorized for detection and/or diagnosis of SARS-CoV-2 by FDA under an Emergency Use Authorization (EUA). This EUA will remain  in effect (meaning this test can be used) for the duration of the COVID-19 declaration under Section 564(b)(1) of the Act, 21 U.S.C.section 360bbb-3(b)(1), unless the authorization is terminated  or revoked sooner.       Influenza A by PCR NEGATIVE NEGATIVE   Influenza B by PCR NEGATIVE NEGATIVE    Comment: (NOTE) The Xpert Xpress SARS-CoV-2/FLU/RSV plus assay is intended as an aid in the diagnosis of influenza from Nasopharyngeal swab specimens and should not be used as a sole basis for treatment. Nasal washings and aspirates are unacceptable for Xpert Xpress SARS-CoV-2/FLU/RSV testing.  Fact Sheet for Patients: EntrepreneurPulse.com.au  Fact Sheet for Healthcare Providers: IncredibleEmployment.be  This test is not yet approved or cleared by the [place] FDA and has been authorized for detection and/or diagnosis of SARS-CoV-2 by FDA under an Emergency Use Authorization (EUA). This EUA will remain in effect (meaning this test can be used) for the duration of the COVID-19 declaration under Section 564(b)(1) of the Act, 21 U.S.C. section 360bbb-3(b)(1), unless the authorization is terminated or revoked.  Performed at [facility], [place] [address] [place], [place] [address]   I-Stat beta hCG blood, ED     Status: None   Collection Time: 10/15/21  3:19 PM  Result Value Ref Range   I-stat hCG,  quantitative <5.0 <5 mIU/mL   Comment 3            Comment:   GEST. AGE      CONC.  (mIU/mL)   <=1 WEEK        5 - 50     2 WEEKS       50 - 500     3 WEEKS       100 - 10,000     4 WEEKS     1,000 - 30,000        FEMALE AND NON-PREGNANT FEMALE:     LESS THAN 5 mIU/mL   Culture, blood (Routine x 2)     Status: None (Preliminary result)   Collection Time: 10/15/21  5:04 PM   Specimen: BLOOD LEFT WRIST  Result Value Ref Range   Specimen Description BLOOD LEFT WRIST    Special Requests      BOTTLES DRAWN AEROBIC AND ANAEROBIC Blood Culture results may not be optimal due to an inadequate volume of blood received in culture bottles   Culture      NO GROWTH < 12 HOURS Performed at [facility] [address] [place], [place] [address]    Report Status PENDING   Culture, blood (Routine x 2)     Status: None (Preliminary result)   Collection Time: 10/15/21  5:10 PM   Specimen: BLOOD  Result Value Ref Range   Specimen Description BLOOD RIGHT ANTECUBITAL    Special Requests      BOTTLES DRAWN AEROBIC AND ANAEROBIC Blood Culture adequate volume   Culture      NO GROWTH < 12 HOURS Performed at [facility], [place] [address] [place], [place] [address]    Report Status PENDING   Lactic acid, plasma     Status: Abnormal   Collection Time: 10/15/21  9:08 PM  Result Value Ref Range   Lactic Acid, Venous >9.0 (HH) 0.5 - 1.9 mmol/L  Comment: CRITICAL VALUE NOTED.  VALUE IS CONSISTENT WITH PREVIOUSLY REPORTED AND CALLED VALUE. Performed at [facility], [place] [address] [place], [place] [address]   CBG monitoring, ED     Status: Abnormal   Collection Time: 10/15/21  9:14 PM  Result Value Ref Range   Glucose-Capillary 166 (H) 70 - 99 mg/dL    Comment: Glucose reference range applies only to samples taken after fasting for at least 8 hours.  Urinalysis, Routine w reflex microscopic     Status: Abnormal   Collection Time: 10/15/21 10:30 PM  Result Value Ref Range   Color,  Urine YELLOW YELLOW   APPearance CLOUDY (A) CLEAR   Specific Gravity, Urine 1.033 (H) 1.005 - 1.030   pH 5.0 5.0 - 8.0   Glucose, UA NEGATIVE NEGATIVE mg/dL   Hgb urine dipstick SMALL (A) NEGATIVE   Bilirubin Urine NEGATIVE NEGATIVE   Ketones, ur NEGATIVE NEGATIVE mg/dL   Protein, ur 30 (A) NEGATIVE mg/dL   Nitrite NEGATIVE NEGATIVE   Leukocytes,Ua LARGE (A) NEGATIVE   RBC / HPF 11-20 0 - 5 RBC/hpf   WBC, UA 11-20 0 - 5 WBC/hpf   Bacteria, UA MANY (A) NONE SEEN   Squamous Epithelial / LPF 0-5 0 - 5   Hyaline Casts, UA PRESENT     Comment: Performed at [facility], [address] [place], [place] [address]  C Difficile Quick Screen w PCR reflex     Status: Abnormal   Collection Time: 10/15/21 11:29 PM   Specimen: STOOL  Result Value Ref Range   C Diff antigen POSITIVE (A) NEGATIVE   C Diff toxin NEGATIVE NEGATIVE   C Diff interpretation Results are indeterminate. See PCR results.     Comment: Performed at [facility], [place] [address], [place], [place] [address]  C. Diff by PCR, Reflexed     Status: Abnormal   Collection Time: 10/15/21 11:29 PM  Result Value Ref Range   Toxigenic C. Difficile by PCR POSITIVE (A) NEGATIVE    Comment: Positive for toxigenic C. difficile with little to no toxin production. Only treat if clinical presentation suggests symptomatic illness. Performed at [facility], [place] [address] [place], [place] [address]   CBG monitoring, ED     Status: Abnormal   Collection Time: 10/16/21 12:30 AM  Result Value Ref Range   Glucose-Capillary 108 (H) 70 - 99 mg/dL    Comment: Glucose reference range applies only to samples taken after fasting for at least 8 hours.  Lactic acid, plasma     Status: None   Collection Time: 10/16/21  2:39 AM  Result Value Ref Range   Lactic Acid, Venous 1.7 0.5 - 1.9 mmol/L    Comment: Performed at [facility] [address] [place], [place] [address]  Procalcitonin     Status: None   Collection Time:  10/16/21  4:53 AM  Result Value Ref Range   Procalcitonin 13.07 ng/mL    Comment:        Interpretation: PCT >= 10 ng/mL: Important systemic inflammatory response, almost exclusively due to severe bacterial sepsis or septic shock. (NOTE)       Sepsis PCT Algorithm           Lower Respiratory Tract  Infection PCT Algorithm    ----------------------------     ----------------------------         PCT < 0.25 ng/mL                PCT < 0.10 ng/mL          Strongly encourage             Strongly discourage   discontinuation of antibiotics    initiation of antibiotics    ----------------------------     -----------------------------       PCT 0.25 - 0.50 ng/mL            PCT 0.10 - 0.25 ng/mL               OR       >80% decrease in PCT            Discourage initiation of                                            antibiotics      Encourage discontinuation           of antibiotics    ----------------------------     -----------------------------         PCT >= 0.50 ng/mL              PCT 0.26 - 0.50 ng/mL                AND       <80% decrease in PCT             Encourage initiation of                                             antibiotics       Encourage continuation           of antibiotics    ----------------------------     -----------------------------        PCT >= 0.50 ng/mL                  PCT > 0.50 ng/mL               AND         increase in PCT                  Strongly encourage                                      initiation of antibiotics    Strongly encourage escalation           of antibiotics                                     -----------------------------                                           PCT <= 0.25 ng/mL  OR                                        > 80% decrease in PCT                                      Discontinue / Do not initiate                                              antibiotics  Performed at [facility], [place] [address], [place], [place] [address]   Magnesium     Status: Abnormal   Collection Time: 10/16/21  4:53 AM  Result Value Ref Range   Magnesium 1.3 (L) 1.7 - 2.4 mg/dL    Comment: Performed at [facility] [address] [place], [place] [address]  Basic metabolic panel     Status: Abnormal   Collection Time: 10/16/21  4:53 AM  Result Value Ref Range   Sodium 138 135 - 145 mmol/L   Potassium 3.7 3.5 - 5.1 mmol/L   Chloride 112 (H) 98 - 111 mmol/L   CO2 19 (L) 22 - 32 mmol/L   Glucose, Bld 108 (H) 70 - 99 mg/dL    Comment: Glucose reference range applies only to samples taken after fasting for at least 8 hours.   BUN 15 6 - 20 mg/dL   Creatinine, Ser 1.18 (H) 0.44 - 1.00 mg/dL   Calcium 8.2 (L) 8.9 - 10.3 mg/dL    Comment: DELTA CHECK NOTED   GFR, Estimated 55 (L) >60 mL/min    Comment: (NOTE) Calculated using the CKD-EPI Creatinine Equation (2021)    Anion gap 7 5 - 15    Comment: Performed at [facility] [address] [place], [place] [address]  CBC     Status: Abnormal   Collection Time: 10/16/21  4:53 AM  Result Value Ref Range   WBC 12.2 (H) 4.0 - 10.5 K/uL   RBC 3.60 (L) 3.87 - 5.11 MIL/uL   Hemoglobin 10.3 (L) 12.0 - 15.0 g/dL    Comment: REPEATED TO VERIFY   HCT 32.2 (L) 36.0 - 46.0 %   MCV 89.4 80.0 - 100.0 fL   MCH 28.6 26.0 - 34.0 pg   MCHC 32.0 30.0 - 36.0 g/dL   RDW 17.3 (H) 11.5 - 15.5 %   Platelets 290 150 - 400 K/uL   nRBC 0.2 0.0 - 0.2 %    Comment: Performed at [facility] [address], [place], [place] [address]  CBG monitoring, ED     Status: Abnormal   Collection Time: 10/16/21  6:00 AM  Result Value Ref Range   Glucose-Capillary 109 (H) 70 - 99 mg/dL    Comment: Glucose reference range applies only to samples taken after fasting for at least 8 hours.  CBG monitoring, ED     Status: Abnormal   Collection Time: 10/16/21  7:36 AM  Result Value Ref Range    Glucose-Capillary 107 (H) 70 - 99 mg/dL    Comment: Glucose reference range applies only to samples taken after fasting for at least 8 hours.  CBG monitoring, ED  Status: Abnormal   Collection Time: 10/16/21 11:48 AM  Result Value Ref Range   Glucose-Capillary 163 (H) 70 - 99 mg/dL    Comment: Glucose reference range applies only to samples taken after fasting for at least 8 hours.   DG Chest 2 View  Result Date: 10/15/2021 CLINICAL DATA:  Nausea and vomiting EXAM: CHEST - 2 VIEW COMPARISON:  Chest x-ray 08/13/2021 FINDINGS: Heart size and mediastinal contours are within normal limits. No suspicious pulmonary opacities identified. No pleural effusion or pneumothorax visualized. No acute osseous abnormality appreciated. IMPRESSION: No acute intrathoracic process identified. Electronically Signed   By: Ofilia Neas M.D.   On: 10/15/2021 15:29   CT ABDOMEN PELVIS W CONTRAST  Result Date: 10/15/2021 CLINICAL DATA:  Abdominal pain, fever suspected C diff colitis evaluate for perforation EXAM: CT ABDOMEN AND PELVIS WITH CONTRAST TECHNIQUE: Multidetector CT imaging of the abdomen and pelvis was performed using the standard protocol following bolus administration of intravenous contrast. CONTRAST:  198mL OMNIPAQUE IOHEXOL 300 MG/ML  SOLN COMPARISON:  None. FINDINGS: Lower chest: No acute abnormality. Hepatobiliary: No focal liver abnormality. No gallstones, gallbladder wall thickening, or pericholecystic fluid. No biliary dilatation. Pancreas: No focal lesion. Normal pancreatic contour. No surrounding inflammatory changes. No main pancreatic ductal dilatation. Spleen: Normal in size without focal abnormality. Adrenals/Urinary Tract: No adrenal nodule bilaterally. Bilateral kidneys enhance symmetrically. Punctate calcifications within bilateral kidneys. No hydronephrosis. No hydroureter. The urinary bladder is decompressed. On delayed imaging, there is no urothelial wall thickening and there are no  filling defects in the opacified portions of the bilateral collecting systems or ureters. Stomach/Bowel: Stomach is within normal limits. No evidence of bowel wall thickening or dilatation. Fluid throughout the lumen of the colon. No associated bowel wall thickening or pericolonic fat stranding. No pneumatosis. Appendix appears in caliber with no inflammatory changing changes. Appendicoliths is noted within the appendiceal lumen (3:73). Vascular/Lymphatic: No abdominal aorta or iliac aneurysm. Mild atherosclerotic plaque of the aorta and its branches. No abdominal, pelvic, or inguinal lymphadenopathy. Reproductive: Status post hysterectomy. No adnexal masses. Other: No intraperitoneal free fluid. No intraperitoneal free gas. No organized fluid collection. Musculoskeletal: Tiny fat containing umbilical hernia. No suspicious lytic or blastic osseous lesions. No acute displaced fracture. Multilevel degenerative changes of the spine. IMPRESSION: 1. Fast transition state of the colon. No associated bowel perforation or findings of inflammation. 2. Nonobstructive punctate bilateral nephrolithiasis. 3.  Aortic Atherosclerosis (ICD10-I70.0). Electronically Signed   By: Iven Finn M.D.   On: 10/15/2021 17:48    Pending Labs Unresulted Labs (From admission, onward)     Start     Ordered   10/17/21 0500  Magnesium  Tomorrow morning,   R       Comments: Call MD if Magnesium <1.5    10/16/21 0707   10/17/21 0500  CBC  Tomorrow morning,   R        10/16/21 0707   10/17/21 0500  Comprehensive metabolic panel  Tomorrow morning,   R        10/16/21 0707   10/17/21 0500  Phosphorus  Tomorrow morning,   R        10/16/21 0707            Vitals/Pain Today's Vitals   10/16/21 0730 10/16/21 1030 10/16/21 1315 10/16/21 1330  BP: 102/65 (!) 104/53 124/81 118/79  Pulse: 98 99 (!) 102 98  Resp: 18 17 17 18   Temp: 98.8 F (37.1 C)     TempSrc: Oral  SpO2: 96% 96% 96% 98%  Weight:      Height:       PainSc:        Isolation Precautions Enteric precautions (UV disinfection)  Medications Medications  enoxaparin (LOVENOX) injection 40 mg (40 mg Subcutaneous Given 10/15/21 2121)  sodium chloride flush (NS) 0.9 % injection 3 mL (3 mLs Intravenous Given 10/16/21 0934)  acetaminophen (TYLENOL) tablet 650 mg (650 mg Oral Given 10/16/21 0222)    Or  acetaminophen (TYLENOL) suppository 650 mg ( Rectal See Alternative 10/16/21 0222)  ondansetron (ZOFRAN) tablet 4 mg (has no administration in time range)    Or  ondansetron (ZOFRAN) injection 4 mg (has no administration in time range)  vancomycin (VANCOCIN) capsule 125 mg (125 mg Oral Given 10/16/21 0934)    Followed by  vancomycin (VANCOCIN) capsule 125 mg (has no administration in time range)    Followed by  vancomycin (VANCOCIN) capsule 125 mg (has no administration in time range)    Followed by  vancomycin (VANCOCIN) capsule 125 mg (has no administration in time range)    Followed by  vancomycin (VANCOCIN) capsule 125 mg (has no administration in time range)  potassium chloride 10 mEq in 100 mL IVPB (10 mEq Intravenous Not Given 10/16/21 0935)  sodium chloride 0.9 % bolus 1,000 mL (0 mLs Intravenous Stopped 10/16/21 0636)    Followed by  0.9 %  sodium chloride infusion (0 mLs Intravenous Stopped 10/16/21 0634)  clonazePAM (KLONOPIN) tablet 0.5 mg (has no administration in time range)  DULoxetine (CYMBALTA) DR capsule 120 mg (120 mg Oral Given 10/16/21 0934)  gabapentin (NEURONTIN) capsule 300 mg (300 mg Oral Given 10/16/21 0021)  hydroxychloroquine (PLAQUENIL) tablet 200 mg (200 mg Oral Given 10/16/21 0934)  predniSONE (DELTASONE) tablet 20 mg (20 mg Oral Given 10/16/21 0934)  rosuvastatin (CRESTOR) tablet 10 mg (10 mg Oral Given 10/16/21 0021)  traZODone (DESYREL) tablet 75 mg (75 mg Oral Given 10/16/21 0021)  insulin aspart (novoLOG) injection 0-9 Units (2 Units Subcutaneous Given 10/16/21 1157)  ondansetron (ZOFRAN-ODT)  disintegrating tablet 4 mg (4 mg Oral Given 10/15/21 1713)  sodium chloride 0.9 % bolus 2,000 mL (0 mLs Intravenous Stopped 10/15/21 2132)  ceFEPIme (MAXIPIME) 2 g in sodium chloride 0.9 % 100 mL IVPB (0 g Intravenous Stopped 10/15/21 1745)  iohexol (OMNIPAQUE) 300 MG/ML solution 100 mL (100 mLs Intravenous Contrast Given 10/15/21 1721)  lactated ringers bolus 1,000 mL (0 mLs Intravenous Stopped 10/16/21 0825)  magnesium sulfate IVPB 4 g 100 mL (0 g Intravenous Stopped 10/16/21 1035)    Mobility walks Low fall risk   Focused Assessments Cardiac Assessment Handoff:  Cardiac Rhythm: Normal sinus rhythm, Sinus tachycardia Lab Results  Component Value Date   CKTOTAL 103 08/14/2021   No results found for: DDIMER Does the Patient currently have chest pain? No   , Neuro Assessment Handoff:  Swallow screen pass? No  Cardiac Rhythm: Normal sinus rhythm, Sinus tachycardia       Neuro Assessment: Within Defined Limits Neuro Checks:      Last Documented NIHSS Modified Score:   Has TPA been given? No If patient is a Neuro Trauma and patient is going to OR before floor call report to [place] nurse: [phone] or [phone]  , abd assessment   R Recommendations: See Admitting Provider Note  Report given to:   Additional Notes: none

## 2021-10-17 ENCOUNTER — Other Ambulatory Visit (HOSPITAL_COMMUNITY): Payer: Self-pay

## 2021-10-17 ENCOUNTER — Telehealth: Payer: Self-pay | Admitting: Internal Medicine

## 2021-10-17 DIAGNOSIS — N179 Acute kidney failure, unspecified: Secondary | ICD-10-CM | POA: Diagnosis not present

## 2021-10-17 DIAGNOSIS — A419 Sepsis, unspecified organism: Secondary | ICD-10-CM | POA: Diagnosis not present

## 2021-10-17 DIAGNOSIS — F418 Other specified anxiety disorders: Secondary | ICD-10-CM | POA: Diagnosis not present

## 2021-10-17 DIAGNOSIS — R197 Diarrhea, unspecified: Secondary | ICD-10-CM | POA: Diagnosis not present

## 2021-10-17 LAB — CBC
HCT: 31.8 % — ABNORMAL LOW (ref 36.0–46.0)
Hemoglobin: 10.1 g/dL — ABNORMAL LOW (ref 12.0–15.0)
MCH: 28.1 pg (ref 26.0–34.0)
MCHC: 31.8 g/dL (ref 30.0–36.0)
MCV: 88.6 fL (ref 80.0–100.0)
Platelets: 262 10*3/uL (ref 150–400)
RBC: 3.59 MIL/uL — ABNORMAL LOW (ref 3.87–5.11)
RDW: 17.2 % — ABNORMAL HIGH (ref 11.5–15.5)
WBC: 8.2 10*3/uL (ref 4.0–10.5)
nRBC: 0 % (ref 0.0–0.2)

## 2021-10-17 LAB — GLUCOSE, CAPILLARY
Glucose-Capillary: 134 mg/dL — ABNORMAL HIGH (ref 70–99)
Glucose-Capillary: 194 mg/dL — ABNORMAL HIGH (ref 70–99)

## 2021-10-17 LAB — COMPREHENSIVE METABOLIC PANEL
ALT: 17 U/L (ref 0–44)
AST: 12 U/L — ABNORMAL LOW (ref 15–41)
Albumin: 3 g/dL — ABNORMAL LOW (ref 3.5–5.0)
Alkaline Phosphatase: 51 U/L (ref 38–126)
Anion gap: 9 (ref 5–15)
BUN: 6 mg/dL (ref 6–20)
CO2: 23 mmol/L (ref 22–32)
Calcium: 9 mg/dL (ref 8.9–10.3)
Chloride: 112 mmol/L — ABNORMAL HIGH (ref 98–111)
Creatinine, Ser: 0.8 mg/dL (ref 0.44–1.00)
GFR, Estimated: >60 mL/min (ref >60)
Glucose, Bld: 119 mg/dL — ABNORMAL HIGH (ref 70–99)
Potassium: 3.6 mmol/L (ref 3.5–5.1)
Sodium: 144 mmol/L (ref 135–145)
Total Bilirubin: 0.8 mg/dL (ref 0.3–1.2)
Total Protein: 5.8 g/dL — ABNORMAL LOW (ref 6.5–8.1)

## 2021-10-17 LAB — PHOSPHORUS: Phosphorus: 2.9 mg/dL (ref 2.5–4.6)

## 2021-10-17 LAB — MAGNESIUM: Magnesium: 2.2 mg/dL (ref 1.7–2.4)

## 2021-10-17 MED ORDER — VANCOMYCIN HCL 125 MG PO CAPS
ORAL_CAPSULE | ORAL | 30 refills | Status: DC
  Filled 2021-10-17: qty 78, 38d supply, fill #0

## 2021-10-17 NOTE — Telephone Encounter (Signed)
Diarrhea that responds to vancomycin but not 100% clear it was C diff    Hospitalized 2 x  Needs f/u me in 2-3 weeks ? May not need long vanco taper  Also has hx large polyp that needs close f/u

## 2021-10-17 NOTE — Plan of Care (Signed)
  Problem: Education: Goal: Knowledge of General Education information will improve Description: Including pain rating scale, medication(s)/side effects and non-pharmacologic comfort measures Outcome: Adequate for Discharge   Problem: Health Behavior/Discharge Planning: Goal: Ability to manage health-related needs will improve Outcome: Adequate for Discharge   Problem: Clinical Measurements: Goal: Ability to maintain clinical measurements within normal limits will improve Outcome: Adequate for Discharge Goal: Will remain free from infection Outcome: Adequate for Discharge Goal: Diagnostic test results will improve Outcome: Adequate for Discharge Goal: Respiratory complications will improve Outcome: Adequate for Discharge Goal: Cardiovascular complication will be avoided Outcome: Adequate for Discharge   Problem: Activity: Goal: Risk for activity intolerance will decrease Outcome: Adequate for Discharge   Problem: Nutrition: Goal: Adequate nutrition will be maintained Outcome: Adequate for Discharge   Problem: Coping: Goal: Level of anxiety will decrease Outcome: Adequate for Discharge   Problem: Pain Managment: Goal: General experience of comfort will improve Outcome: Adequate for Discharge

## 2021-10-17 NOTE — Discharge Summary (Addendum)
PATIENT DETAILS Name: Diana Herman Age: 55 y.o. Sex: female Date of Birth: Dec 02, 1965 MRN: [id number]. Admitting Physician: Lenore Cordia, MD [id number], Shyrl Numbers, MD  Admit Date: 10/15/2021 Discharge date: 10/17/2021  Recommendations for Outpatient Follow-up:  Follow up with PCP in 1-2 weeks Please obtain CMP/CBC in one week Please ensure follow-up with GI Minimize use of antibiotics as much as possible.  Admitted From:  Home  Disposition: [place]: No  Equipment/Devices: None  Discharge Condition: Stable  CODE STATUS: FULL CODE  Diet recommendation:  Diet Order             Diet Carb Modified Fluid consistency: Thin; Room service appropriate? Yes  Diet effective now           Diet - low sodium heart healthy           Diet Carb Modified                    Brief Summary: Patient is a 55 y.o. female with history of PMR on chronic steroids, DM-2, HTN, HLD, OSA on CPAP, endometrial cancer-s/p robotic hysterectomy and bilateral salpingo-oophorectomy-recent hospitalization in September for presumed C. difficile colitis-presented with diarrhea-thought to have recurrence of C. difficile colitis.  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  Diarrhea has essentially resolved after starting oral vancomycin.  High suspicion that this is a recurrent C. difficile colitis-although her C. difficile antigen was negative-however she responded to vancomycin treatment and is immunocompromised on chronic steroid treatment.  Plans are to continue oral vancomycin taper on discharge-I have spoken with her outpatient gastroenterologist-Dr. Leland Johns will see her in the office in a few weeks and decide on appropriate length of vancomycin therapy.  Patient has been counseled extensively regarding importance of minimizing antibiotic use-she was  apparently prescribed a 10-day course of Keflex for pyelonephritis approximately 3 weeks prior to this hospitalization.    AKI: Hemodynamically mediated due to diarrhea-resolved with IVF   Hypotension: Due to volume loss and sepsis-resolved with supportive care.   Hypokalemia: Due to GI loss-repleted.   Hypercalcemia: Due to dehydration-improved with IVF.  Continue to monitor closely in the outpatient setting.   HLD: Continue statin   DM-2 (A1c 6.7 on 9/25): CBG stable-continue SSI-resume metformin on discharge.  Depression/anxiety: Mood appears to be stable-continue Cymbalta/as needed Klonopin.   OSA: CPAP nightly   PMR: Continue steroids and Plaquenil.  Follow with primary rheumatology as previous.   Morbid Obesity Estimated body mass index is 40.91 kg/m as calculated from the following:   Height as of this encounter: 5\' 5"  (1.651 m).   Weight as of this encounter: 111.5 kg.    Procedures None  Discharge Diagnoses:  Principal Problem:   Severe sepsis with acute organ dysfunction (HCC) Active Problems:   Mixed hyperlipidemia   OSA on CPAP   Polymyalgia rheumatica (HCC)   AKI (acute kidney injury) ([place])   Type 2 diabetes mellitus without complication (HCC)   Hypotension   Hypercalcemia   Hypokalemia   Depression with anxiety   Discharge Instructions:  Activity:  As tolerated   Discharge Instructions     Call MD for:   Complete by: As directed    diarrhea   Call MD for:  persistant nausea and vomiting   Complete by: As directed    Diet - low sodium heart healthy   Complete by: As directed  Diet Carb Modified   Complete by: As directed    Discharge instructions   Complete by: As directed    Follow with Primary MD  Ryter-Brown, Shyrl Numbers, MD in 1-2 weeks  You will receive a phone call from gastroenterology's office for a follow-up appointment.  Please get a complete blood count and chemistry panel checked by your Primary MD at your next visit, and again  as instructed by your Primary MD.  Get Medicines reviewed and adjusted: Please take all your medications with you for your next visit with your Primary MD  Laboratory/radiological data: Please request your Primary MD to go over all hospital tests and procedure/radiological results at the follow up, please ask your Primary MD to get all Hospital records sent to his/her office.  In some cases, they will be blood work, cultures and biopsy results pending at the time of your discharge. Please request that your primary care M.D. follows up on these results.  Also Note the following: If you experience worsening of your admission symptoms, develop shortness of breath, life threatening emergency, suicidal or homicidal thoughts you must seek medical attention immediately by calling 911 or calling your MD immediately  if symptoms less severe.  You must read complete instructions/literature along with all the possible adverse reactions/side effects for all the Medicines you take and that have been prescribed to you. Take any new Medicines after you have completely understood and accpet all the possible adverse reactions/side effects.   Do not drive when taking Pain medications or sleeping medications (Benzodaizepines)  Do not take more than prescribed Pain, Sleep and Anxiety Medications. It is not advisable to combine anxiety,sleep and pain medications without talking with your primary care practitioner  Special Instructions: If you have smoked or chewed Tobacco  in the last 2 yrs please stop smoking, stop any regular Alcohol  and or any Recreational drug use.  Wear Seat belts while driving.  Please note: You were cared for by a hospitalist during your hospital stay. Once you are discharged, your primary care physician will handle any further medical issues. Please note that NO REFILLS for any discharge medications will be authorized once you are discharged, as it is imperative that you return to your  primary care physician (or establish a relationship with a primary care physician if you do not have one) for your post hospital discharge needs so that they can reassess your need for medications and monitor your lab values.   Minimize use of antibiotics as much as possible.   Increase activity slowly   Complete by: As directed       Allergies as of 10/17/2021       Reactions   Other Other (See Comments)   Apri causes depression        Medication List     TAKE these medications    alendronate 35 MG tablet Commonly known as: FOSAMAX Take 35 mg by mouth every Sunday.   Calcium-Vitamin D 500-400 MG-UNIT Tabs Take 1 tablet by mouth See admin instructions. Bid  6 days a week(does not take on Sunday)   cholecalciferol 25 MCG (1000 UNIT) tablet Commonly known as: VITAMIN D Take 1,000 Units by mouth at bedtime.   clonazePAM 0.5 MG tablet Commonly known as: KLONOPIN Take 0.5 mg by mouth 2 (two) times daily as needed for anxiety.   DULoxetine 60 MG capsule Commonly known as: CYMBALTA Take 120 mg by mouth daily.   ferrous gluconate 324 MG tablet Commonly known as: [employer]  Take 324 mg by mouth daily.   gabapentin 300 MG capsule Commonly known as: NEURONTIN Take 300 mg by mouth at bedtime.   hydroxychloroquine 200 MG tablet Commonly known as: PLAQUENIL Take 200 mg by mouth 2 (two) times daily.   ibuprofen 200 MG tablet Commonly known as: ADVIL Take 800 mg by mouth every 6 (six) hours as needed for headache or moderate pain.   metFORMIN 500 MG tablet Commonly known as: GLUCOPHAGE Take 1,000 mg by mouth daily with breakfast.   pilocarpine 5 MG tablet Commonly known as: SALAGEN Take 5 mg by mouth See admin instructions. Monday-friday   predniSONE 10 MG tablet Commonly known as: DELTASONE Take 20 mg by mouth daily.   rosuvastatin 10 MG tablet Commonly known as: CRESTOR Take 10 mg by mouth at bedtime.   SYSTANE OP Place 1 drop into both eyes daily.   traZODone  50 MG tablet Commonly known as: DESYREL Take 75 mg by mouth at bedtime.   TYLENOL 500 MG tablet Generic drug: acetaminophen Take 1,000 mg by mouth every 6 (six) hours as needed for headache.   vancomycin 125 MG capsule Commonly known as: VANCOCIN Take 1 capsule (125mg ) by mouth 4 times daily until 10/28/21. On 10/29/21 start 1 capsule 2 times daily for 7 days, then 1 cap once daily for 7 days, then 1 cap every other day for 7 days, then 1 cap every 3 days for 7 days.        Follow-up Information     Ryter-Brown, Shyrl Numbers, MD. Schedule an appointment as soon as possible for a visit in 1 week(s).   Specialty: Family Medicine Contact information: [place] [address] [phone]         Gatha Mayer, MD Follow up.   Specialty: Gastroenterology Why: Office will call with date/time, If you dont hear from them,please give them a call Contact information: [address] [address] [phone]                Allergies  Allergen Reactions   Other Other (See Comments)    Apri causes depression      Consultations: None   Other Procedures/Studies: DG Chest 2 View  Result Date: 10/15/2021 CLINICAL DATA:  Nausea and vomiting EXAM: CHEST - 2 VIEW COMPARISON:  Chest x-ray 08/13/2021 FINDINGS: Heart size and mediastinal contours are within normal limits. No suspicious pulmonary opacities identified. No pleural effusion or pneumothorax visualized. No acute osseous abnormality appreciated. IMPRESSION: No acute intrathoracic process identified. Electronically Signed   By: Ofilia Neas M.D.   On: 10/15/2021 15:29   CT ABDOMEN PELVIS W CONTRAST  Result Date: 10/15/2021 CLINICAL DATA:  Abdominal pain, fever suspected C diff colitis evaluate for perforation EXAM: CT ABDOMEN AND PELVIS WITH CONTRAST TECHNIQUE: Multidetector CT imaging of the abdomen and pelvis was performed using the standard protocol following bolus  administration of intravenous contrast. CONTRAST:  165mL OMNIPAQUE IOHEXOL 300 MG/ML  SOLN COMPARISON:  None. FINDINGS: Lower chest: No acute abnormality. Hepatobiliary: No focal liver abnormality. No gallstones, gallbladder wall thickening, or pericholecystic fluid. No biliary dilatation. Pancreas: No focal lesion. Normal pancreatic contour. No surrounding inflammatory changes. No main pancreatic ductal dilatation. Spleen: Normal in size without focal abnormality. Adrenals/Urinary Tract: No adrenal nodule bilaterally. Bilateral kidneys enhance symmetrically. Punctate calcifications within bilateral kidneys. No hydronephrosis. No hydroureter. The urinary bladder is decompressed. On delayed imaging, there is no urothelial wall thickening and there are no filling defects in the  opacified portions of the bilateral collecting systems or ureters. Stomach/Bowel: Stomach is within normal limits. No evidence of bowel wall thickening or dilatation. Fluid throughout the lumen of the colon. No associated bowel wall thickening or pericolonic fat stranding. No pneumatosis. Appendix appears in caliber with no inflammatory changing changes. Appendicoliths is noted within the appendiceal lumen (3:73). Vascular/Lymphatic: No abdominal aorta or iliac aneurysm. Mild atherosclerotic plaque of the aorta and its branches. No abdominal, pelvic, or inguinal lymphadenopathy. Reproductive: Status post hysterectomy. No adnexal masses. Other: No intraperitoneal free fluid. No intraperitoneal free gas. No organized fluid collection. Musculoskeletal: Tiny fat containing umbilical hernia. No suspicious lytic or blastic osseous lesions. No acute displaced fracture. Multilevel degenerative changes of the spine. IMPRESSION: 1. Fast transition state of the colon. No associated bowel perforation or findings of inflammation. 2. Nonobstructive punctate bilateral nephrolithiasis. 3.  Aortic Atherosclerosis (ICD10-I70.0). Electronically Signed   By:  Iven Finn M.D.   On: 10/15/2021 17:48     TODAY-DAY OF DISCHARGE:  Subjective:   Adhya Chaviano today has no headache,no chest abdominal pain,no new weakness tingling or numbness, feels much better wants to go home today.   Objective:   Blood pressure 107/73, pulse 80, temperature 98 F (36.7 C), temperature source Oral, resp. rate 18, height 5\' 5"  (1.651 m), weight 114.8 kg, last menstrual period 03/21/2012, SpO2 93 %.  Intake/Output Summary (Last 24 hours) at 10/17/2021 1135 Last data filed at 10/16/2021 1704 Gross per 24 hour  Intake 240 ml  Output --  Net 240 ml   Filed Weights   10/15/21 1600 10/16/21 1412  Weight: 111.5 kg 114.8 kg    Exam: Awake Alert, Oriented *3, No new F.N deficits, Normal affect [place].AT,PERRAL Supple Neck,No JVD, No cervical lymphadenopathy appriciated.  Symmetrical Chest wall movement, Good air movement bilaterally, CTAB RRR,No Gallops,Rubs or new Murmurs, No Parasternal Heave +ve B.Sounds, Abd Soft, Non tender, No organomegaly appriciated, No rebound -guarding or rigidity. No Cyanosis, Clubbing or edema, No new Rash or bruise   PERTINENT RADIOLOGIC STUDIES: DG Chest 2 View  Result Date: 10/15/2021 CLINICAL DATA:  Nausea and vomiting EXAM: CHEST - 2 VIEW COMPARISON:  Chest x-ray 08/13/2021 FINDINGS: Heart size and mediastinal contours are within normal limits. No suspicious pulmonary opacities identified. No pleural effusion or pneumothorax visualized. No acute osseous abnormality appreciated. IMPRESSION: No acute intrathoracic process identified. Electronically Signed   By: Ofilia Neas M.D.   On: 10/15/2021 15:29   CT ABDOMEN PELVIS W CONTRAST  Result Date: 10/15/2021 CLINICAL DATA:  Abdominal pain, fever suspected C diff colitis evaluate for perforation EXAM: CT ABDOMEN AND PELVIS WITH CONTRAST TECHNIQUE: Multidetector CT imaging of the abdomen and pelvis was performed using the standard protocol following bolus administration of  intravenous contrast. CONTRAST:  122mL OMNIPAQUE IOHEXOL 300 MG/ML  SOLN COMPARISON:  None. FINDINGS: Lower chest: No acute abnormality. Hepatobiliary: No focal liver abnormality. No gallstones, gallbladder wall thickening, or pericholecystic fluid. No biliary dilatation. Pancreas: No focal lesion. Normal pancreatic contour. No surrounding inflammatory changes. No main pancreatic ductal dilatation. Spleen: Normal in size without focal abnormality. Adrenals/Urinary Tract: No adrenal nodule bilaterally. Bilateral kidneys enhance symmetrically. Punctate calcifications within bilateral kidneys. No hydronephrosis. No hydroureter. The urinary bladder is decompressed. On delayed imaging, there is no urothelial wall thickening and there are no filling defects in the opacified portions of the bilateral collecting systems or ureters. Stomach/Bowel: Stomach is within normal limits. No evidence of bowel wall thickening or dilatation. Fluid throughout the lumen of the colon. No  associated bowel wall thickening or pericolonic fat stranding. No pneumatosis. Appendix appears in caliber with no inflammatory changing changes. Appendicoliths is noted within the appendiceal lumen (3:73). Vascular/Lymphatic: No abdominal aorta or iliac aneurysm. Mild atherosclerotic plaque of the aorta and its branches. No abdominal, pelvic, or inguinal lymphadenopathy. Reproductive: Status post hysterectomy. No adnexal masses. Other: No intraperitoneal free fluid. No intraperitoneal free gas. No organized fluid collection. Musculoskeletal: Tiny fat containing umbilical hernia. No suspicious lytic or blastic osseous lesions. No acute displaced fracture. Multilevel degenerative changes of the spine. IMPRESSION: 1. Fast transition state of the colon. No associated bowel perforation or findings of inflammation. 2. Nonobstructive punctate bilateral nephrolithiasis. 3.  Aortic Atherosclerosis (ICD10-I70.0). Electronically Signed   By: Iven Finn M.D.    On: 10/15/2021 17:48     PERTINENT LAB RESULTS: CBC: Recent Labs    10/16/21 0453 10/17/21 0335  WBC 12.2* 8.2  HGB 10.3* 10.1*  HCT 32.2* 31.8*  PLT 290 262   CMET CMP     Component Value Date/Time   NA 144 10/17/2021 0335   K 3.6 10/17/2021 0335   CL 112 (H) 10/17/2021 0335   CO2 23 10/17/2021 0335   GLUCOSE 119 (H) 10/17/2021 0335   BUN 6 10/17/2021 0335   CREATININE 0.80 10/17/2021 0335   CALCIUM 9.0 10/17/2021 0335   PROT 5.8 (L) 10/17/2021 0335   ALBUMIN 3.0 (L) 10/17/2021 0335   AST 12 (L) 10/17/2021 0335   ALT 17 10/17/2021 0335   ALKPHOS 51 10/17/2021 0335   BILITOT 0.8 10/17/2021 0335   GFRNONAA >60 10/17/2021 0335    GFR Estimated Creatinine Clearance: 102.8 mL/min (by C-G formula based on SCr of 0.8 mg/dL). No results for input(s): LIPASE, AMYLASE in the last 72 hours. No results for input(s): CKTOTAL, CKMB, CKMBINDEX, TROPONINI in the last 72 hours. Invalid input(s): POCBNP No results for input(s): DDIMER in the last 72 hours. No results for input(s): HGBA1C in the last 72 hours. No results for input(s): CHOL, HDL, LDLCALC, TRIG, CHOLHDL, LDLDIRECT in the last 72 hours. No results for input(s): TSH, T4TOTAL, T3FREE, THYROIDAB in the last 72 hours.  Invalid input(s): FREET3 No results for input(s): VITAMINB12, FOLATE, FERRITIN, TIBC, IRON, RETICCTPCT in the last 72 hours. Coags: No results for input(s): INR in the last 72 hours.  Invalid input(s): PT Microbiology: Recent Results (from the past 240 hour(s))  Resp Panel by RT-PCR (Flu A&B, Covid) Nasopharyngeal Swab     Status: None   Collection Time: 10/15/21  3:03 PM   Specimen: Nasopharyngeal Swab; Nasopharyngeal(NP) swabs in vial transport medium  Result Value Ref Range Status   SARS Coronavirus 2 by RT PCR NEGATIVE NEGATIVE Final    Comment: (NOTE) SARS-CoV-2 target nucleic acids are NOT DETECTED.  The SARS-CoV-2 RNA is generally detectable in upper respiratory specimens during the acute  phase of infection. The lowest concentration of SARS-CoV-2 viral copies this assay can detect is 138 copies/mL. A negative result does not preclude SARS-Cov-2 infection and should not be used as the sole basis for treatment or other patient management decisions. A negative result may occur with  improper specimen collection/handling, submission of specimen other than nasopharyngeal swab, presence of viral mutation(s) within the areas targeted by this assay, and inadequate number of viral copies(<138 copies/mL). A negative result must be combined with clinical observations, patient history, and epidemiological information. The expected result is Negative.  Fact Sheet for Patients:  EntrepreneurPulse.com.au  Fact Sheet for Healthcare Providers:  IncredibleEmployment.be  This test is  no t yet approved or cleared by the [place] and  has been authorized for detection and/or diagnosis of SARS-CoV-2 by FDA under an Emergency Use Authorization (EUA). This EUA will remain  in effect (meaning this test can be used) for the duration of the COVID-19 declaration under Section 564(b)(1) of the Act, 21 U.S.C.section 360bbb-3(b)(1), unless the authorization is terminated  or revoked sooner.       Influenza A by PCR NEGATIVE NEGATIVE Final   Influenza B by PCR NEGATIVE NEGATIVE Final    Comment: (NOTE) The Xpert Xpress SARS-CoV-2/FLU/RSV plus assay is intended as an aid in the diagnosis of influenza from Nasopharyngeal swab specimens and should not be used as a sole basis for treatment. Nasal washings and aspirates are unacceptable for Xpert Xpress SARS-CoV-2/FLU/RSV testing.  Fact Sheet for Patients: EntrepreneurPulse.com.au  Fact Sheet for Healthcare Providers: IncredibleEmployment.be  This test is not yet approved or cleared by the [place] FDA and has been authorized for detection and/or diagnosis of  SARS-CoV-2 by FDA under an Emergency Use Authorization (EUA). This EUA will remain in effect (meaning this test can be used) for the duration of the COVID-19 declaration under Section 564(b)(1) of the Act, 21 U.S.C. section 360bbb-3(b)(1), unless the authorization is terminated or revoked.  Performed at [facility] [facility], [place] [address], [place], [place] [address]   Culture, blood (Routine x 2)     Status: None (Preliminary result)   Collection Time: 10/15/21  5:04 PM   Specimen: BLOOD LEFT WRIST  Result Value Ref Range Status   Specimen Description BLOOD LEFT WRIST  Final   Special Requests   Final    BOTTLES DRAWN AEROBIC AND ANAEROBIC Blood Culture results may not be optimal due to an inadequate volume of blood received in culture bottles   Culture   Final    NO GROWTH 2 DAYS Performed at [facility], [place] [address] [place], [place] [address]    Report Status PENDING  Incomplete  Culture, blood (Routine x 2)     Status: None (Preliminary result)   Collection Time: 10/15/21  5:10 PM   Specimen: BLOOD  Result Value Ref Range Status   Specimen Description BLOOD RIGHT ANTECUBITAL  Final   Special Requests   Final    BOTTLES DRAWN AEROBIC AND ANAEROBIC Blood Culture adequate volume   Culture   Final    NO GROWTH 2 DAYS Performed at [facility], [place] [address] [place], [place] [address]    Report Status PENDING  Incomplete  C Difficile Quick Screen w PCR reflex     Status: Abnormal   Collection Time: 10/15/21 11:29 PM   Specimen: STOOL  Result Value Ref Range Status   C Diff antigen POSITIVE (A) NEGATIVE Final   C Diff toxin NEGATIVE NEGATIVE Final   C Diff interpretation Results are indeterminate. See PCR results.  Final    Comment: Performed at [facility], [place] [address] [place], [place] [address]  C. Diff by PCR, Reflexed     Status: Abnormal   Collection Time: 10/15/21 11:29 PM  Result Value Ref Range Status   Toxigenic C. Difficile by PCR  POSITIVE (A) NEGATIVE Final    Comment: Positive for toxigenic C. difficile with little to no toxin production. Only treat if clinical presentation suggests symptomatic illness. Performed at [facility], [place] [address], [place],  [address]     FURTHER DISCHARGE INSTRUCTIONS:  Get Medicines reviewed  and adjusted: Please take all your medications with you for your next visit with your Primary MD  Laboratory/radiological data: Please request your Primary MD to go over all hospital tests and procedure/radiological results at the follow up, please ask your Primary MD to get all Hospital records sent to his/her office.  In some cases, they will be blood work, cultures and biopsy results pending at the time of your discharge. Please request that your primary care M.D. goes through all the records of your hospital data and follows up on these results.  Also Note the following: If you experience worsening of your admission symptoms, develop shortness of breath, life threatening emergency, suicidal or homicidal thoughts you must seek medical attention immediately by calling 911 or calling your MD immediately  if symptoms less severe.  You must read complete instructions/literature along with all the possible adverse reactions/side effects for all the Medicines you take and that have been prescribed to you. Take any new Medicines after you have completely understood and accpet all the possible adverse reactions/side effects.   Do not drive when taking Pain medications or sleeping medications (Benzodaizepines)  Do not take more than prescribed Pain, Sleep and Anxiety Medications. It is not advisable to combine anxiety,sleep and pain medications without talking with your primary care practitioner  Special Instructions: If you have smoked or chewed Tobacco  in the last 2 yrs please stop smoking, stop any regular Alcohol  and or any Recreational drug use.  Wear Seat belts while  driving.  Please note: You were cared for by a hospitalist during your hospital stay. Once you are discharged, your primary care physician will handle any further medical issues. Please note that NO REFILLS for any discharge medications will be authorized once you are discharged, as it is imperative that you return to your primary care physician (or establish a relationship with a primary care physician if you do not have one) for your post hospital discharge needs so that they can reassess your need for medications and monitor your lab values.  Total Time spent coordinating discharge including counseling, education and face to face time equals 35 minutes.  SignedOren Binet 10/17/2021 11:35 AM

## 2021-10-17 NOTE — Telephone Encounter (Signed)
Patient discharged from hospital today.  I spoke to the hospitalist and made some notes above about why they were asking for follow-up.  She also has a history of a colon polyp that needs a follow-up.  Please look for an app visit the week of December 12 versus overbooking me if nothing is available.

## 2021-10-19 NOTE — Telephone Encounter (Signed)
I left her a detailed message to call us back and set up an APP appointment for the week of Dec 12th, currently we have openings.

## 2021-10-20 LAB — CULTURE, BLOOD (ROUTINE X 2)
Culture: NO GROWTH
Culture: NO GROWTH
Special Requests: ADEQUATE

## 2021-10-24 NOTE — Telephone Encounter (Signed)
I left her another detailed message to please call because we are running out of appointments for the week of the 12th and Dr Carlean Purl requested she be seen that week.

## 2021-10-27 NOTE — Telephone Encounter (Signed)
I have left another detailed message to please call us for appointment.

## 2021-11-01 NOTE — Telephone Encounter (Signed)
Letter is being mailed to patient to please call us to set up appointment.

## 2021-11-19 ENCOUNTER — Encounter (HOSPITAL_COMMUNITY): Payer: Self-pay

## 2021-11-19 ENCOUNTER — Emergency Department (HOSPITAL_COMMUNITY)
Admission: EM | Admit: 2021-11-19 | Discharge: 2021-11-19 | Disposition: A | Discharge status: Home / Self Care | Payer: BC Managed Care – PPO | Attending: Emergency Medicine | Admitting: Emergency Medicine

## 2021-11-19 ENCOUNTER — Other Ambulatory Visit: Payer: Self-pay

## 2021-11-19 DIAGNOSIS — R0981 Nasal congestion: Secondary | ICD-10-CM | POA: Diagnosis present

## 2021-11-19 DIAGNOSIS — D72829 Elevated white blood cell count, unspecified: Secondary | ICD-10-CM | POA: Diagnosis not present

## 2021-11-19 DIAGNOSIS — U071 COVID-19: Secondary | ICD-10-CM | POA: Diagnosis not present

## 2021-11-19 DIAGNOSIS — R109 Unspecified abdominal pain: Secondary | ICD-10-CM | POA: Insufficient documentation

## 2021-11-19 DIAGNOSIS — R000 Tachycardia, unspecified: Secondary | ICD-10-CM | POA: Insufficient documentation

## 2021-11-19 DIAGNOSIS — E860 Dehydration: Secondary | ICD-10-CM | POA: Insufficient documentation

## 2021-11-19 HISTORY — DX: COVID-19: U07.1

## 2021-11-19 LAB — CBC WITH DIFFERENTIAL/PLATELET
Abs Immature Granulocytes: 0.04 10*3/uL (ref 0.00–0.07)
Basophils Absolute: 0 10*3/uL (ref 0.0–0.1)
Basophils Relative: 0 %
Eosinophils Absolute: 0.2 10*3/uL (ref 0.0–0.5)
Eosinophils Relative: 1 %
HCT: 38.1 % (ref 36.0–46.0)
Hemoglobin: 12.2 g/dL (ref 12.0–15.0)
Immature Granulocytes: 0 %
Lymphocytes Relative: 18 %
Lymphs Abs: 2.4 10*3/uL (ref 0.7–4.0)
MCH: 29.2 pg (ref 26.0–34.0)
MCHC: 32 g/dL (ref 30.0–36.0)
MCV: 91.1 fL (ref 80.0–100.0)
Monocytes Absolute: 0.9 10*3/uL (ref 0.1–1.0)
Monocytes Relative: 7 %
Neutro Abs: 9.9 10*3/uL — ABNORMAL HIGH (ref 1.7–7.7)
Neutrophils Relative %: 74 %
Platelets: 303 10*3/uL (ref 150–400)
RBC: 4.18 MIL/uL (ref 3.87–5.11)
RDW: 15.9 % — ABNORMAL HIGH (ref 11.5–15.5)
WBC: 13.5 10*3/uL — ABNORMAL HIGH (ref 4.0–10.5)
nRBC: 0 % (ref 0.0–0.2)

## 2021-11-19 LAB — COMPREHENSIVE METABOLIC PANEL
ALT: 41 U/L (ref 0–44)
AST: 26 U/L (ref 15–41)
Albumin: 3.7 g/dL (ref 3.5–5.0)
Alkaline Phosphatase: 68 U/L (ref 38–126)
Anion gap: 10 (ref 5–15)
BUN: 19 mg/dL (ref 6–20)
CO2: 23 mmol/L (ref 22–32)
Calcium: 9.2 mg/dL (ref 8.9–10.3)
Chloride: 104 mmol/L (ref 98–111)
Creatinine, Ser: 1.12 mg/dL — ABNORMAL HIGH (ref 0.44–1.00)
GFR, Estimated: 59 mL/min — ABNORMAL LOW (ref >60)
Glucose, Bld: 179 mg/dL — ABNORMAL HIGH (ref 70–99)
Potassium: 3.8 mmol/L (ref 3.5–5.1)
Sodium: 137 mmol/L (ref 135–145)
Total Bilirubin: 0.7 mg/dL (ref 0.3–1.2)
Total Protein: 7 g/dL (ref 6.5–8.1)

## 2021-11-19 LAB — MAGNESIUM: Magnesium: 1.5 mg/dL — ABNORMAL LOW (ref 1.7–2.4)

## 2021-11-19 LAB — RESP PANEL BY RT-PCR (FLU A&B, COVID) ARPGX2
Influenza A by PCR: NEGATIVE
Influenza B by PCR: NEGATIVE
SARS Coronavirus 2 by RT PCR: POSITIVE — AB

## 2021-11-19 LAB — C DIFFICILE QUICK SCREEN W PCR REFLEX
C Diff antigen: NEGATIVE
C Diff interpretation: NOT DETECTED
C Diff toxin: NEGATIVE

## 2021-11-19 LAB — LIPASE, BLOOD: Lipase: 65 U/L — ABNORMAL HIGH (ref 11–51)

## 2021-11-19 LAB — LACTIC ACID, PLASMA
Lactic Acid, Venous: 3 mmol/L (ref 0.5–1.9)
Lactic Acid, Venous: 2 mmol/L (ref 0.5–1.9)

## 2021-11-19 MED ORDER — ONDANSETRON HCL 4 MG/2ML IJ SOLN
4.0000 mg | Freq: Once | INTRAMUSCULAR | Status: AC
  Administered 2021-11-19: 4 mg via INTRAVENOUS
  Filled 2021-11-19: qty 2

## 2021-11-19 MED ORDER — MAGNESIUM SULFATE 2 GM/50ML IV SOLN
2.0000 g | Freq: Once | INTRAVENOUS | Status: AC
  Administered 2021-11-19: 2 g via INTRAVENOUS
  Filled 2021-11-19: qty 50

## 2021-11-19 MED ORDER — LACTATED RINGERS IV BOLUS
1000.0000 mL | Freq: Once | INTRAVENOUS | Status: AC
  Administered 2021-11-19: 1000 mL via INTRAVENOUS

## 2021-11-19 MED ORDER — MAGNESIUM OXIDE -MG SUPPLEMENT 200 MG PO TABS: 200.0000 mg | ORAL_TABLET | Freq: Every day | ORAL | 0 refills | Status: DC

## 2021-11-19 NOTE — ED Notes (Signed)
2nd lactic will be drawn post completion of the lactic ringers bolus.

## 2021-11-19 NOTE — ED Provider Notes (Signed)
Emergency Medicine Provider Triage Evaluation Note  Diana Herman , a 56 y.o. female  was evaluated in triage.  Pt complains of generalized abdominal pain, nausea, NBNB emesis, and several episodes of watery diarrhea starting this morning.  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  Denies fevers but does endorse lightheadedness and sensation that she may fall asleep.  Was recently admitted with C. difficile and sepsis at [facility].  Review of Systems  Positive: Abdominal pain, nausea, vomiting, diarrhea, fatigue Negative: Cp sob   Physical Exam  BP (!) 98/59    Pulse (!) 108    Temp 99.2 F (37.3 C) (Oral)    Resp 16    LMP 03/21/2012    SpO2 99%  Gen:   Awake, no distress   Resp:  Normal effort  MSK:   Moves extremities without difficulty  Other:  Mildly tachycardic with regular rhythm.  Lungs CTA B.  Abdomen soft, nondistended, nontender.  Medical Decision Making  Medically screening exam initiated at 3:45 PM.  Appropriate orders placed.  Diana Herman was informed that the remainder of the evaluation will be completed by another provider, this initial triage assessment does not replace that evaluation, and the importance of remaining in the ED until their evaluation is complete.  This chart was dictated using voice recognition software, Dragon. Despite the best efforts of this provider to proofread and correct errors, errors may still occur which can change documentation meaning.    Emeline Darling, PA-C 11/19/21 1546    Daleen Bo, MD 11/19/21 1945

## 2021-11-19 NOTE — ED Triage Notes (Signed)
Pt BIB EMS from home. Pt c/o diarrhea and vomiting starting this morning. Pt states diarrhea has stopped but pt still nauseous. EMS reports pt was orthostatic.   20G L AC 450 mL NS given by EMS CBG 90

## 2021-11-19 NOTE — ED Provider Notes (Signed)
[facility] DEPT Provider Note   CSN: [id number] Arrival date & time: 11/19/21  1521     History  Chief Complaint  Patient presents with   Diarrhea   Emesis    Diana Herman is a 56 y.o. female.  HPI 56 year old female presents with diarrhea and vomiting.  She has had C. difficile twice, first in September and then again in November.  She is currently in the tapering phase of the oral vancomycin for 6 weeks but has had no new antibiotics.  She is concerned she has recurrent C. difficile though it also feels a little different.  She is had some intermittent abdominal pain that lasts just a couple minutes and is overall pretty benign.  Right now her pain is minimal.  She has vomited about 4 times this morning but none since but still is nauseated.  However she has had about 15 episodes of diarrhea.  Started off a little loose and brown and now is more watery.  No bloody stools.  No fevers but she has been having some chills today.  About 2 or 3 days ago had some congestion and upper respiratory symptoms that are improving.  Home Medications Prior to Admission medications   Medication Sig Start Date End Date Taking? Authorizing Provider  Magnesium Oxide (MAG-OXIDE) 200 MG TABS Take 1 tablet (200 mg total) by mouth daily. 11/19/21  Yes Sherwood Gambler, MD  acetaminophen (TYLENOL) 500 MG tablet Take 1,000 mg by mouth every 6 (six) hours as needed for headache.    [provider]  alendronate (FOSAMAX) 35 MG tablet Take 35 mg by mouth every Sunday. 05/30/20   [provider]  Calcium Carb-Cholecalciferol (CALCIUM-VITAMIN D) 500-400 MG-UNIT TABS Take 1 tablet by mouth See admin instructions. Bid  6 days a week(does not take on Sunday)    [provider]  cholecalciferol (VITAMIN D) 25 MCG (1000 UNIT) tablet Take 1,000 Units by mouth at bedtime.    [provider]  clonazePAM (KLONOPIN) 0.5 MG tablet Take 0.5 mg by mouth 2 (two)  times daily as needed for anxiety. 07/19/08   [provider]  DULoxetine (CYMBALTA) 60 MG capsule Take 120 mg by mouth daily. 05/30/20   [provider]  ferrous gluconate (FERGON) 324 MG tablet Take 324 mg by mouth daily. 09/13/21   [provider]  gabapentin (NEURONTIN) 300 MG capsule Take 300 mg by mouth at bedtime. 01/18/21   [provider]  hydroxychloroquine (PLAQUENIL) 200 MG tablet Take 200 mg by mouth 2 (two) times daily. 08/24/21   [provider]  ibuprofen (ADVIL) 200 MG tablet Take 800 mg by mouth every 6 (six) hours as needed for headache or moderate pain.    [provider]  metFORMIN (GLUCOPHAGE) 500 MG tablet Take 1,000 mg by mouth daily with breakfast. 08/31/20   [provider]  pilocarpine (SALAGEN) 5 MG tablet Take 5 mg by mouth See admin instructions. Monday-friday 12/19/20 12/19/21  [provider]  Polyethyl Glycol-Propyl Glycol (SYSTANE OP) Place 1 drop into both eyes daily.    [provider]  predniSONE (DELTASONE) 10 MG tablet Take 20 mg by mouth daily.  08/08/20   [provider]  rosuvastatin (CRESTOR) 10 MG tablet Take 10 mg by mouth at bedtime. 07/10/21   [provider]  traZODone (DESYREL) 50 MG tablet Take 75 mg by mouth at bedtime. 05/30/20   [provider]  vancomycin (VANCOCIN) 125 MG capsule Take 1 capsule (  125mg ) by mouth 4 times daily until 10/28/21. On 10/29/21 start 1 capsule 2 times daily for 7 days, then 1 cap once daily for 7 days, then 1 cap every other day for 7 days, then 1 cap every 3 days for 7 days. 10/17/21   Ghimire, Henreitta Leber, MD      Allergies    Other    Review of Systems   Review of Systems  Constitutional:  Positive for chills. Negative for fever.  Respiratory:  Negative for shortness of breath.   Gastrointestinal:  Positive for abdominal pain, diarrhea, nausea and vomiting. Negative for blood in stool.  Genitourinary:  Negative for  dysuria.  Neurological:  Positive for weakness.   Physical Exam Updated Vital Signs BP 128/76    Pulse (!) 108    Temp 99.2 F (37.3 C) (Oral)    Resp 20    LMP 03/21/2012    SpO2 100%  Physical Exam Vitals and nursing note reviewed.  Constitutional:      General: She is not in acute distress.    Appearance: She is well-developed. She is obese. She is not ill-appearing or diaphoretic.  HENT:     Head: Normocephalic and atraumatic.  Eyes:     General:        Right eye: No discharge.        Left eye: No discharge.  Cardiovascular:     Rate and Rhythm: Regular rhythm. Tachycardia present.     Heart sounds: Normal heart sounds.  Pulmonary:     Effort: Pulmonary effort is normal.     Breath sounds: Normal breath sounds.  Abdominal:     General: There is no distension.     Palpations: Abdomen is soft.     Tenderness: There is no abdominal tenderness.  Skin:    General: Skin is warm and dry.  Neurological:     Mental Status: She is alert.  Psychiatric:        Mood and Affect: Mood is not anxious.    ED Results / Procedures / Treatments   Labs (all labs ordered are listed, but only abnormal results are displayed) Labs Reviewed  RESP PANEL BY RT-PCR (FLU A&B, COVID) ARPGX2 - Abnormal; Notable for the following components:      Result Value   SARS Coronavirus 2 by RT PCR POSITIVE (*)    All other components within normal limits  COMPREHENSIVE METABOLIC PANEL - Abnormal; Notable for the following components:   Glucose, Bld 179 (*)    Creatinine, Ser 1.12 (*)    GFR, Estimated 59 (*)    All other components within normal limits  CBC WITH DIFFERENTIAL/PLATELET - Abnormal; Notable for the following components:   WBC 13.5 (*)    RDW 15.9 (*)    Neutro Abs 9.9 (*)    All other components within normal limits  LIPASE, BLOOD - Abnormal; Notable for the following components:   Lipase 65 (*)    All other components within normal limits  LACTIC ACID, PLASMA - Abnormal; Notable for  the following components:   Lactic Acid, Venous 3.0 (*)    All other components within normal limits  LACTIC ACID, PLASMA - Abnormal; Notable for the following components:   Lactic Acid, Venous 2.0 (*)    All other components within normal limits  MAGNESIUM - Abnormal; Notable for the following components:   Magnesium 1.5 (*)    All other components within normal limits  C DIFFICILE QUICK SCREEN W PCR REFLEX  GASTROINTESTINAL PANEL BY PCR, STOOL (REPLACES STOOL CULTURE)  URINALYSIS, ROUTINE W REFLEX MICROSCOPIC    EKG EKG Interpretation  Date/Time:  "Sunday November 19 2021 17:17:25 EST Ventricular Rate:  92 PR Interval:  123 QRS Duration: 106 QT Interval:  374 QTC Calculation: 463 R Axis:   88 Text Interpretation: Normal sinus rhythm no acute ST/T changes similar to Sept 2022 Confirmed by Dylann Gallier ([id number]) on 11/19/2021 5:39:16 PM  Radiology No results found.  Procedures Procedures    Medications Ordered in ED Medications  ondansetron (ZOFRAN) injection 4 mg (4 mg Intravenous Given 11/19/21 1642)  lactated ringers bolus 1,000 mL (0 mLs Intravenous Stopped 11/19/21 1956)  magnesium sulfate IVPB 2 g 50 mL (0 g Intravenous Stopped 11/19/21 1945)  lactated ringers bolus 1,000 mL (1,000 mLs Intravenous New Bag/Given 11/19/21 1845)    ED Course/ Medical Decision Making/ A&P                           Medical Decision Making  Overall I think patient's acute illness is coming from COVID-19.  While she does have a history of prior C. difficile, this is unlikely to be recurrent C. difficile and her antigen/toxin testing are negative, indicating no active C. difficile.  I have reviewed and interpreted her labwork. Has mild leukocytosis that's similar to 12/21 (care everywhere).  There is no obvious bacterial infection so I think this elevated WBC is likely reactive to the vomiting.  She has an elevated lactic acid, but again, no indication of bacterial illness and it has improved  with fluids.  I suspect this is from dehydration.  Magnesium is mildly low and she was given IV magnesium and will be discharged with oral magnesium replacement.  Likely from the diarrhea.  I have reviewed her most recent D/C summary, where they presumptively treated for C diff with oral vanc. Given abdominal pain I considered CT but her abdominal exam is so benign I don't think it's needed.   She is now tolerating p.o. fluids and feels much better.  I think she is stable for discharge home.  I did discuss and offer Paxlovid, but she declines.        Final Clinical Impression(s) / ED Diagnoses Final diagnoses:  COVID-19  Dehydration  Hypomagnesemia    Rx / DC Orders ED Discharge Orders          Ordered    Magnesium Oxide (MAG-OXIDE) 200 MG TABS  Daily        01" /01/23 2235              Sherwood Gambler, MD 11/19/21 2244

## 2021-11-19 NOTE — ED Notes (Signed)
Patient offered a beverage for PO challenge.

## 2021-11-19 NOTE — Discharge Instructions (Addendum)
Be sure to drink plenty of fluids.  If you develop high fever, shortness of breath, uncontrolled vomiting, bloody diarrhea, abdominal pain, or any other new/concerning symptoms then return to the ER for evaluation.

## 2021-11-21 LAB — GASTROINTESTINAL PANEL BY PCR, STOOL (REPLACES STOOL CULTURE)

## 2021-12-12 ENCOUNTER — Ambulatory Visit: Payer: BC Managed Care – PPO | Admitting: Internal Medicine

## 2021-12-12 ENCOUNTER — Encounter: Payer: Self-pay | Admitting: Internal Medicine

## 2021-12-12 VITALS — BP 122/74 | HR 94 | Ht 65.0 in | Wt 245.1 lb

## 2021-12-12 DIAGNOSIS — A0472 Enterocolitis due to Clostridium difficile, not specified as recurrent: Secondary | ICD-10-CM

## 2021-12-12 DIAGNOSIS — D120 Benign neoplasm of cecum: Secondary | ICD-10-CM | POA: Diagnosis not present

## 2021-12-12 NOTE — Patient Instructions (Addendum)
You have been scheduled for a colonoscopy. Please follow written instructions given to you at your visit today.  Please pick up your prep supplies at the pharmacy within the next 1-3 days. If you use inhalers (even only as needed), please bring them with you on the day of your procedure. Hold your oral diabetic medicine the AM of the procedure. Hold iron 5 days prior.  If you are age 54 or older, your body mass index should be between 23-30. Your Body mass index is 40.79 kg/m. If this is out of the aforementioned range listed, please consider follow up with your Primary Care Provider.  If you are age 8 or younger, your body mass index should be between 19-25. Your Body mass index is 40.79 kg/m. If this is out of the aformentioned range listed, please consider follow up with your Primary Care Provider.   ________________________________________________________  The [facility] providers would like to encourage you to use [facility] to communicate with providers for non-urgent requests or questions.  Due to long hold times on the telephone, sending your provider a message by [facility] may be a faster and more efficient way to get a response.  Please allow 48 business hours for a response.  Please remember that this is for non-urgent requests.  _________________________________________   If you get diarrhea reach out to [place] please.    I appreciate the opportunity to care for you. Silvano Rusk, MD, [facility]

## 2021-12-12 NOTE — Progress Notes (Signed)
Diana Herman 56 y.o. 08/26/1966 [id number]  Assessment & Plan:   Encounter Diagnoses  Name Primary?   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  We had a discussion about recurrent C. difficile.  Hopefully this will not be an issue but if she starts to have signs or symptoms again she is encouraged to contact us quickly.  She also deteriorated significantly from a volume status, blood pressure status so could need to go back to the emergency room if she has signs of orthostasis as well.  I have recommended she try Florastor if she has to take antibiotics in the future.  CC: Ryter-Brown, Shyrl Numbers, MD  Subjective:   Chief Complaint: Recent C. difficile, large cecal adenoma  HPI 56 year old white woman with PMR, chronic steroid use, diabetes mellitus, GERD, hypertension and sleep apnea known to me from prior colonoscopy showing a large cecal adenomatous polyp, May 2022.  It was removed piecemeal.  In the interim she has had C. difficile 2 or 3 times and just finished a long taper after hospitalization for C. difficile and profound dehydration, late November.  She had had a September hospitalization as well with dehydration and suspected C. difficile at that time.  She takes long-term steroids for PMR and gets hypotensive quite quickly with these episodes.  She had diarrhea again and went to the emergency department on New Year's and had stool testing that showed Campylobacter and also tested positive for COVID.  Stools are much better at this point mostly formed in fact always formed sometimes soft.  Not back to her baseline yet but clearly much better.  She is ready to schedule her close follow-up colonoscopy No Active Allergies Current Meds  Medication Sig   acetaminophen  (TYLENOL) 500 MG tablet Take 1,000 mg by mouth every 6 (six) hours as needed for headache.   alendronate (FOSAMAX) 35 MG tablet Take 35 mg by mouth every Sunday.   cholecalciferol (VITAMIN D) 25 MCG (1000 UNIT) tablet Take 1,000 Units by mouth at bedtime.   clonazePAM (KLONOPIN) 0.5 MG tablet Take 0.5 mg by mouth 2 (two) times daily as needed for anxiety.   DULoxetine (CYMBALTA) 60 MG capsule Take 120 mg by mouth daily.   ferrous gluconate (FERGON) 324 MG tablet Take 324 mg by mouth daily.   gabapentin (NEURONTIN) 300 MG capsule Take 300 mg by mouth at bedtime.   ibuprofen (ADVIL) 200 MG tablet Take 800 mg by mouth every 6 (six) hours as needed for headache or moderate pain.   losartan (COZAAR) 25 MG tablet Take 25 mg by mouth daily.   Magnesium 200 MG TABS Take 225 mg by mouth.   metFORMIN (GLUCOPHAGE) 500 MG tablet Take 1,000 mg by mouth daily with breakfast.   pilocarpine (SALAGEN) 5 MG tablet Take 5 mg by mouth See admin instructions. Monday-friday   Polyethyl Glycol-Propyl Glycol (SYSTANE OP) Place 1 drop into both eyes daily.   predniSONE (DELTASONE) 10 MG tablet Take 20 mg by mouth daily.    rosuvastatin (CRESTOR) 10 MG tablet Take 10 mg by mouth at bedtime.   traZODone (DESYREL) 50 MG tablet Take 75 mg by mouth at bedtime.   Past Medical History:  Diagnosis Date   Allergy    Anemia    Anxiety  Cataract    forming    Depression    DM (diabetes mellitus) ([place])    GERD (gastroesophageal reflux disease)    past    Hx of adenomatous colonic polyps 04/28/2021   Hyperlipidemia    Hypertension    Insomnia    Morbid obesity (HCC)    OA (osteoarthritis)    OSA (obstructive sleep apnea)    on CPAP   Polymyalgia rheumatica ([place])    Sleep apnea    wears cpap    Past Surgical History:  Procedure Laterality Date   ACHILLES TENDON SURGERY Right    COLONOSCOPY  04/14/2021   Silvano Rusk [facility]   hysteroscopy and polypectomy     TONSILLECTOMY     WISDOM TOOTH EXTRACTION     early  12's    Social History   Social History Narrative   Patient is single, no children.     She is an eating disorders dietitian   Never smoker 1-2 caffeinated beverages daily no alcohol no other tobacco and no drug use   family history includes Alcoholism in her father and paternal grandmother; Colon polyps in her mother; Depression in her maternal grandmother and mother; Diabetes in her father and maternal grandfather; Diverticulitis in her paternal grandmother; Heart attack in her maternal grandmother; Heart disease in her maternal grandfather and maternal uncle; Hyperlipidemia in her maternal grandmother and mother; Hypertension in her maternal grandfather, maternal grandmother, and mother; Non-Hodgkin's lymphoma in her maternal grandmother; Pancreatitis in her father; Stroke in her maternal uncle.   Review of Systems As above  Objective:   Physical Exam BP 122/74    Pulse 94    Ht 5\' 5"  (1.651 m)    Wt 245 lb 2 oz (111.2 kg)    LMP 03/21/2012    BMI 40.79 kg/m  Obese ww NAD Lungs cta Cor NL S1S2 no rmg Abd obese soft NT BS+ Alert and oriented x 3

## 2021-12-30 ENCOUNTER — Telehealth: Payer: Self-pay | Admitting: Gastroenterology

## 2021-12-30 DIAGNOSIS — R197 Diarrhea, unspecified: Secondary | ICD-10-CM

## 2021-12-30 NOTE — Telephone Encounter (Signed)
Ms. Shvartsman contacted the call center this evening because of abrupt onset nausea, vomiting and diarrhea.  She has a history of recurrent C. difficile (3 separate occasions).  She states that her current symptoms do not seem the same as her previous C. difficile symptoms, in that her stools are different character and she is having a lot of vomiting.  She has had severe problems with dehydration in the setting of these episodes, with multiple syncopal events, and has also been diagnosed with sepsis.  Currently, she does not feel that poorly.  She is able to walk around without dizziness or lightheadedness.  She is tolerating liquids.  She is afebrile.  She does not have significant abdominal pain.  She does feel tired and a little weak. She denies any recent antibiotic use.  We discussed potential courses of action.  Safest option would be for her to go to the emergency room and be evaluated.  I also offered to start empiric treatment for presumed recurrent C. difficile with vancomycin, and then to check a stool on Monday.  Finally, we discussed waiting till Monday to check a C. difficile, and then treat if positive.  The patient preferred this last option.   She wanted to wait and see if her symptoms progressed before going to the emergency room.  I advised her that if she does start getting dizzy or lightheaded when she stands up, she should contact EMS transport her to the emergency room, and she should not try to drive herself.  She indicated understanding.  For now, I recommended she continue to focus on hydration with sports drinks and a bland diet.  We will place an order for a C. difficile test to be submitted at our lab on Monday.  Again reiterated the importance of contacting EMS should she develop worsening weakness or orthostatic symptoms.

## 2022-01-01 NOTE — Telephone Encounter (Signed)
Left message for pt to call back  °

## 2022-01-02 NOTE — Telephone Encounter (Signed)
Left message for pt to call back  °

## 2022-01-02 NOTE — Telephone Encounter (Signed)
Glad she is better  No new tests at this time

## 2022-01-02 NOTE — Telephone Encounter (Signed)
Followed up on status of pt.  Pt stated that she is feeling much better now than the other day. Pt states that she does not feel that she needs the Cdiff testing as requested due to her current symptoms and feeling much better.  Pt states that she has been experiencing these episodes of N/V and diarrhea that last for a day and then the next day she is completely drained. Pt state that she has had three of these episodes  so far this year; Pt is scheduled for a colonoscopy in March; Pt questioning is there any additional test or labs that need to be done.  Please advise

## 2022-01-02 NOTE — Telephone Encounter (Signed)
Patient returned your call, please advise. 

## 2022-01-02 NOTE — Telephone Encounter (Signed)
Left message to call back  

## 2022-01-03 ENCOUNTER — Other Ambulatory Visit: Payer: Self-pay

## 2022-01-03 DIAGNOSIS — R197 Diarrhea, unspecified: Secondary | ICD-10-CM

## 2022-01-03 DIAGNOSIS — D120 Benign neoplasm of cecum: Secondary | ICD-10-CM

## 2022-01-03 DIAGNOSIS — R112 Nausea with vomiting, unspecified: Secondary | ICD-10-CM

## 2022-01-03 NOTE — Telephone Encounter (Signed)
Pt Made aware of Dr. Carlean Purl recommendations:  Pt is requesting answers as why she  has been experiencing these episodes of N/V and diarrhea that last for a day and then the next day she is completely drained. Pt state that she has had three of these episodes  so far this year Please advise:

## 2022-01-03 NOTE — Telephone Encounter (Addendum)
I called her and reviewed things.  3 episodes, 2 in January and 1 recently.  She gets a sense like she is going to have diarrhea with a discomfort in the abdomen then starts getting a lot of foul-smelling belches and she has onset of nausea vomiting and diarrhea and the diarrhea lasts longer than the nausea and vomiting.  She reminded me that in 2021 she was having some episodic nausea and vomiting.  Diabetes diagnosis is relatively recent and there is no diabetic neuropathy known.  She is on a long steroid taper so I do not think this is adrenal insufficiency issues.  I want her to do the following:  #1 please order a 2 view abdomen x-ray regarding nausea and vomiting and diarrhea.  This is for her to do should she get another attack and if this happens out of our typical hours then she can go to an urgent care or ER.  #2 we need to do an EGD in addition to the colonoscopy that is scheduled.  She understands we may need to move that date since it does not look like I can convert her colonoscopy into a double procedure.  EGD diagnosis is nausea and vomiting.

## 2022-01-03 NOTE — Telephone Encounter (Signed)
Pt made aware of Dr. Carlean Purl recommendations: Order placed for 2 view xray: Pt made aware that she can come by the Lab should she get another attack and have this Xray done: address Provided: Pt made aware  if this happens out of our typical hours then she can go to an urgent care or ER. Pt scheduled for an EGD/Colon on 02/13/2022 at 3:00 with Dr. Carlean Purl. Prep instruction sent to pt Via My Chart: Pt Made aware Ambulatory GI referral placed in Epic.  Pt verbalized understating with all questions answered.

## 2022-01-03 NOTE — Telephone Encounter (Signed)
Left message for pt to call back  °

## 2022-01-17 HISTORY — PX: COLONOSCOPY WITH ESOPHAGOGASTRODUODENOSCOPY (EGD): SHX5779

## 2022-02-07 ENCOUNTER — Encounter: Payer: Self-pay | Admitting: Internal Medicine

## 2022-02-08 ENCOUNTER — Encounter: Payer: BC Managed Care – PPO | Admitting: Internal Medicine

## 2022-02-12 ENCOUNTER — Telehealth: Payer: Self-pay | Admitting: Internal Medicine

## 2022-02-12 NOTE — Telephone Encounter (Signed)
Pt procedure changed to a 3pm, endocolon and patient wanted to know her new prep times. Went over prep times for today and tomorrow. Advised pt to have her last sip of liquid tomorrow at 12pm, and to arrive at 2pm for her procedure. Pt verbalized understanding and had no further concerns at the end of the call.  ?

## 2022-02-12 NOTE — Telephone Encounter (Signed)
Inbound call from patient. Requesting a call back to discuss changes in prep since procedure is not an endo colon and different times.  ?

## 2022-02-13 ENCOUNTER — Other Ambulatory Visit: Payer: Self-pay

## 2022-02-13 ENCOUNTER — Encounter: Payer: Self-pay | Admitting: Internal Medicine

## 2022-02-13 ENCOUNTER — Ambulatory Visit (AMBULATORY_SURGERY_CENTER): Payer: BC Managed Care – PPO | Admitting: Internal Medicine

## 2022-02-13 VITALS — BP 126/82 | HR 72 | Temp 97.8°F | Resp 15 | Ht 65.0 in | Wt 245.0 lb

## 2022-02-13 DIAGNOSIS — K259 Gastric ulcer, unspecified as acute or chronic, without hemorrhage or perforation: Secondary | ICD-10-CM | POA: Diagnosis not present

## 2022-02-13 DIAGNOSIS — D124 Benign neoplasm of descending colon: Secondary | ICD-10-CM

## 2022-02-13 DIAGNOSIS — R112 Nausea with vomiting, unspecified: Secondary | ICD-10-CM

## 2022-02-13 DIAGNOSIS — K2970 Gastritis, unspecified, without bleeding: Secondary | ICD-10-CM

## 2022-02-13 DIAGNOSIS — Z86010 Personal history of colonic polyps: Secondary | ICD-10-CM | POA: Diagnosis not present

## 2022-02-13 DIAGNOSIS — K2900 Acute gastritis without bleeding: Secondary | ICD-10-CM | POA: Diagnosis not present

## 2022-02-13 DIAGNOSIS — D120 Benign neoplasm of cecum: Secondary | ICD-10-CM | POA: Diagnosis not present

## 2022-02-13 DIAGNOSIS — K319 Disease of stomach and duodenum, unspecified: Secondary | ICD-10-CM | POA: Diagnosis not present

## 2022-02-13 MED ORDER — PANTOPRAZOLE SODIUM 40 MG PO TBEC: 40.0000 mg | DELAYED_RELEASE_TABLET | Freq: Every day | ORAL | 1 refills | Status: DC

## 2022-02-13 MED ORDER — SODIUM CHLORIDE 0.9 % IV SOLN: 500.0000 mL | INTRAVENOUS | Status: DC

## 2022-02-13 NOTE — Op Note (Signed)
[facility] ?Patient Name: Diana Herman ?Procedure Date: 02/13/2022 2:25 PM ?MRN: [id number] ?Endoscopist: Gatha Mayer , MD ?Age: 56 ?Referring MD:  ?Date of Birth: 29-Sep-1966 ?Gender: Female ?Account #: [id number] ?Procedure:                Colonoscopy ?Indications:              Surveillance: Piecemeal removal of large sessile  ?                          adenoma last colonoscopy (< 3 yrs) ?Medicines:                Monitored Anesthesia Care ?Procedure:                Pre-Anesthesia Assessment: ?                          - Prior to the procedure, a History and Physical  ?                          was performed, and patient medications and  ?                          allergies were reviewed. The patient's tolerance of  ?                          previous anesthesia was also reviewed. The risks  ?                          and benefits of the procedure and the sedation  ?                          options and risks were discussed with the patient.  ?                          All questions were answered, and informed consent  ?                          was obtained. Prior Anticoagulants: The patient has  ?                          taken no previous anticoagulant or antiplatelet  ?                          agents. ASA Grade Assessment: III - A patient with  ?                          severe systemic disease. After reviewing the risks  ?                          and benefits, the patient was deemed in  ?                          satisfactory condition to undergo the procedure. ?  After obtaining informed consent, the colonoscope  ?                          was passed under direct vision. Throughout the  ?                          procedure, the patient's blood pressure, pulse, and  ?                          oxygen saturations were monitored continuously. The  ?                          CF [id number] #[id number] was introduced through the anus  ?                          and advanced to the  the cecum, identified by  ?                          appendiceal orifice and ileocecal valve. The  ?                          colonoscopy was performed without difficulty. The  ?                          patient tolerated the procedure well. The quality  ?                          of the bowel preparation was good. The ileocecal  ?                          valve, appendiceal orifice, and rectum were  ?                          photographed. The bowel preparation used was  ?                          Miralax via split dose instruction. ?Scope In: 2:42:12 PM ?Scope Out: 3:17:57 PM ?Scope Withdrawal Time: 0 hours 29 minutes 11 seconds  ?Total Procedure Duration: 0 hours 35 minutes 45 seconds  ?Findings:                 The perianal and digital rectal examinations were  ?                          normal. ?                          A 12 mm polyp was found in the cecum. residual from  ?                          removal of 25 mm polyp. The polyp was sessile and  ?                          there was a retained clip that could not  be removed  ?                          despite attempts. The polyp was removed with a  ?                          piecemeal technique using a hot snare, cold snare  ?                          and cold biosy. Resection and retrieval were  ?                          complete - all visible polyp removed. Soft coag tip  ?                          cautery also applied Verification of patient  ?                          identification for the specimen was done. Estimated  ?                          blood loss was minimal. ?                          A diminutive polyp was found in the descending  ?                          colon. The polyp was sessile. The polyp was removed  ?                          with a cold snare. Resection and retrieval were  ?                          complete. Verification of patient identification  ?                          for the specimen was done. Estimated blood loss was  ?                           minimal. ?                          The exam was otherwise without abnormality on  ?                          direct and retroflexion views. ?Complications:            No immediate complications. ?Estimated Blood Loss:     Estimated blood loss was minimal. ?Impression:               - One 12 mm polyp in the cecum, removed piecemeal  ?                          using a hot and cold snare, cold biopsy. Soft tip  ?  coag also used to ablate. residual clip not  ?                          removed. Resected and retrieved. ?                          - One diminutive polyp in the descending colon,  ?                          removed with a cold snare. Resected and retrieved. ?                          - The examination was otherwise normal on direct  ?                          and retroflexion views. ?Recommendation:           - Patient has a contact number available for  ?                          emergencies. The signs and symptoms of potential  ?                          delayed complications were discussed with the  ?                          patient. Return to normal activities tomorrow.  ?                          Written discharge instructions were provided to the  ?                          patient. ?                          - Resume previous diet. ?                          - Continue present medications. ?                          - No aspirin, ibuprofen, naproxen, or other  ?                          non-steroidal anti-inflammatory drugs for 2 weeks  ?                          after polyp removal. ?                          - Await pathology results. ?                          - Repeat colonoscopy is recommended. The  ?                          colonoscopy date  will be determined after pathology  ?                          results from today's exam become available for  ?                          review. ?Gatha Mayer, MD ?02/13/2022 3:37:32 PM ?This report has  been signed electronically. ?

## 2022-02-13 NOTE — Progress Notes (Signed)
Pt's states no medical or surgical changes since previsit or office visit. 

## 2022-02-13 NOTE — Progress Notes (Deleted)
? ?Diana Herman 56 y.o. 1966-03-31 [id number] ? ?Assessment & Plan:  ? ? ? ? ?Subjective:  ? ?Chief Complaint: ? ?HPI ? ?No Active Allergies ?Current Meds  ?Medication Sig  ? acetaminophen (TYLENOL) 500 MG tablet Take 1,000 mg by mouth every 6 (six) hours as needed for headache.  ? alendronate (FOSAMAX) 35 MG tablet Take 35 mg by mouth every Sunday.  ? cholecalciferol (VITAMIN D) 25 MCG (1000 UNIT) tablet Take 1,000 Units by mouth at bedtime.  ? clonazePAM (KLONOPIN) 0.5 MG tablet Take 0.5 mg by mouth 2 (two) times daily as needed for anxiety.  ? clonazePAM (KLONOPIN) 0.5 MG tablet Take by mouth.  ? DULoxetine (CYMBALTA) 60 MG capsule Take 120 mg by mouth daily.  ? ferrous gluconate (FERGON) 324 MG tablet Take 324 mg by mouth daily.  ? gabapentin (NEURONTIN) 300 MG capsule Take 300 mg by mouth at bedtime.  ? ibuprofen (ADVIL) 200 MG tablet Take 800 mg by mouth every 6 (six) hours as needed for headache or moderate pain.  ? losartan (COZAAR) 25 MG tablet Take 25 mg by mouth daily.  ? MAGNESIUM-OXIDE 400 (240 Mg) MG tablet Take 1 tablet by mouth daily.  ? metFORMIN (GLUCOPHAGE) 500 MG tablet Take 1,000 mg by mouth daily with breakfast.  ? metFORMIN (GLUCOPHAGE) 500 MG tablet Take 1 tablet by mouth 2 (two) times daily.  ? predniSONE (DELTASONE) 10 MG tablet Take 20 mg by mouth daily.   ? rosuvastatin (CRESTOR) 10 MG tablet Take 10 mg by mouth at bedtime.  ? traZODone (DESYREL) 50 MG tablet Take 75 mg by mouth at bedtime.  ? [DISCONTINUED] DULoxetine (CYMBALTA) 60 MG capsule Take 1 capsule by mouth 2 (two) times daily.  ? [DISCONTINUED] gabapentin (NEURONTIN) 300 MG capsule Take 1 capsule by mouth at bedtime.  ? [DISCONTINUED] losartan (COZAAR) 25 MG tablet Take 1 tablet by mouth daily.  ? ?Current Facility-Administered Medications for the 02/13/22 encounter (Procedure visit) with Gatha Mayer, MD  ?Medication  ? 0.9 %  sodium chloride infusion  ? ?Past Medical History:  ?Diagnosis Date  ? Allergy   ? Anemia    ? Anxiety   ? Cataract   ? forming   ? COVID-19 11/2021  ? Depression   ? DM (diabetes mellitus) ([place])   ? GERD (gastroesophageal reflux disease)   ? past   ? Hx of adenomatous colonic polyps 04/28/2021  ? Hyperlipidemia   ? Hypertension   ? Insomnia   ? Morbid obesity ([place])   ? OA (osteoarthritis)   ? OSA (obstructive sleep apnea)   ? on CPAP  ? Polymyalgia rheumatica ([place])   ? 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 Sleep apnea   ? wears cpap   ? ?Past Surgical History:  ?Procedure Laterality Date  ? ACHILLES TENDON SURGERY Right   ? COLONOSCOPY  04/14/2021  ? [facility]  ? hysteroscopy and polypectomy    ? TONSILLECTOMY    ? WISDOM TOOTH EXTRACTION    ? early 40's   ? ?Social History  ? ?Social History Narrative  ? Patient is single, no children.   ?  She is an eating disorders dietitian  ? Never smoker 1-2 caffeinated beverages daily no alcohol no other tobacco and no drug use  ? ?family history includes Alcoholism in her father and paternal grandmother; Colon polyps in her mother; Depression in her maternal grandmother and mother; Diabetes in her father and maternal grandfather; Diverticulitis in her paternal  grandmother; Heart attack in her maternal grandmother; Heart disease in her maternal grandfather and maternal uncle; Hyperlipidemia in her maternal grandmother and mother; Hypertension in her maternal grandfather, maternal grandmother, and mother; Non-Hodgkin's lymphoma in her maternal grandmother; Pancreatitis in her father; Stroke in her maternal uncle. ? ? ?Review of Systems ? ? ?Objective:  ? Physical Exam ? ? ?

## 2022-02-13 NOTE — Progress Notes (Signed)
Called to room to assist during endoscopic procedure.  Patient ID and intended procedure confirmed with present staff. Received instructions for my participation in the procedure from the performing physician.  

## 2022-02-13 NOTE — Patient Instructions (Addendum)
There were ulcers and inflammation in the stomach and esophagus. I took biopsies. ?Fosamax, ibuprofen and prednisone are potential causes - I am starting omeprazole to treat. ? ?There was residual polyp seen in the colon - removed as best I could, and one other tiny polyp removed elsewhere. ? ? ?Once I see pathology results will contact you with recommendations. ? ?All looks benign, still. ? ? ? ? ?YOU HAD AN ENDOSCOPIC PROCEDURE TODAY: Refer to the procedure report and other information in the discharge instructions given to you for any specific questions about what was found during the examination. If this information does not answer your questions, please call [place] office at [phone] to clarify.  ? ?YOU SHOULD EXPECT: Some feelings of bloating in the abdomen. Passage of more gas than usual. Walking can help get rid of the air that was put into your GI tract during the procedure and reduce the bloating. If you had a lower endoscopy (such as a colonoscopy or flexible sigmoidoscopy) you may notice spotting of blood in your stool or on the toilet paper. Some abdominal soreness may be present for a day or two, also. ? ?DIET: Your first meal following the procedure should be a light meal and then it is ok to progress to your normal diet. A half-sandwich or bowl of soup is an example of a good first meal. Heavy or fried foods are harder to digest and may make you feel nauseous or bloated. Drink plenty of fluids but you should avoid alcoholic beverages for 24 hours. If you had a esophageal dilation, please see attached instructions for diet.   ? ?ACTIVITY: Your care partner should take you home directly after the procedure. You should plan to take it easy, moving slowly for the rest of the day. You can resume normal activity the day after the procedure however YOU SHOULD NOT DRIVE, use power tools, machinery or perform tasks that involve climbing or major physical exertion for 24 hours (because of the sedation  medicines used during the test).  ? ?SYMPTOMS TO REPORT IMMEDIATELY: ?A gastroenterologist can be reached at any hour. Please call [phone]  for any of the following symptoms:  ?Following lower endoscopy (colonoscopy, flexible sigmoidoscopy) ?Excessive amounts of blood in the stool  ?Significant tenderness, worsening of abdominal pains  ?Swelling of the abdomen that is new, acute  ?Fever of 100? or higher  ?Following upper endoscopy (EGD, EUS, ERCP, esophageal dilation) ?Vomiting of blood or coffee ground material  ?New, significant abdominal pain  ?New, significant chest pain or pain under the shoulder blades  ?Painful or persistently difficult swallowing  ?New shortness of breath  ?Black, tarry-looking or red, bloody stools ? ?FOLLOW UP:  ?If any biopsies were taken you will be contacted by phone or by letter within the next 1-3 weeks. Call [phone]  if you have not heard about the biopsies in 3 weeks.  ?Please also call with any specific questions about appointments or follow up tests.  ?

## 2022-02-13 NOTE — Progress Notes (Signed)
Pt awake, report to RN, [facility]  °

## 2022-02-13 NOTE — Op Note (Signed)
[facility] ?Patient Name: Diana Herman ?Procedure Date: 02/13/2022 2:25 PM ?MRN: [id number] ?Endoscopist: Gatha Mayer , MD ?Age: 56 ?Referring MD:  ?Date of Birth: [birth year] ?Gender: Female ?Account #: [id number] ?Procedure:                Upper GI endoscopy ?Indications:              Nausea with vomiting ?Medicines:                Monitored Anesthesia Care ?Procedure:                Pre-Anesthesia Assessment: ?                          - Prior to the procedure, a History and Physical  ?                          was performed, and patient medications and  ?                          allergies were reviewed. The patient's tolerance of  ?                          previous anesthesia was also reviewed. The risks  ?                          and benefits of the procedure and the sedation  ?                          options and risks were discussed with the patient.  ?                          All questions were answered, and informed consent  ?                          was obtained. Prior Anticoagulants: The patient has  ?                          taken no previous anticoagulant or antiplatelet  ?                          agents. ASA Grade Assessment: III - A patient with  ?                          severe systemic disease. After reviewing the risks  ?                          and benefits, the patient was deemed in  ?                          satisfactory condition to undergo the procedure. ?                          After obtaining informed consent, the endoscope was  ?  passed under direct vision. Throughout the  ?                          procedure, the patient's blood pressure, pulse, and  ?                          oxygen saturations were monitored continuously. The  ?                          Endoscope was introduced through the mouth, and  ?                          advanced to the second part of duodenum. The upper  ?                          GI endoscopy was accomplished  without difficulty.  ?                          The patient tolerated the procedure well. ?Scope In: ?Scope Out: ?Findings:                 LA Grade A (one or more mucosal breaks less than 5  ?                          mm, not extending between tops of 2 mucosal folds)  ?                          esophagitis was found at the gastroesophageal  ?                          junction. Biopsies were taken with a cold forceps  ?                          for histology. Verification of patient  ?                          identification for the specimen was done. Estimated  ?                          blood loss was minimal. ?                          Few non-bleeding superficial gastric ulcers with no  ?                          stigmata of bleeding were found in the prepyloric  ?                          region of the stomach. The largest lesion was 6 mm  ?                          in largest dimension. Biopsies were taken with a  ?  cold forceps for histology. Verification of patient  ?                          identification for the specimen was done. Estimated  ?                          blood loss was minimal. ?                          Diffuse severe inflammation characterized by  ?                          erosions, erythema, friability and granularity was  ?                          found in the prepyloric region of the stomach.  ?                          Biopsies were taken with a cold forceps for  ?                          histology. Verification of patient identification  ?                          for the specimen was done. Estimated blood loss was  ?                          minimal. ?                          The exam was otherwise without abnormality. ?                          Biopsies were taken with a cold forceps in the  ?                          gastric body for histology. Biopsies were taken  ?                          with a cold forceps for histology. Verification of  ?                           patient identification for the specimen was done.  ?                          Estimated blood loss was minimal. ?                          The cardia and gastric fundus were normal on  ?                          retroflexion. ?Complications:            No immediate complications. ?Estimated Blood Loss:     Estimated blood loss was minimal. ?Impression:               -  LA Grade A reflux esophagitis. Biopsied. ?                          - Non-bleeding gastric ulcers with no stigmata of  ?                          bleeding. Biopsied. ?                          - Gastritis. Biopsied. ?                          - The examination was otherwise normal. ?                          - Biopsies were taken with a cold forceps for  ?                          histology in the gastric body. ?                          - Biopsies were taken with a cold forceps for  ?                          histology. ?Recommendation:           - Patient has a contact number available for  ?                          emergencies. The signs and symptoms of potential  ?                          delayed complications were discussed with the  ?                          patient. Return to normal activities tomorrow.  ?                          Written discharge instructions were provided to the  ?                          patient. ?                          - Resume previous diet. ?                          - Continue present medications. ?                          - Await pathology results. ?                          - See the other procedure note for documentation of  ?                          additional recommendations. ?                          -  Possible causes of gastritis are ibuprofen,  ?                          prednisone, Fosamax ?Gatha Mayer, MD ?02/13/2022 3:31:02 PM ?This report has been signed electronically. ?

## 2022-02-13 NOTE — Progress Notes (Signed)
[facility] History and Physical ? ? ?Primary Care Physician:  Wayland Salinas, MD ? ? ?Reason for Procedure:   Nausea and vomiting, polyp of colon and iron deficiency anemia ? ?Plan:   EGD, colonoscopy  ? ? ? ? ?HPI: Diana Herman is a 56 y.o. female here for f/u cecal adenoma removal, nausea and vomiting and iron def anemia ? ? ?Past Medical History:  ?Diagnosis Date  ? Allergy   ? Anemia   ? Anxiety   ? Cataract   ? forming   ? COVID-19 11/2021  ? Depression   ? DM (diabetes mellitus) ([place])   ? GERD (gastroesophageal reflux disease)   ? past   ? Hx of adenomatous colonic polyps 04/28/2021  ? Hyperlipidemia   ? Hypertension   ? Insomnia   ? Morbid obesity ([place])   ? OA (osteoarthritis)   ? OSA (obstructive sleep apnea)   ? on CPAP  ? Polymyalgia rheumatica ([place])   ? 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 Sleep apnea   ? wears cpap   ? ? ?Past Surgical History:  ?Procedure Laterality Date  ? ACHILLES TENDON SURGERY Right   ? COLONOSCOPY  04/14/2021  ? [facility]  ? hysteroscopy and polypectomy    ? TONSILLECTOMY    ? WISDOM TOOTH EXTRACTION    ? early 35's   ? ? ?Prior to Admission medications   ?Medication Sig Start Date End Date Taking? Authorizing Provider  ?acetaminophen (TYLENOL) 500 MG tablet Take 1,000 mg by mouth every 6 (six) hours as needed for headache.   Yes [provider]  ?alendronate (FOSAMAX) 35 MG tablet Take 35 mg by mouth every Sunday. 05/30/20  Yes [provider]  ?cholecalciferol (VITAMIN D) 25 MCG (1000 UNIT) tablet Take 1,000 Units by mouth at bedtime.   Yes [provider]  ?clonazePAM (KLONOPIN) 0.5 MG tablet Take 0.5 mg by mouth 2 (two) times daily as needed for anxiety. 07/19/08  Yes [provider]  ?clonazePAM (KLONOPIN) 0.5 MG tablet Take by mouth. 12/25/21  Yes [provider]  ?DULoxetine (CYMBALTA) 60 MG capsule Take 120 mg by mouth daily. 05/30/20  Yes [provider]  ?ferrous  gluconate (FERGON) 324 MG tablet Take 324 mg by mouth daily. 09/13/21  Yes [provider]  ?gabapentin (NEURONTIN) 300 MG capsule Take 300 mg by mouth at bedtime. 01/18/21  Yes [provider]  ?ibuprofen (ADVIL) 200 MG tablet Take 800 mg by mouth every 6 (six) hours as needed for headache or moderate pain.   Yes [provider]  ?losartan (COZAAR) 25 MG tablet Take 25 mg by mouth daily. 10/27/21  Yes [provider]  ?MAGNESIUM-OXIDE 400 (240 Mg) MG tablet Take 1 tablet by mouth daily. 11/20/21  Yes [provider]  ?metFORMIN (GLUCOPHAGE) 500 MG tablet Take 1,000 mg by mouth daily with breakfast. 08/31/20  Yes [provider]  ?metFORMIN (GLUCOPHAGE) 500 MG tablet Take 1 tablet by mouth 2 (two) times daily. 11/23/21  Yes [provider]  ?predniSONE (DELTASONE) 10 MG tablet Take 20 mg by mouth daily.  08/08/20  Yes [provider]  ?rosuvastatin (CRESTOR) 10 MG tablet Take 10 mg by mouth at bedtime. 07/10/21  Yes [provider]  ?traZODone (DESYREL) 50 MG tablet Take 75 mg by mouth at bedtime. 05/30/20  Yes [provider]  ?ondansetron (ZOFRAN) 4 MG tablet Take by mouth daily. 10/26/21   [provider]  ?Polyethyl Glycol-Propyl  Glycol (SYSTANE OP) Place 1 drop into both eyes daily.    [provider]  ? ? ?Current Outpatient Medications  ?Medication Sig Dispense Refill  ? acetaminophen (TYLENOL) 500 MG tablet Take 1,000 mg by mouth every 6 (six) hours as needed for headache.    ? alendronate (FOSAMAX) 35 MG tablet Take 35 mg by mouth every Sunday.    ? cholecalciferol (VITAMIN D) 25 MCG (1000 UNIT) tablet Take 1,000 Units by mouth at bedtime.    ? clonazePAM (KLONOPIN) 0.5 MG tablet Take 0.5 mg by mouth 2 (two) times daily as needed for anxiety.    ? clonazePAM (KLONOPIN) 0.5 MG tablet Take by mouth.    ? DULoxetine (CYMBALTA) 60 MG capsule Take 120 mg by mouth daily.    ? ferrous gluconate (FERGON) 324 MG tablet  Take 324 mg by mouth daily.    ? gabapentin (NEURONTIN) 300 MG capsule Take 300 mg by mouth at bedtime.    ? ibuprofen (ADVIL) 200 MG tablet Take 800 mg by mouth every 6 (six) hours as needed for headache or moderate pain.    ? losartan (COZAAR) 25 MG tablet Take 25 mg by mouth daily.    ? MAGNESIUM-OXIDE 400 (240 Mg) MG tablet Take 1 tablet by mouth daily.    ? metFORMIN (GLUCOPHAGE) 500 MG tablet Take 1,000 mg by mouth daily with breakfast.    ? metFORMIN (GLUCOPHAGE) 500 MG tablet Take 1 tablet by mouth 2 (two) times daily.    ? predniSONE (DELTASONE) 10 MG tablet Take 20 mg by mouth daily.     ? rosuvastatin (CRESTOR) 10 MG tablet Take 10 mg by mouth at bedtime.    ? traZODone (DESYREL) 50 MG tablet Take 75 mg by mouth at bedtime.    ? ondansetron (ZOFRAN) 4 MG tablet Take by mouth daily.    ? Polyethyl Glycol-Propyl Glycol (SYSTANE OP) Place 1 drop into both eyes daily.    ? ?Current Facility-Administered Medications  ?Medication Dose Route Frequency Provider Last Rate Last Admin  ? 0.9 %  sodium chloride infusion  500 mL Intravenous Continuous Gatha Mayer, MD      ? ? ?Allergies as of 02/13/2022  ? (No Active Allergies)  ? ? ?Family History  ?Problem Relation Age of Onset  ? Colon polyps Mother   ? Depression Mother   ? Hypertension Mother   ? Hyperlipidemia Mother   ? Diabetes Father   ? Alcoholism Father   ? Pancreatitis Father   ? Non-Hodgkin's lymphoma Maternal Grandmother   ? Heart attack Maternal Grandmother   ? Depression Maternal Grandmother   ? Hypertension Maternal Grandmother   ? Hyperlipidemia Maternal Grandmother   ? Diabetes Maternal Grandfather   ? Heart disease Maternal Grandfather   ? Hypertension Maternal Grandfather   ? Alcoholism Paternal Grandmother   ? Diverticulitis Paternal Grandmother   ? Heart disease Maternal Uncle   ? Stroke Maternal Uncle   ? Colon cancer Neg Hx   ? Esophageal cancer Neg Hx   ? Rectal cancer Neg Hx   ? Stomach cancer Neg Hx   ? ? ?Social History   ? ?Socioeconomic History  ? Marital status: Single  ?  Spouse name: Not on file  ? Number of children: 0  ? Years of education: Not on file  ? Highest education level: Not on file  ?Occupational History  ? Occupation: Eating Disorder Dietitian  ?Tobacco Use  ? Smoking status: Never  ? Smokeless tobacco: Never  ?  Vaping Use  ? Vaping Use: Never used  ?Substance and Sexual Activity  ? Alcohol use: Yes  ?  Comment: once a year  ? Drug use: Never  ? Sexual activity: Not Currently  ?Other Topics Concern  ? Not on file  ?Social History Narrative  ? Patient is single, no children.   ?  She is an eating disorders dietitian  ? Never smoker 1-2 caffeinated beverages daily no alcohol no other tobacco and no drug use  ? ?Social Determinants of Health  ? ?Financial Resource Strain: Not on file  ?Food Insecurity: Not on file  ?Transportation Needs: Not on file  ?Physical Activity: Not on file  ?Stress: Not on file  ?Social Connections: Not on file  ?Intimate Partner Violence: Not on file  ? ? ?Review of Systems: ? ?All other review of systems negative except as mentioned in the HPI. ? ?Physical Exam: ?Vital signs ?BP (!) 146/76   Pulse 99   Temp 97.8 ?F (36.6 ?C) (Temporal)   Ht '5\' 5"'$  (1.651 m)   Wt 245 lb (111.1 kg)   LMP 03/21/2012   SpO2 98%   BMI 40.77 kg/m?  ? ?General:   Alert,  Well-developed, well-nourished, pleasant and cooperative in NAD ?Lungs:  Clear throughout to auscultation.   ?Heart:  Regular rate and rhythm; no murmurs, clicks, rubs,  or gallops. ?Abdomen:  Soft, nontender and nondistended. Normal bowel sounds.   ?Neuro/Psych:  Alert and cooperative. Normal mood and affect. A and O x 3 ? ? ?'@Etai Copado'$  Simonne Maffucci, MD, [facility] ?[facility] ?[phone] (pager) ?02/13/2022 2:31 PM@ ? ?

## 2022-02-14 ENCOUNTER — Telehealth: Payer: Self-pay

## 2022-02-14 NOTE — Telephone Encounter (Signed)
I have started a prior authorization for her pantoprazole '40mg'$  tablets, dx: K21.9 GERD thru [facility].  ?

## 2022-02-15 ENCOUNTER — Telehealth: Payer: Self-pay | Admitting: *Deleted

## 2022-02-15 NOTE — Telephone Encounter (Signed)
?  Follow up Call- ? ? ?  02/13/2022  ?  2:13 PM 04/14/2021  ? 10:31 AM  ?Call back number  ?Post procedure Call Back phone  # [phone]  ?Permission to leave phone message Yes Yes  ?  ? ?Patient questions: ? ?Do you have a fever, pain , or abdominal swelling? No. ?Pain Score  0 * ? ?Have you tolerated food without any problems? Yes.   ? ?Have you been able to return to your normal activities? Yes.   ? ?Do you have any questions about your discharge instructions: ?Diet   No. ?Medications  No. ?Follow up visit  No. ? ?Do you have questions or concerns about your Care? No. ? ?Actions: ?* If pain score is 4 or above: ?No action needed, pain <4. ? ? ?

## 2022-02-15 NOTE — Telephone Encounter (Signed)
The pantoprazole has been approved.[facility] notified. ?

## 2022-02-22 ENCOUNTER — Telehealth: Payer: Self-pay | Admitting: Internal Medicine

## 2022-02-22 NOTE — Telephone Encounter (Signed)
Pt states that she has Bouts of N/V and diarrhea every 2 weeks: Pt states that she actually went a month this time before these symptoms occurred again: Pt stated that she started feeling nauseated yesterday; No Vomiting and  pt started having Liquid Diarrhea this AM. Pt stated that she checked her Vitals today and her first check was 129/77  HR 145 FSBS 313: ?Pt rechecked HR  later and it was 118: Pt stated that her FSBS was elevated was due to drinking two Gatorades prior: ?Please advise:   ?

## 2022-02-22 NOTE — Telephone Encounter (Signed)
She is supposed to do a 2 view abdomen when this occurs - we have one ordered for her to do -  ?

## 2022-02-22 NOTE — Telephone Encounter (Signed)
Pt made aware of Dr. Carlean Purl recommendations: Pt stated that she understood this although pt stated that she can not leave the house due to the frequent Diarrhea: Pt was notified that she could take imodium for the Diarrhea and then come and have the xray done: Pt stated that she is to weak:  ?

## 2022-02-22 NOTE — Telephone Encounter (Signed)
Noted and I understand. I called her and explained that if she fails to resolve or worsens she will need to go to the ED for eval and treatment. Advised to push fluids. She is to update me next week re: how things play out and we will consider additional evaluation then ? ? ? ? ? ?

## 2022-02-23 ENCOUNTER — Encounter: Payer: Self-pay | Admitting: Internal Medicine

## 2022-02-23 ENCOUNTER — Other Ambulatory Visit: Payer: Self-pay | Admitting: Internal Medicine

## 2022-02-23 DIAGNOSIS — Z86010 Personal history of colonic polyps: Secondary | ICD-10-CM

## 2022-02-24 ENCOUNTER — Encounter: Payer: Self-pay | Admitting: Internal Medicine

## 2022-02-26 ENCOUNTER — Telehealth: Payer: Self-pay | Admitting: Internal Medicine

## 2022-02-26 DIAGNOSIS — D500 Iron deficiency anemia secondary to blood loss (chronic): Secondary | ICD-10-CM

## 2022-02-26 NOTE — Telephone Encounter (Signed)
I called her.  Her diarrhea has resolved.  She has had these episodic spells of nausea usually with vomiting (not this time) and diarrhea.  She also has iron deficiency anemia.  The cause of that is not entirely clear at this point. ? ?She is tapering prednisone, she is on Fosamax for that she had mild gastritis and I started PPI.  I do not think we need to stop her Fosamax.  She does have a history of C. difficile so we will not plan on PPI forever unless there is some other indication. ? ?The next step in her evaluation is to perform a capsule endoscopy of the small bowel and I told her we would contact her to set that up. ? ?The diagnosis for this is iron deficiency anemia due to chronic blood loss. ? ?I am looking for possible inflammatory bowel disease or other causes of diarrhea, iron deficiency anemia, vomiting. ? ?We reviewed the very rare risk of capsule retention.  She had a CT in late November 2022 without any signs of small bowel disease so I think that is very unlikely. ? ?Please have her hold her iron pills 5 days prior to the capsule endoscopy. ?

## 2022-02-27 ENCOUNTER — Other Ambulatory Visit: Payer: Self-pay

## 2022-02-27 DIAGNOSIS — D500 Iron deficiency anemia secondary to blood loss (chronic): Secondary | ICD-10-CM

## 2022-02-27 NOTE — Telephone Encounter (Signed)
Pt was made aware of Dr. Carlean Purl recommendations: ?Pt was scheduled for an Capsule Endoscopy on 03/08/2022 at 8:30: Pt made aware ?Prep instructions sent to pt via my chart: pt made aware ?Ambulatory Gi referral placed in Epic ?Pt verbalized understanding with all questions answered.  ?Pt notified to hold her iron pills 5 days prior to the capsule endoscopy. ?

## 2022-03-02 ENCOUNTER — Telehealth: Payer: Self-pay | Admitting: Internal Medicine

## 2022-03-02 NOTE — Telephone Encounter (Signed)
Pt stated that she began having abdominal pain this morning  followed by N/V and diarrhea: Pt stated that Dr. Carlean Purl wanted her to call and update him next time this happened: Pt was reminded of previous recommendation of Dr. Carlean Purl to have an xray done with this happens: Pt stated that she informed Dr. Carlean Purl that once these symptoms start to happen that she cannot leave her home: Pt stated that she just want to update Dr. Carlean Purl as he requested: ?Please advise:  ?

## 2022-03-02 NOTE — Telephone Encounter (Signed)
Noted ?Will see what capsule endoscopy shows ?That is scheduled or will be ?

## 2022-03-02 NOTE — Telephone Encounter (Signed)
Patient called to update [place]. Per patient, having nausea, vomiting, and diarrhea again. Patient states that Dr. Carlean Purl told her that if those GI symptoms returned to give him a call. Please advise.  ?

## 2022-03-05 NOTE — Telephone Encounter (Signed)
Pt was made aware of Dr. Carlean Purl recommendations: Pt was reminded of Date of Capsule  Endoscopy: ?Pt stated that her symptoms resolved on Friday evening: ?Pt verbalized understanding with all questions answered.  ? ?

## 2022-03-08 ENCOUNTER — Ambulatory Visit (INDEPENDENT_AMBULATORY_CARE_PROVIDER_SITE_OTHER): Payer: BC Managed Care – PPO | Admitting: Internal Medicine

## 2022-03-08 ENCOUNTER — Encounter: Payer: Self-pay | Admitting: Internal Medicine

## 2022-03-08 DIAGNOSIS — K633 Ulcer of intestine: Secondary | ICD-10-CM | POA: Diagnosis not present

## 2022-03-08 DIAGNOSIS — D500 Iron deficiency anemia secondary to blood loss (chronic): Secondary | ICD-10-CM | POA: Diagnosis not present

## 2022-03-08 NOTE — Progress Notes (Signed)
CAPSULE ID: [id number] ?Exp: 2023-08-01 ?LOT: [id number] ? ?Patient arrived for capsule endoscopy. Reported the prep went well. Confirmed patient is fasting. Explained dietary restrictions for the next few hours. Patient verbalized understanding. Opened capsule, ensured capsule was flashing and transmitting to the recorder prior to the patient swallowing the capsule. Patient swallowed capsule without difficulty. Patient told to call the office with any questions. Understands to return to the office today between 4:00 and 4:30 pm. No further questions by the conclusion of the visit.  ?

## 2022-03-08 NOTE — Patient Instructions (Signed)
?  The capsule endoscopy procedure will last approximately 8 hours. Contact your doctor?s ?office immediately if you suffer from any abdominal pain, nausea or vomiting during the ?procedure. ?1. You may drink colorless liquids starting 2 hours after ?swallowing the capsule at 10:30 am. ? ?2. You may have a light snack  4 hours after ingestion at 12:30 pm. After the ?examination is completed you may return to your normal diet. ? ?3. Check the blue flashing DataRecorder light a couple of times through the day. If is stops blinking or changes color, note the time and contact your ?doctor. ? ?4. Avoid strong electromagnetic fields such as MRI devices or ham radios after swallowing ?the capsule and until you pass it in a bowel movement. ? ?5. Do not disconnect the equipment or completely remove the DataRecorder at any time ?during the procedure. ? ?6. Treat the DataRecorder carefully. Avoid sudden movements and banging the ?DataRecorder. ? ?7. Avoid direct exposure to bright sunlight. ? ?8. Return to the office at 4:30 pm to have the recording equipment removed. ? ?Clear Liquid Diet Examples: ?Black Coffee (non-dairy creamer ok) Jell-O (NO fruit added & NO red Jell-o) ?Water Bouillon (Chicken or Beef) ?7-up Cranberry Juice ?Apple Juice Popsicles (NO red) ?Tea Coke ?Sprite Pepsi ?Ginger Ale Gatorade ?Mt Dew Dr Malachi Bonds ?Light Snack Examples: ?Soup Cereal ?1/2 Sandwich Salad ?Eggs Potatoes ?Toast Rice  ?

## 2022-03-15 ENCOUNTER — Encounter: Payer: Self-pay | Admitting: Internal Medicine

## 2022-03-15 ENCOUNTER — Telehealth: Payer: Self-pay | Admitting: Internal Medicine

## 2022-03-15 DIAGNOSIS — R197 Diarrhea, unspecified: Secondary | ICD-10-CM

## 2022-03-15 DIAGNOSIS — K633 Ulcer of intestine: Secondary | ICD-10-CM

## 2022-03-15 NOTE — Telephone Encounter (Signed)
Please contact patient and tell her that the capsule endoscopy demonstrated some ulcers in the small intestine.  Not many and very small.  These do not give a definitive diagnosis but it does raise the question of possible Crohn's disease. ? ?Please let her know I would like her to do a blood test that will help [place] understand if she does have Crohn's disease and to schedule a follow-up visit in the office. ? ?I would like her to have the office visit 2 to 3 weeks after the blood test is done at a minimum.  It takes a while for that to return. ? ? ? ?

## 2022-03-16 NOTE — Telephone Encounter (Signed)
Left message for pt to call back  °

## 2022-03-19 NOTE — Telephone Encounter (Signed)
Pt was made aware of the recent results and Dr. Carlean Purl recommendations: Pt made aware to come to the lab to have lab drawn at her earliest convenience: Pt scheduled for a follow up appt on 04/11/2022 at 9:30 AM: Pt made aware ?Pt verbalized understanding with all questions answered.  ?  ?

## 2022-03-23 ENCOUNTER — Other Ambulatory Visit: Payer: BC Managed Care – PPO

## 2022-03-23 DIAGNOSIS — K633 Ulcer of intestine: Secondary | ICD-10-CM

## 2022-03-23 DIAGNOSIS — R197 Diarrhea, unspecified: Secondary | ICD-10-CM

## 2022-03-26 LAB — IBD EXPANDED PANEL
ACCA: 15 units (ref 0–90)
ALCA: 14 units (ref 0–60)
AMCA: 17 units (ref 0–100)
Atypical pANCA: NEGATIVE
gASCA: 7 units (ref 0–50)

## 2022-04-11 ENCOUNTER — Ambulatory Visit: Payer: BC Managed Care – PPO | Admitting: Internal Medicine

## 2022-04-11 ENCOUNTER — Encounter: Payer: Self-pay | Admitting: Internal Medicine

## 2022-04-11 VITALS — BP 132/70 | HR 88 | Ht 65.0 in

## 2022-04-11 DIAGNOSIS — R112 Nausea with vomiting, unspecified: Secondary | ICD-10-CM | POA: Diagnosis not present

## 2022-04-11 DIAGNOSIS — Z86010 Personal history of colonic polyps: Secondary | ICD-10-CM | POA: Diagnosis not present

## 2022-04-11 DIAGNOSIS — R197 Diarrhea, unspecified: Secondary | ICD-10-CM | POA: Diagnosis not present

## 2022-04-11 MED ORDER — PROMETHAZINE HCL 25 MG RE SUPP: 25.0000 mg | Freq: Four times a day (QID) | RECTAL | 0 refills | Status: AC | PRN

## 2022-04-11 NOTE — Patient Instructions (Signed)
We have sent the following medications to your pharmacy for you to pick up at your convenience: Phenergan suppositories to have on hand and use as needed   We will see you later this year for a colonoscopy.   I appreciate the opportunity to care for you. Silvano Rusk, MD, [facility]

## 2022-04-11 NOTE — Progress Notes (Signed)
Julea Hutto Blatz 56 y.o. [birth year] [id number]  Assessment & Plan:   Encounter Diagnoses  Name Primary?   Nausea vomiting and diarrhea Yes   Hx of adenomatous colonic polyps    She is improved.  Because of this nausea and vomiting and diarrhea is unclear.  I think we have to the erosions on capsule endoscopy of the small bowel nonspecific.  We will continue symptomatic treatment.  I have prescribed some promethazine suppositories to have on hand.  We discussed how recurrent C. difficile last year may have been an exacerbators of this problem.  She needs a close follow-up colonoscopy regarding cecal adenoma (status post 2 resections so far) later this year.  We have a recall planned for September.    Meds ordered this encounter  Medications   promethazine (PHENERGAN) 25 MG suppository    Sig: Place 1 suppository (25 mg total) rectally every 6 (six) hours as needed for nausea or vomiting.    Dispense:  12 each    Refill:  0   I appreciate the opportunity to care for this patient. CC: Ryter-Brown, Shyrl Numbers, MD    Subjective:   Chief Complaint: Follow-up of nausea vomiting and diarrhea problems and history of large cecal adenoma  HPI The patient is a 56 year old woman with a history of recurrent C. difficile polymyalgia rheumatica on chronic steroids, impaired glucose tolerance, hypertension and sleep apnea who is here for follow-up.  We have been working on recurrent nausea and vomiting and diarrhea problems, fortunately they were improved.  Work-up to date with EGD and colonoscopy and capsule endoscopy has shown some nonspecific erosions in the small bowel on capsule endoscopy April 2023.Suspected to be in the ileum.  She is only had 1 episode of nausea vomiting diarrhea lately and was able to abort it using ondansetron and Imodium or at least limited significantly.  She had not been using Imodium because of the C. difficile previously.  She is upbeat about things overall.   Continuing a very slow taper of prednisone for PMR.  Currently on 11 mg daily.  She thinks pantoprazole is helping with her symptomatology as well started in March.  She is on iron therapy for iron deficiency probably multifactorial.  Followed by hematology.  Hemoglobin is normal this spring.  Ferritin is normal as well.  Colonoscopy history as below: 04/14/21 3 adenomas max 25 mm  02/13/22 residual cecal adenoma recall 6 mos 07/2022   Wt Readings from Last 3 Encounters:  02/13/22 245 lb (111.1 kg)  12/12/21 245 lb 2 oz (111.2 kg)  10/16/21 253 lb 1.4 oz (114.8 kg)    No Active Allergies Current Meds  Medication Sig   acetaminophen (TYLENOL) 500 MG tablet Take 1,000 mg by mouth every 6 (six) hours as needed for headache.   alendronate (FOSAMAX) 35 MG tablet Take 35 mg by mouth every Sunday.   cholecalciferol (VITAMIN D) 25 MCG (1000 UNIT) tablet Take 1,000 Units by mouth at bedtime.   clonazePAM (KLONOPIN) 0.5 MG tablet Take 0.5 mg by mouth 2 (two) times daily as needed for anxiety.   DULoxetine (CYMBALTA) 60 MG capsule Take 120 mg by mouth daily.   ferrous gluconate (FERGON) 324 MG tablet Take 324 mg by mouth daily.   gabapentin (NEURONTIN) 300 MG capsule Take 300 mg by mouth at bedtime.   ibuprofen (ADVIL) 200 MG tablet Take 800 mg by mouth every 6 (six) hours as needed for headache or moderate pain.   losartan (COZAAR) 25 MG  tablet Take 25 mg by mouth daily.   MAGNESIUM-OXIDE 400 (240 Mg) MG tablet Take 1 tablet by mouth daily.   metFORMIN (GLUCOPHAGE) 500 MG tablet Take 1,000 mg by mouth 2 (two) times daily.   ondansetron (ZOFRAN) 4 MG tablet Take by mouth daily.   pantoprazole (PROTONIX) 40 MG tablet Take 1 tablet (40 mg total) by mouth daily before breakfast.   Polyethyl Glycol-Propyl Glycol (SYSTANE OP) Place 1 drop into both eyes daily.   predniSONE (DELTASONE) 10 MG tablet Take 20 mg by mouth daily.    rosuvastatin (CRESTOR) 10 MG tablet Take 10 mg by mouth at bedtime.    traZODone (DESYREL) 50 MG tablet Take 75 mg by mouth at bedtime.   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     She is an eating disorders dietitian   Never smoker 1-2 caffeinated beverages daily no alcohol no other tobacco and no drug use   family history includes Alcoholism in her father and paternal grandmother; Colon polyps in her mother; Depression in her maternal grandmother and mother; Diabetes in her father and maternal grandfather; Diverticulitis in her paternal grandmother; Heart attack in her maternal grandmother; Heart disease in her maternal grandfather and maternal uncle; Hyperlipidemia in her maternal grandmother and mother; Hypertension in her maternal grandfather, maternal grandmother, and mother; Non-Hodgkin's lymphoma in her maternal grandmother; Pancreatitis in her father; Stroke in her maternal uncle.   Review of Systems As per HPI  Objective:   Physical Exam BP 132/70   Pulse 88   Ht '5\' 5"'$  (1.651 m)   LMP 03/21/2012   BMI 40.77 kg/m

## 2022-05-21 ENCOUNTER — Other Ambulatory Visit: Payer: Self-pay | Admitting: Internal Medicine

## 2022-06-27 ENCOUNTER — Encounter: Payer: Self-pay | Admitting: Internal Medicine

## 2022-06-27 ENCOUNTER — Other Ambulatory Visit: Payer: Self-pay | Admitting: Internal Medicine

## 2022-06-27 MED ORDER — ONDANSETRON 8 MG PO TBDP: 8.0000 mg | ORAL_TABLET | Freq: Three times a day (TID) | ORAL | 2 refills | Status: DC | PRN

## 2022-06-29 NOTE — Telephone Encounter (Signed)
Pt was made aware of Dr. Carlean Purl recommendations Pt scheduled for a Colonoscopy with Dr. Carlean Purl on 07/27/2022 at 10:00 AM with Dr. Carlean Purl: Pt made aware: Pt was scheduled for a previsit appointment on 07/10/2022 at 11:00: Pt made aware:  Pt verbalized understanding with all questions answered.

## 2022-07-10 ENCOUNTER — Ambulatory Visit (AMBULATORY_SURGERY_CENTER): Payer: Self-pay | Admitting: *Deleted

## 2022-07-10 VITALS — Ht 65.0 in | Wt 236.6 lb

## 2022-07-10 DIAGNOSIS — Z86010 Personal history of colonic polyps: Secondary | ICD-10-CM

## 2022-07-10 NOTE — Progress Notes (Signed)
No egg or soy allergy known to patient  No issues known to pt with past sedation with any surgeries or procedures Patient denies ever being told they had issues or difficulty with intubation  No FH of Malignant Hyperthermia Pt is not on diet pills Pt is not on home 02  Pt is not on blood thinners  Pt denies issues with constipation  No A fib or A flutter Have any cardiac testing pending--NO Pt instructed to use [email or web address] or GoodRx for a price reduction on prep   

## 2022-07-13 ENCOUNTER — Encounter: Payer: Self-pay | Admitting: Internal Medicine

## 2022-07-19 ENCOUNTER — Telehealth: Payer: Self-pay | Admitting: Internal Medicine

## 2022-07-19 NOTE — Telephone Encounter (Signed)
PT needs to reschedule colonoscopy but cannot do it until November she wants to know if Dr. Carlean Purl would be ok postponing it until then since it is a high risk screening. Please advise.

## 2022-07-20 NOTE — Telephone Encounter (Signed)
November date is ok

## 2022-07-20 NOTE — Telephone Encounter (Signed)
Spoke with patient regarding MD recommendations. November [facility] schedule is currently not out & patient is aware we will contact her to schedule once it is released. Pt verbalized all understanding.  Staff reminder sent to Remo Lipps, RN.

## 2022-07-27 ENCOUNTER — Encounter: Payer: BC Managed Care – PPO | Admitting: Internal Medicine

## 2022-08-03 ENCOUNTER — Telehealth: Payer: Self-pay

## 2022-08-03 NOTE — Telephone Encounter (Signed)
Patient's colon has been rescheduled to 09/19/22 at 11:30 am with Dr. Carlean Purl. Updated copy of instructions were sent to patient's mychart. Patient had no questions & verbalized all understanding.

## 2022-08-28 ENCOUNTER — Other Ambulatory Visit: Payer: Self-pay | Admitting: Internal Medicine

## 2022-09-02 ENCOUNTER — Encounter: Payer: Self-pay | Admitting: Internal Medicine

## 2022-09-03 ENCOUNTER — Telehealth: Payer: Self-pay

## 2022-09-03 NOTE — Telephone Encounter (Signed)
Per [facility] message new instructions have been emailed to her at [email or web address] for her new date/time for a colonoscopy.

## 2022-09-13 ENCOUNTER — Encounter: Payer: Self-pay | Admitting: Certified Registered Nurse Anesthetist

## 2022-09-19 ENCOUNTER — Encounter: Payer: Self-pay | Admitting: Internal Medicine

## 2022-09-19 ENCOUNTER — Other Ambulatory Visit: Payer: Self-pay | Admitting: Internal Medicine

## 2022-09-19 ENCOUNTER — Ambulatory Visit (AMBULATORY_SURGERY_CENTER): Payer: BC Managed Care – PPO | Admitting: Internal Medicine

## 2022-09-19 VITALS — BP 89/65 | HR 78 | Temp 96.9°F | Resp 18 | Ht 65.0 in | Wt 236.0 lb

## 2022-09-19 DIAGNOSIS — D120 Benign neoplasm of cecum: Secondary | ICD-10-CM

## 2022-09-19 DIAGNOSIS — D124 Benign neoplasm of descending colon: Secondary | ICD-10-CM

## 2022-09-19 DIAGNOSIS — Z86010 Personal history of colonic polyps: Secondary | ICD-10-CM | POA: Diagnosis not present

## 2022-09-19 DIAGNOSIS — Z09 Encounter for follow-up examination after completed treatment for conditions other than malignant neoplasm: Secondary | ICD-10-CM

## 2022-09-19 MED ORDER — SODIUM CHLORIDE 0.9 % IV SOLN: 500.0000 mL | Freq: Once | INTRAVENOUS | Status: DC

## 2022-09-19 NOTE — Progress Notes (Signed)
Report given to PACU, vss 

## 2022-09-19 NOTE — Progress Notes (Signed)
Called to room to assist during endoscopic procedure.  Patient ID and intended procedure confirmed with present staff. Received instructions for my participation in the procedure from the performing physician.  

## 2022-09-19 NOTE — Progress Notes (Signed)
[facility] History and Physical   Primary Care Physician:  Wayland Salinas, MD   Reason for Procedure:   Colon polyp f/u  Plan:    colonoscopy     HPI: Diana Herman is a 56 y.o. female s/p resection of adenomas max 25 mm 5/22 and there was residual cecal adenoma 3/23 - here for close f/u to determine eradication of cecal polyp   Past Medical History:  Diagnosis Date   Achilles rupture, right 08/18/2018   Formatting of this note might be different from the original. 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 Authorizing Provider  alendronate (FOSAMAX) 35 MG tablet Take 35 mg by mouth every Sunday. 05/30/20  Yes [provider]  Blood Glucose Monitoring Suppl (CONTOUR NEXT GEN MONITOR) w/Device KIT USE TO CHECK BLOOD SUGAR AS DIRECTED 08/21/22  Yes [provider]   cholecalciferol (VITAMIN D) 25 MCG (1000 UNIT) tablet Take 1,000 Units by mouth at bedtime.   Yes [provider]  CONTOUR NEXT TEST test strip daily. 08/16/22  Yes [provider]  DULoxetine (CYMBALTA) 60 MG capsule Take 120 mg by mouth daily. 05/30/20  Yes [provider]  ferrous gluconate (FERGON) 324 MG tablet Take 324 mg by mouth daily. 09/13/21  Yes [provider]  gabapentin (NEURONTIN) 300 MG capsule Take 300 mg by mouth at bedtime. Takes 155m TID 01/18/21  Yes [provider]  Lancets (ONETOUCH DELICA PLUS [id number] [place] TO TEST BLOOD GLUCOSE ONCE DAILY 08/16/22  Yes [provider]  losartan (COZAAR) 25 MG tablet Take 25 mg by mouth daily. 10/27/21  Yes [provider]  MAGNESIUM-OXIDE 400 (240 Mg) MG tablet Take 1 tablet by mouth daily. 11/20/21  Yes [provider]  metFORMIN (GLUCOPHAGE) 1000 MG tablet Take 1,000 mg by mouth 2 (two) times daily. 08/30/22  Yes [provider]  pantoprazole (PROTONIX) 40 MG tablet TAKE 1 TABLET(40 MG) BY MOUTH DAILY BEFORE BREAKFAST 05/21/22  Yes GGatha Mayer MD  pilocarpine (SALAGEN) 5 MG tablet Take 5 mg by mouth 3 (three) times daily. 07/02/22  Yes [provider]  predniSONE (DELTASONE) 1 MG tablet Take  by mouth. 08/30/22  Yes [provider]  rosuvastatin (CRESTOR) 10 MG tablet Take 10 mg by mouth at bedtime. 07/10/21  Yes [provider]  traZODone (DESYREL) 50 MG tablet Take 75 mg by mouth at bedtime. 05/30/20  Yes [provider]  acetaminophen (TYLENOL) 500 MG tablet Take 1,000 mg by mouth every 6 (six) hours as needed for headache.    [provider]  clonazePAM (KLONOPIN) 0.5 MG tablet Take 0.5 mg by mouth 2 (two) times daily as needed for anxiety. 07/19/08   [provider]  ibuprofen (ADVIL) 200 MG tablet Take 800 mg by mouth every 6 (six) hours as needed for headache or moderate pain.    [provider]  KEVZARA 200 MG/1.14ML SOSY SMARTSIG:SUB-Q 07/05/22   [provider]  ondansetron (ZOFRAN-ODT) 8 MG disintegrating tablet DISSOLVE 1 TABLET(8 MG) ON THE TONGUE EVERY 8 HOURS AS NEEDED FOR NAUSEA OR VOMITING 08/28/22   Gatha Mayer, MD  Polyethyl Glycol-Propyl Glycol (SYSTANE OP) Place 1 drop into both eyes daily.    [provider]  promethazine (PHENERGAN) 25 MG suppository Place 1 suppository (25 mg total) rectally every 6 (six) hours as needed for nausea or vomiting. Patient not taking: Reported on 07/10/2022 04/11/22   Gatha Mayer, MD    Current Outpatient Medications  Medication Sig Dispense Refill   alendronate (FOSAMAX) 35 MG tablet Take 35 mg by mouth every Sunday.     Blood Glucose Monitoring Suppl (CONTOUR NEXT GEN MONITOR) w/Device KIT USE TO CHECK BLOOD SUGAR AS DIRECTED     cholecalciferol (VITAMIN D) 25 MCG (1000 UNIT) tablet Take 1,000 Units by mouth at bedtime.     CONTOUR NEXT TEST test strip daily.     DULoxetine (CYMBALTA) 60 MG capsule Take 120 mg by mouth daily.     ferrous gluconate (FERGON) 324 MG tablet Take 324 mg by mouth daily.     gabapentin (NEURONTIN) 300 MG capsule Take 300 mg by mouth at bedtime. Takes 178m TID     Lancets (ONETOUCH DELICA PLUS [id number] MISC USE TO TEST BLOOD GLUCOSE ONCE DAILY     losartan (COZAAR) 25 MG tablet Take 25 mg by mouth daily.     MAGNESIUM-OXIDE 400 (240 Mg) MG tablet Take 1 tablet by mouth daily.     metFORMIN (GLUCOPHAGE) 1000 MG tablet Take 1,000 mg by mouth 2 (two) times daily.     pantoprazole (PROTONIX) 40 MG tablet TAKE 1 TABLET(40 MG) BY MOUTH DAILY BEFORE BREAKFAST 90 tablet 1   pilocarpine (SALAGEN) 5 MG tablet Take 5 mg by mouth 3 (three) times daily.     predniSONE (DELTASONE) 1 MG tablet Take by mouth.     rosuvastatin (CRESTOR) 10 MG tablet Take 10 mg by mouth at bedtime.     traZODone (DESYREL) 50 MG tablet Take 75 mg by mouth at bedtime.     acetaminophen (TYLENOL) 500 MG tablet Take  1,000 mg by mouth every 6 (six) hours as needed for headache.     clonazePAM (KLONOPIN) 0.5 MG tablet Take 0.5 mg by mouth 2 (two) times daily as needed for anxiety.     ibuprofen (ADVIL) 200 MG tablet Take 800 mg by mouth every 6 (six) hours as needed for headache or moderate pain.     KEVZARA 200 MG/1.14ML SOSY SMARTSIG:SUB-Q     ondansetron (ZOFRAN-ODT) 8 MG disintegrating tablet DISSOLVE 1 TABLET(8 MG) ON THE TONGUE EVERY 8 HOURS AS NEEDED FOR NAUSEA OR VOMITING 30  tablet 2   Polyethyl Glycol-Propyl Glycol (SYSTANE OP) Place 1 drop into both eyes daily.     promethazine (PHENERGAN) 25 MG suppository Place 1 suppository (25 mg total) rectally every 6 (six) hours as needed for nausea or vomiting. (Patient not taking: Reported on 07/10/2022) 12 each 0   Current Facility-Administered Medications  Medication Dose Route Frequency Provider Last Rate Last Admin   0.9 %  sodium chloride infusion  500 mL Intravenous Once Gatha Mayer, MD        Allergies as of 09/19/2022   (No Known Allergies)    Family History  Problem Relation Age of Onset   Colon polyps Mother    Depression Mother    Hypertension Mother    Hyperlipidemia Mother    Diabetes Father    Alcoholism Father    Pancreatitis Father    Heart disease Maternal Uncle    Stroke Maternal Uncle    Non-Hodgkin's lymphoma Maternal Grandmother    Heart attack Maternal Grandmother    Depression Maternal Grandmother    Hypertension Maternal Grandmother    Hyperlipidemia Maternal Grandmother    Diabetes Maternal Grandfather    Heart disease Maternal Grandfather    Hypertension Maternal Grandfather    Alcoholism Paternal Grandmother    Diverticulitis Paternal Grandmother    Colon cancer Neg Hx    Esophageal cancer Neg Hx    Rectal cancer Neg Hx    Stomach cancer Neg Hx    Crohn's disease Neg Hx    Ulcerative colitis Neg Hx     Social History   Socioeconomic History   Marital status: Single    Spouse name: Not on file    Number of children: 0   Years of education: Not on file   Highest education level: Not on file  Occupational History   Occupation: Eating Disorder Dietitian  Tobacco Use   Smoking status: Never    Passive exposure: Never   Smokeless tobacco: Never  Vaping Use   Vaping Use: Never used  Substance and Sexual Activity   Alcohol use: Yes    Comment: once a year   Drug use: Never   Sexual activity: Not Currently  Other Topics Concern   Not on file  Social History Narrative   Patient is single, no children.     She is an eating disorders dietitian   Never smoker 1-2 caffeinated beverages daily no alcohol no other tobacco and no drug use   Social Determinants of Radio broadcast assistant Strain: Not on file  Food Insecurity: Not on file  Transportation Needs: Not on file  Physical Activity: Not on file  Stress: Not on file  Social Connections: Not on file  Intimate Partner Violence: Not on file    Review of Systems:  All other review of systems negative except as mentioned in the HPI.  Physical Exam: Vital signs BP 118/63   Pulse 82   Temp (!) 96.9 F (36.1 C) (Temporal)   Ht _0  (1.651 m)   Wt 236 lb (107 kg)   LMP 03/21/2012   SpO2 97%   BMI 39.27 kg/m   General:   Alert,  Well-developed, well-nourished, pleasant and cooperative in NAD Lungs:  Clear throughout to auscultation.   Heart:  Regular rate and rhythm; no murmurs, clicks, rubs,  or gallops. Abdomen:  Soft, nontender and nondistended. Normal bowel sounds.   Neuro/Psych:  Alert and cooperative. Normal mood and affect. A and O x 3   _1   Simonne Maffucci, MD, [facility] [phone] (pager) 09/19/2022 11:51 AM@

## 2022-09-19 NOTE — Patient Instructions (Addendum)
There was a very small polyp at the original site - removed. Another new very tiny polyp and 2 other small polyps removed.  I will let you know pathology results and when to have another routine colonoscopy by mail and/or My Chart.  I appreciate the opportunity to care for you. Gatha Mayer, MD, [facility]   Handout provided on polyps.  Continue present medications. Await pathology results.   YOU HAD AN ENDOSCOPIC PROCEDURE TODAY AT [facility]:   Refer to the procedure report that was given to you for any specific questions about what was found during the examination.  If the procedure report does not answer your questions, please call your gastroenterologist to clarify.  If you requested that your care partner not be given the details of your procedure findings, then the procedure report has been included in a sealed envelope for you to review at your convenience later.  YOU SHOULD EXPECT: Some feelings of bloating in the abdomen. Passage of more gas than usual.  Walking can help get rid of the air that was put into your GI tract during the procedure and reduce the bloating. If you had a lower endoscopy (such as a colonoscopy or flexible sigmoidoscopy) you may notice spotting of blood in your stool or on the toilet paper. If you underwent a bowel prep for your procedure, you may not have a normal bowel movement for a few days.  Please Note:  You might notice some irritation and congestion in your nose or some drainage.  This is from the oxygen used during your procedure.  There is no need for concern and it should clear up in a day or so.  SYMPTOMS TO REPORT IMMEDIATELY:  Following lower endoscopy (colonoscopy or flexible sigmoidoscopy):  Excessive amounts of blood in the stool  Significant tenderness or worsening of abdominal pains  Swelling of the abdomen that is new, acute  Fever of 100F or higher  For urgent or emergent issues, a gastroenterologist can be reached at any  hour by calling [phone]. Do not use MyChart messaging for urgent concerns.    DIET:  We do recommend a small meal at first, but then you may proceed to your regular diet.  Drink plenty of fluids but you should avoid alcoholic beverages for 24 hours.  ACTIVITY:  You should plan to take it easy for the rest of today and you should NOT DRIVE or use heavy machinery until tomorrow (because of the sedation medicines used during the test).    FOLLOW UP: Our staff will call the number listed on your records the next business day following your procedure.  We will call around 7:15- 8:00 am to check on you and address any questions or concerns that you may have regarding the information given to you following your procedure. If we do not reach you, we will leave a message.     If any biopsies were taken you will be contacted by phone or by letter within the next 1-3 weeks.  Please call us at [phone] if you have not heard about the biopsies in 3 weeks.    SIGNATURES/CONFIDENTIALITY: You and/or your care partner have signed paperwork which will be entered into your electronic medical record.  These signatures attest to the fact that that the information above on your After Visit Summary has been reviewed and is understood.  Full responsibility of the confidentiality of this discharge information lies with you and/or your care-partner.

## 2022-09-19 NOTE — Op Note (Signed)
[facility] Patient Name: Diana Herman Procedure Date: 09/19/2022 11:52 AM MRN: [id number] Endoscopist: Gatha Mayer , MD, [id number] Age: 56 Referring MD:  Date of Birth: 16-Jul-1966 Gender: Female Account #: [id number] Procedure:                Colonoscopy Indications:              Surveillance: Piecemeal removal of large sessile                            adenoma last colonoscopy (< 3 yrs) Medicines:                Monitored Anesthesia Care Procedure:                Pre-Anesthesia Assessment:                           - Prior to the procedure, a History and Physical                            was performed, and patient medications and                            allergies were reviewed. The patient's tolerance of                            previous anesthesia was also reviewed. The risks                            and benefits of the procedure and the sedation                            options and risks were discussed with the patient.                            All questions were answered, and informed consent                            was obtained. Prior Anticoagulants: The patient has                            taken no anticoagulant or antiplatelet agents. ASA                            Grade Assessment: II - A patient with mild systemic                            disease. After reviewing the risks and benefits,                            the patient was deemed in satisfactory condition to                            undergo the procedure.  After obtaining informed consent, the colonoscope                            was passed under direct vision. Throughout the                            procedure, the patient's blood pressure, pulse, and                            oxygen saturations were monitored continuously. The                            CF [id number] #[id number] was introduced through the anus                            and advanced to the  the cecum, identified by                            appendiceal orifice and ileocecal valve. The                            colonoscopy was performed without difficulty. The                            patient tolerated the procedure well. The quality                            of the bowel preparation was good. The bowel                            preparation used was Miralax via split dose                            instruction. Scope In: 11:59:27 AM Scope Out: 12:20:34 PM Scope Withdrawal Time: 0 hours 16 minutes 36 seconds  Total Procedure Duration: 0 hours 21 minutes 7 seconds  Findings:                 The perianal and digital rectal examinations were                            normal.                           A 5 mm polyp was found in the cecum. The polyp was                            sessile. The polyp was removed with a cold snare.                            Resection and retrieval were complete. Verification                            of patient identification for the specimen was  done. Estimated blood loss was minimal.                           A 1 mm polyp was found in the cecum. The polyp was                            sessile. The polyp was removed with a cold biopsy                            forceps. Resection and retrieval were complete.                            Verification of patient identification for the                            specimen was done. Estimated blood loss was minimal.                           Two sessile polyps were found in the descending                            colon. The polyps were 2 to 5 mm in size. These                            polyps were removed with a cold snare. Resection                            and retrieval were complete. Verification of                            patient identification for the specimen was done.                            Estimated blood loss was minimal.                            The exam was otherwise without abnormality on                            direct and retroflexion views. Complications:            No immediate complications. Estimated Blood Loss:     Estimated blood loss was minimal. Impression:               - One 5 mm polyp in the cecum, removed with a cold                            snare. Resected and retrieved. This was residula                            polyp from prior large polypectomy x 2 - treated  with soft tip coag also                           - One 1 mm polyp in the cecum, removed with a cold                            biopsy forceps. Resected and retrieved. New polyp                           - Two 2 to 5 mm polyps in the descending colon,                            removed with a cold snare. Resected and retrieved.                           - The examination was otherwise normal on direct                            and retroflexion views.                           - Hx large cecal adenoma - this is 3rd colonoscopy                            in removal of that + other adenomas Recommendation:           - Patient has a contact number available for                            emergencies. The signs and symptoms of potential                            delayed complications were discussed with the                            patient. Return to normal activities tomorrow.                            Written discharge instructions were provided to the                            patient.                           - Resume previous diet.                           - Continue present medications.                           - Await pathology results.                           - Repeat colonoscopy is recommended for  surveillance. The colonoscopy date will be                            determined after pathology results from today's                            exam become available for review. Gatha Mayer, MD 09/19/2022 12:29:21 PM This report has been signed electronically.

## 2022-09-20 ENCOUNTER — Telehealth: Payer: Self-pay

## 2022-09-20 NOTE — Telephone Encounter (Signed)
Follow up call placed, VM obtained and message left. 

## 2022-09-24 ENCOUNTER — Encounter: Payer: Self-pay | Admitting: Internal Medicine

## 2022-10-22 ENCOUNTER — Encounter: Payer: Self-pay | Admitting: Internal Medicine

## 2022-11-12 NOTE — Therapy (Signed)
OUTPATIENT PHYSICAL THERAPY LOWER EXTREMITY EVALUATION   Patient Name: Diana Herman MRN: [id number] DOB:July 02, 1966, 56 y.o., female Today's Date: 11/13/2022  END OF SESSION:  PT End of Session - 11/13/22 1342     Visit Number 1    Number of Visits 14    Date for PT Re-Evaluation 01/01/23    Authorization Type [employer]    PT Start Time 1200    PT Stop Time 1245    PT Time Calculation (min) 45 min    Activity Tolerance Patient tolerated treatment well    Behavior During Therapy [facility] for tasks assessed/performed             Past Medical History:  Diagnosis Date   Achilles rupture, right 08/18/2018   Formatting of this note might be different from the original. 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   Severe sepsis with acute organ dysfunction ([place]) 10/15/2021   Sleep apnea    wears cpap    Past Surgical History:  Procedure Laterality Date   ACHILLES TENDON SURGERY Right    COLONOSCOPY  04/14/2021   [facility]   COLONOSCOPY WITH ESOPHAGOGASTRODUODENOSCOPY (EGD)  01/2022   HYSTERECTOMY     hysteroscopy and polypectomy     POLYPECTOMY     TONSILLECTOMY     WISDOM TOOTH EXTRACTION     early 90's    Patient Active Problem List   Diagnosis Date Noted   Depression with anxiety 10/15/2021   Nausea vomiting and diarrhea 08/13/2021   Type 2 diabetes mellitus without complication ([place])  99/24/2683   Hx of adenomatous colonic polyps 04/28/2021   Long term current use of systemic steroids 09/21/2020   Primary hypertension 08/30/2020   Morbid obesity ([place]) 01/01/2019   Polymyalgia rheumatica ([place]) 08/27/2017   Primary osteoarthritis of left knee 06/30/2016   Primary osteoarthritis of right knee 06/30/2016   Anxiety 01/17/2016   Impaired glucose tolerance 01/17/2016   Mixed hyperlipidemia 01/17/2016   OSA on CPAP 01/17/2016    PCP: [facility]   REFERRING PROVIDER: Donzetta Sprung., MD   REFERRING DIAG: Bilateral primary osteoarthritis of knee   THERAPY DIAG:  Chronic pain of left knee  Chronic pain of right knee  Muscle weakness (generalized)  Gait abnormality  Rationale for Evaluation and Treatment: Rehabilitation  ONSET DATE: 15 yrs  SUBJECTIVE:   SUBJECTIVE STATEMENT:  Not afraid of water; no access to pool at this time, maybe plan to gain access Knee arthritis originated from falls. 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 Basically sedentary, lost 30 lbs to get ready for TKR which is scheduled Jan 08, 2023. Fibromyalgia, Polymyalgia rheumatica ([place]). Pt reports peripheral neuropathy's, DM controlled all which complicate pain. She is a Automotive engineer and maintains  good health, just isn't an exercise person     Past Medical History:  Diagnosis Date   Achilles rupture, right 08/18/2018   Formatting of this note might be different from the original. 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 She has been on steroids for a long time and has now weaned off of the steroids for several weeks now. Since coming off the steroids the knee pain that she has been having has worsened. She would like to consider doing a total knee replacement on her right knee. She does have a history of C. difficile colitis and has had sepsis associated with this in the past from antibiotics. " PAIN:  Are you having pain? Yes: NPRS scale: current 3/10  least 0/10 worst 4/10 Pain location: R>L in past some days toss up Pain description: sharp/ needle pain (no ache) takes breath away Aggravating factors: any movement, walking,  STS Relieving factors: sitting; ice  PRECAUTIONS: Knee  WEIGHT BEARING RESTRICTIONS: No  FALLS:  Has patient fallen in last 6 months? 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 there is a calcified loose body in the suprapatellar region of the left knee.   PATIENT SURVEYS:  FOTO 38% with goal of 50%  COGNITION: Overall cognitive status: Within functional limits for tasks assessed     SENSATION: Bilateral feet: pins and needled bottom of feet  EDEMA:  N/A  MUSCLE LENGTH: Hamstrings: Right 85 deg; Left 90 deg   POSTURE: No Significant postural limitations  PALPATION: Crepitus R>L  LOWER EXTREMITY ROM:  Active ROM Right eval Left eval  Hip flexion    Hip extension    Hip abduction    Hip adduction    Hip internal rotation    Hip external rotation    Knee flexion 120 130  Knee extension -10 0  Ankle dorsiflexion    Ankle plantarflexion    Ankle inversion    Ankle eversion     (Blank rows = not tested)  LOWER EXTREMITY MMT:  MMT Right eval Left eval  Hip flexion 4 5-  Hip extension 5 5  Hip abduction 5 5  Hip adduction 5 5  Hip internal rotation    Hip external rotation    Knee flexion 4 P! 4+ P!  Knee extension 5 5  Ankle dorsiflexion    Ankle plantarflexion    Ankle inversion    Ankle eversion     (Blank rows = not tested)  LOWER EXTREMITY SPECIAL TESTS:  Knee special tests: Anterior drawer  test: negative and Posterior drawer test: negative Thomas test: Right pos; Left neg  FUNCTIONAL TESTS:  5 times sit to stand: 17.07 Timed up and go (TUG): 12.55 SLS right 5s  Right 3s  GAIT: Distance walked: 400 Assistive device utilized: none Level of assistance: Complete Independence Comments: Antalgic right and left, decreased hip/knee flex; increased lateral displacement/hip circumduction   TODAY'S TREATMENT:                                                                                                                              DATE: 10/14/22    PATIENT EDUCATION:  Education details: Discussed eval findings, rehab rationale and POC and patient is in agreement  Person educated: Patient Education method: Explanation Education comprehension: verbalized understanding  HOME EXERCISE PROGRAM: To be assigned  ASSESSMENT:  CLINICAL IMPRESSION: Patient is a 56 y.o. f who was seen today for physical therapy evaluation and treatment for arthritis bilat knees. She has multiple dx which play into her overall pain.  She is scheduled for a TKR Dr Lissa Morales. Laurance Flatten on Jan 08, 2023.  She presents today with some lack of strength and ROM R<L.  Pain is ~ the same bilaterally although up until recently she reports R>L. She does have slight shortening of right iliopsoas as per Marcello Moores test and she is demonstrating a lack of balance  ability with SLS. Her biggest concerns are lack of stamina, pain and inability to balance well. She will benefit from skilled Physical therapy beginning aquatic based then will plan to move to land based for a few visits prior to DC.  OBJECTIVE IMPAIRMENTS: Abnormal gait, decreased activity tolerance, decreased balance, decreased endurance, decreased mobility, difficulty walking, decreased ROM, decreased strength, impaired sensation, and pain.   ACTIVITY LIMITATIONS: carrying, lifting, bending, sitting, standing, squatting, sleeping, stairs, transfers, and locomotion  level  PARTICIPATION LIMITATIONS: cleaning, laundry, shopping, community activity, occupation, and yard work  PERSONAL FACTORS: Fitness, Past/current experiences, Time since onset of injury/illness/exacerbation, and 3+ comorbidities: Polymyalgia rheumatica; fibromyalgia; DM; peripheral neuropathies  are also affecting patient's functional outcome.   REHAB POTENTIAL: Good  CLINICAL DECISION MAKING: Evolving/moderate complexity  EVALUATION COMPLEXITY: Moderate   GOALS: Goals reviewed with patient? Yes  SHORT TERM GOALS: Target date: 12/11/22 Pt will improve right knee extension up to -5d Baseline:-10 Goal status: INITIAL  2.  Pt will report worst pain 3/10 or less with functional activity and less occurrence of sleep disturbance d/t pain.  Baseline: worst 4/10 Goal status: INITIAL  3.  Pt will be able to climb 2-3 stairs using safe pattern ue unsupported to demonstrate  improved safety entering/exiting home Baseline: step to 2 step with pain and freight (no rails at home) Goal status: INITIAL  4.  Pt will tolerate multiple sessions without needing multiple rest periods. Baseline: gets winded walking through home Goal status: INITIAL    LONG TERM GOALS: Target date: 01/01/23  Pt will improve FOTO functional score in order to demonstrate a statistically significant improvement in functional mobility and quality of life.  Baseline: 38% Goal status: INITIAL  2.  Pt will be independent with final HEP to improve their ability to self-manage symptoms  Baseline: no HEP Goal status: INITIAL  3.  Pt will be knowledgeable and will be able to teach back proper technique to rise from floor Baseline: unable/afraid Goal status: INITIAL  4.  RLE strength will be = LLE for improved functional mobility and to be prepared for surgical procedure Baseline: R weaker than L Goal status: INITIAL   PLAN:  PT FREQUENCY: 1-2x/week  PT DURATION:  7weeks sessions  PLANNED INTERVENTIONS:  Therapeutic exercises, Therapeutic activity, Neuromuscular re-education, Balance training, Gait training, Patient/Family education, Self Care, Joint mobilization, Joint manipulation, Stair training, Orthotic/Fit training, DME instructions, Aquatic Therapy, Dry Needling, Electrical stimulation, Spinal manipulation, Spinal mobilization, Cryotherapy, scar mobilization, Splintting, Taping, Traction, Ionotophoresis '4mg'$ /ml Dexamethasone, Manual therapy, and Re-evaluation  PLAN FOR NEXT SESSION: Aquatics and land based: LE strengthening and ROM/stretching; balance retraining (reactive), proprioceptive/kinesthetic retraining; stair climbing instruction; aerobic capacity retraining   Denton Meek, PTMPT 11/13/22  139p

## 2022-11-13 ENCOUNTER — Encounter (HOSPITAL_BASED_OUTPATIENT_CLINIC_OR_DEPARTMENT_OTHER): Payer: Self-pay | Admitting: Physical Therapy

## 2022-11-13 ENCOUNTER — Ambulatory Visit (HOSPITAL_BASED_OUTPATIENT_CLINIC_OR_DEPARTMENT_OTHER): Payer: BC Managed Care – PPO | Attending: Sports Medicine | Admitting: Physical Therapy

## 2022-11-13 DIAGNOSIS — R269 Unspecified abnormalities of gait and mobility: Secondary | ICD-10-CM | POA: Diagnosis present

## 2022-11-13 DIAGNOSIS — M25562 Pain in left knee: Secondary | ICD-10-CM | POA: Insufficient documentation

## 2022-11-13 DIAGNOSIS — M25561 Pain in right knee: Secondary | ICD-10-CM | POA: Diagnosis not present

## 2022-11-13 DIAGNOSIS — M6281 Muscle weakness (generalized): Secondary | ICD-10-CM | POA: Insufficient documentation

## 2022-11-13 DIAGNOSIS — G8929 Other chronic pain: Secondary | ICD-10-CM | POA: Insufficient documentation

## 2022-11-20 ENCOUNTER — Ambulatory Visit (HOSPITAL_BASED_OUTPATIENT_CLINIC_OR_DEPARTMENT_OTHER): Payer: BC Managed Care – PPO | Admitting: Physical Therapy

## 2022-11-20 ENCOUNTER — Encounter (HOSPITAL_BASED_OUTPATIENT_CLINIC_OR_DEPARTMENT_OTHER): Payer: Self-pay

## 2022-11-23 ENCOUNTER — Ambulatory Visit (HOSPITAL_BASED_OUTPATIENT_CLINIC_OR_DEPARTMENT_OTHER): Payer: BC Managed Care – PPO | Attending: Sports Medicine | Admitting: Physical Therapy

## 2022-11-23 ENCOUNTER — Encounter (HOSPITAL_BASED_OUTPATIENT_CLINIC_OR_DEPARTMENT_OTHER): Payer: Self-pay | Admitting: Physical Therapy

## 2022-11-23 DIAGNOSIS — G8929 Other chronic pain: Secondary | ICD-10-CM | POA: Insufficient documentation

## 2022-11-23 DIAGNOSIS — M25561 Pain in right knee: Secondary | ICD-10-CM | POA: Insufficient documentation

## 2022-11-23 DIAGNOSIS — M25562 Pain in left knee: Secondary | ICD-10-CM | POA: Insufficient documentation

## 2022-11-23 DIAGNOSIS — M6281 Muscle weakness (generalized): Secondary | ICD-10-CM | POA: Insufficient documentation

## 2022-11-23 DIAGNOSIS — R269 Unspecified abnormalities of gait and mobility: Secondary | ICD-10-CM | POA: Diagnosis present

## 2022-11-23 NOTE — Therapy (Signed)
OUTPATIENT PHYSICAL THERAPY LOWER EXTREMITY TREATMENT   Patient Name: Diana Herman MRN: [id number] DOB:1966-01-21, 57 y.o., female Today's Date: 11/23/2022  END OF SESSION:  PT End of Session - 11/23/22 1559     Visit Number 2    Number of Visits 14    Date for PT Re-Evaluation 01/01/23    Authorization Type BC[employer]    PT Start Time 1558    PT Stop Time 4401    PT Time Calculation (min) 43 min    Activity Tolerance Patient tolerated treatment well    Behavior During Therapy [facility] for tasks assessed/performed             Past Medical History:  Diagnosis Date   Achilles rupture, right 08/18/2018   Formatting of this note might be different from the original. 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   Severe sepsis with acute organ dysfunction ([place]) 10/15/2021   Sleep apnea    wears cpap    Past Surgical History:  Procedure Laterality Date   ACHILLES TENDON SURGERY Right    COLONOSCOPY  04/14/2021   [facility]   COLONOSCOPY WITH ESOPHAGOGASTRODUODENOSCOPY (EGD)  01/2022   HYSTERECTOMY     hysteroscopy and polypectomy     POLYPECTOMY     TONSILLECTOMY     WISDOM TOOTH EXTRACTION     early 90's    Patient Active Problem List   Diagnosis Date Noted   Depression with anxiety 10/15/2021   Nausea vomiting and diarrhea 08/13/2021   Type 2 diabetes mellitus without complication ([place])  64/40/3474   Hx of adenomatous colonic polyps 04/28/2021   Long term current use of systemic steroids 09/21/2020   Primary hypertension 08/30/2020   Morbid obesity ([place]) 01/01/2019   Polymyalgia rheumatica ([place]) 08/27/2017   Primary osteoarthritis of left knee 06/30/2016   Primary osteoarthritis of right knee 06/30/2016   Anxiety 01/17/2016   Impaired glucose tolerance 01/17/2016   Mixed hyperlipidemia 01/17/2016   OSA on CPAP 01/17/2016    PCP: [facility]   REFERRING PROVIDER: Donzetta Sprung., MD   REFERRING DIAG: Bilateral primary osteoarthritis of knee   THERAPY DIAG:  Chronic pain of left knee  Chronic pain of right knee  Muscle weakness (generalized)  Gait abnormality  Rationale for Evaluation and Treatment: Rehabilitation  ONSET DATE: 15 yrs  SUBJECTIVE:   SUBJECTIVE STATEMENT:  No new changes since eval.  She is excited to get into the water.     FROM EVAL: Not afraid of water; no access to pool at this time, maybe plan to gain access Knee arthritis originated from falls. 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 Basically sedentary, lost 30 lbs to get ready for TKR which is scheduled Jan 08, 2023.  Fibromyalgia, Polymyalgia rheumatica ([place]). Pt reports peripheral neuropathy's, DM controlled all which complicate pain. She is a Automotive engineer and maintains good health, just isn't an exercise person     Past Medical History:  Diagnosis Date   Achilles rupture, right 08/18/2018   Formatting of this note might be different from the original. 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 She has been on steroids for a long time and has now weaned off of the steroids for several weeks now. Since coming off the steroids the knee pain that she has been having has worsened. She would like to consider doing a total knee replacement on her right knee. She does have a history of C. difficile colitis and has had sepsis associated with this in the past from antibiotics. " PAIN:  Are you having pain? Yes: NPRS scale: 3-4/10 Pain location: bilat knees  Pain description: sharp/ needle pain (no ache) takes breath away Aggravating  factors: any movement, walking, STS Relieving factors: sitting; ice  PRECAUTIONS: Knee  WEIGHT BEARING RESTRICTIONS: No  FALLS:  Has patient fallen in last 6 months? 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 there is a calcified loose body in the suprapatellar region of the left knee.   PATIENT SURVEYS:  FOTO 38% with goal of 50%  COGNITION: Overall cognitive status: Within functional limits for tasks assessed     SENSATION: Bilateral feet: pins and needled bottom of feet  EDEMA:  N/A  MUSCLE LENGTH: Hamstrings: Right 85 deg; Left 90 deg   POSTURE: No Significant postural limitations  PALPATION: Crepitus R>L  LOWER EXTREMITY ROM:  Active ROM Right eval Left eval  Hip flexion    Hip extension    Hip abduction    Hip adduction    Hip internal rotation    Hip external rotation    Knee flexion 120 130  Knee extension -10 0  Ankle dorsiflexion    Ankle plantarflexion    Ankle inversion    Ankle eversion     (Blank rows = not tested)  LOWER EXTREMITY MMT:  MMT Right eval Left eval  Hip flexion 4 5-  Hip extension 5 5  Hip abduction 5 5  Hip adduction 5 5  Hip internal rotation    Hip external rotation    Knee flexion 4 P! 4+ P!  Knee extension 5 5  Ankle dorsiflexion    Ankle plantarflexion    Ankle inversion    Ankle eversion     (Blank rows = not tested)  LOWER EXTREMITY SPECIAL TESTS:  Knee  special tests: Anterior drawer test: negative and Posterior drawer test: negative Thomas test: Right pos; Left neg  FUNCTIONAL TESTS:  5 times sit to stand: 17.07 Timed up and go (TUG): 12.55 SLS right 5s  Right 3s  GAIT: Distance walked: 400 Assistive device utilized: none Level of assistance: Complete Independence Comments: Antalgic right and left, decreased hip/knee flex; increased lateral displacement/hip circumduction   TODAY'S TREATMENT:                                                                                                                              Pt seen for aquatic therapy today.  Treatment took place in water 3.25-4.5 ft in depth at the [facility]. Temp of water was 91.  Pt entered/exited the pool via stairs facing backwards going in, forward going out with heavy UE on bilat rail. * intro to [employer] * holding yellow noodle: forward/ backward gait, side stepping with varied step height  * holding white barbell: high knee marching forward backward; small kicks forward * holding wall: heel/toe raises x 15; hip circles (straight knee) CW/CCW x 10 each;  hip openers x 10 each side; 1/2 diamonds x 10  * STS on bench with blue step under feet x 10, with forward arm reach * forward step ups x 12 each  LE (initially painful with RLE, improved to painfree after 2 reps) * quad stretch with foot on 2nd step (minimal stretch) and hamstring stretch with foot on 3rd step (very flexible)  * return to walking with white barbell:  forward/backward and side stepping  * straddling yellow noodle with yellow hand floats for support: cycling forward 2 laps    Pt requires the buoyancy and hydrostatic pressure of water for support, and to offload joints by unweighting joint load by at least 50 % in navel deep water and by at least 75-80% in chest to neck deep water.  Viscosity of the water is needed for resistance of strengthening. Water current perturbations provides  challenge to standing balance requiring increased core activation.      PATIENT EDUCATION:  Education details: aquatic progressions/ modifications  Person educated: Patient Education method: Explanation Education comprehension: verbalized understanding  HOME EXERCISE PROGRAM: To be assigned  ASSESSMENT:  CLINICAL IMPRESSION: Pt is confident in aquatic environment and can take direction from therapist on deck.  She preferred to have UE support on floatation device during session. Initially she had pain with side stepping and stairs, but pain improved with time and repetition.  She will benefit from skilled Physical therapy beginning aquatic based then will plan to move to land based for a few visits prior to DC. Goals are ongoing.   OBJECTIVE IMPAIRMENTS: Abnormal gait, decreased activity tolerance, decreased balance, decreased endurance, decreased mobility, difficulty walking, decreased ROM, decreased strength, impaired sensation, and pain.   ACTIVITY LIMITATIONS: carrying, lifting, bending, sitting, standing, squatting, sleeping, stairs, transfers, and locomotion level  PARTICIPATION LIMITATIONS: cleaning, laundry, shopping, community activity, occupation, and yard work  PERSONAL FACTORS: Fitness, Past/current experiences, Time since onset of injury/illness/exacerbation, and 3+ comorbidities: Polymyalgia rheumatica; fibromyalgia; DM; peripheral neuropathies  are also affecting patient's functional outcome.   REHAB POTENTIAL: Good  CLINICAL DECISION MAKING: Evolving/moderate complexity  EVALUATION COMPLEXITY: Moderate   GOALS: Goals reviewed with patient? Yes  SHORT TERM GOALS: Target date: 12/11/22 Pt will improve right knee extension up to -5d Baseline:-10 Goal status: INITIAL  2.  Pt will report worst pain 3/10 or less with functional activity and less occurrence of sleep disturbance d/t pain.  Baseline: worst 4/10 Goal status: INITIAL  3.  Pt will be able to climb 2-3  stairs using safe pattern ue unsupported to demonstrate  improved safety entering/exiting home Baseline: step to 2 step with pain and freight (no rails at home) Goal status: INITIAL  4.  Pt will tolerate multiple sessions without needing multiple rest periods. Baseline: gets winded walking through home Goal status: INITIAL    LONG TERM GOALS: Target date: 01/01/23  Pt will improve FOTO functional score in order to demonstrate a statistically significant improvement in functional mobility and quality of life.  Baseline: 38% Goal status: INITIAL  2.  Pt will be independent with final HEP to improve their ability to self-manage symptoms  Baseline: no HEP Goal status: INITIAL  3.  Pt will be knowledgeable and will be able to teach back proper technique to rise from floor Baseline: unable/afraid Goal status: INITIAL  4.  RLE strength will be = LLE for improved functional mobility and to be prepared for surgical procedure Baseline: R weaker than L Goal status: INITIAL   PLAN:  PT FREQUENCY: 1-2x/week  PT DURATION:  7weeks sessions  PLANNED INTERVENTIONS: Therapeutic exercises, Therapeutic activity, Neuromuscular re-education, Balance training, Gait training, Patient/Family education, Self Care, Joint mobilization, Joint manipulation, Stair training, Orthotic/Fit training, DME instructions,  Aquatic Therapy, Dry Needling, Electrical stimulation, Spinal manipulation, Spinal mobilization, Cryotherapy, scar mobilization, Splintting, Taping, Traction, Ionotophoresis '4mg'$ /ml Dexamethasone, Manual therapy, and Re-evaluation  PLAN FOR NEXT SESSION: Aquatics and land based: LE strengthening and ROM/stretching; balance retraining (reactive), proprioceptive/kinesthetic retraining; stair climbing instruction; aerobic capacity retraining  Kerin Perna, PTA 11/23/22 4:48 PM [facility] [address] [place], [place], [address] Phone:  [phone]   Fax:  [phone]

## 2022-11-28 ENCOUNTER — Ambulatory Visit (HOSPITAL_BASED_OUTPATIENT_CLINIC_OR_DEPARTMENT_OTHER): Payer: BC Managed Care – PPO | Admitting: Physical Therapy

## 2022-11-28 ENCOUNTER — Encounter (HOSPITAL_BASED_OUTPATIENT_CLINIC_OR_DEPARTMENT_OTHER): Payer: Self-pay | Admitting: Physical Therapy

## 2022-11-28 DIAGNOSIS — G8929 Other chronic pain: Secondary | ICD-10-CM

## 2022-11-28 DIAGNOSIS — M6281 Muscle weakness (generalized): Secondary | ICD-10-CM

## 2022-11-28 DIAGNOSIS — R269 Unspecified abnormalities of gait and mobility: Secondary | ICD-10-CM

## 2022-11-28 DIAGNOSIS — M25562 Pain in left knee: Secondary | ICD-10-CM | POA: Diagnosis not present

## 2022-11-28 NOTE — Therapy (Signed)
OUTPATIENT PHYSICAL THERAPY LOWER EXTREMITY TREATMENT   Patient Name: Diana Herman MRN: [id number] DOB:01/11/1966, 57 y.o., female Today's Date: 11/28/2022  END OF SESSION:  PT End of Session - 11/28/22 1601     Visit Number 3    Number of Visits 14    Date for PT Re-Evaluation 01/01/23    Authorization Type [employer]    PT Start Time 1546    PT Stop Time 1630    PT Time Calculation (min) 44 min    Activity Tolerance Patient tolerated treatment well    Behavior During Therapy [facility] for tasks assessed/performed             Past Medical History:  Diagnosis Date   Achilles rupture, right 08/18/2018   Formatting of this note might be different from the original. 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   Severe sepsis with acute organ dysfunction ([place]) 10/15/2021   Sleep apnea    wears cpap    Past Surgical History:  Procedure Laterality Date   ACHILLES TENDON SURGERY Right    COLONOSCOPY  04/14/2021   [facility]   COLONOSCOPY WITH ESOPHAGOGASTRODUODENOSCOPY (EGD)  01/2022   HYSTERECTOMY     hysteroscopy and polypectomy     POLYPECTOMY     TONSILLECTOMY     WISDOM TOOTH EXTRACTION     early 90's    Patient Active Problem List   Diagnosis Date Noted   Depression with anxiety 10/15/2021   Nausea vomiting and diarrhea 08/13/2021   Type 2 diabetes mellitus without complication ([place])  55/73/2202   Hx of adenomatous colonic polyps 04/28/2021   Long term current use of systemic steroids 09/21/2020   Primary hypertension 08/30/2020   Morbid obesity ([place]) 01/01/2019   Polymyalgia rheumatica ([place]) 08/27/2017   Primary osteoarthritis of left knee 06/30/2016   Primary osteoarthritis of right knee 06/30/2016   Anxiety 01/17/2016   Impaired glucose tolerance 01/17/2016   Mixed hyperlipidemia 01/17/2016   OSA on CPAP 01/17/2016    PCP: [facility]   REFERRING PROVIDER: Donzetta Sprung., MD   REFERRING DIAG: Bilateral primary osteoarthritis of knee   THERAPY DIAG:  Chronic pain of left knee  Chronic pain of right knee  Muscle weakness (generalized)  Gait abnormality  Rationale for Evaluation and Treatment: Rehabilitation  ONSET DATE: 15 yrs  SUBJECTIVE:   SUBJECTIVE STATEMENT: I can't believe I was able to walk up the step last session without any knee pain    FROM EVAL: Not afraid of water; no access to pool at this time, maybe plan to gain access Knee arthritis originated from falls. 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 Basically sedentary, lost 30 lbs to get ready for TKR which is scheduled Jan 08, 2023. Fibromyalgia, Polymyalgia rheumatica ([place]). Pt reports peripheral neuropathy's, DM controlled all which complicate pain. She is a Automotive engineer and maintains good health, just isn't an exercise person     Past Medical History:  Diagnosis Date   Achilles rupture, right 08/18/2018   Formatting of this note might be different from the original. 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 She has been on steroids for a long time and has now weaned off of the steroids for several weeks now. Since coming off the steroids the knee pain that she has been having has worsened. She would like to consider doing a total knee replacement on her right knee. She does have a history of C. difficile colitis and has had sepsis associated with this in the past from antibiotics. " PAIN:  Are you having pain? Yes: NPRS scale: 4/10 Pain location: bilat knees  Pain description: sharp/ needle pain (no ache) takes breath  away Aggravating factors: any movement, walking, STS Relieving factors: sitting; ice  PRECAUTIONS: Knee  WEIGHT BEARING RESTRICTIONS: No  FALLS:  Has patient fallen in last 6 months? 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 there is a calcified loose body in the suprapatellar region of the left knee.   PATIENT SURVEYS:  FOTO 38% with goal of 50%  COGNITION: Overall cognitive status: Within functional limits for tasks assessed     SENSATION: Bilateral feet: pins and needled bottom of feet  EDEMA:  N/A  MUSCLE LENGTH: Hamstrings: Right 85 deg; Left 90 deg   POSTURE: No Significant postural limitations  PALPATION: Crepitus R>L  LOWER EXTREMITY ROM:  Active ROM Right eval Left eval  Hip flexion    Hip extension    Hip abduction    Hip adduction    Hip internal rotation    Hip external rotation    Knee flexion 120 130  Knee extension -10 0  Ankle dorsiflexion    Ankle plantarflexion    Ankle inversion    Ankle eversion     (Blank rows = not tested)  LOWER EXTREMITY MMT:  MMT Right eval Left eval  Hip flexion 4 5-  Hip extension 5 5  Hip abduction 5 5  Hip adduction 5 5  Hip internal rotation    Hip external rotation    Knee flexion 4 P! 4+ P!  Knee extension 5 5  Ankle dorsiflexion    Ankle plantarflexion    Ankle inversion    Ankle eversion     (Blank rows = not tested)  LOWER EXTREMITY  SPECIAL TESTS:  Knee special tests: Anterior drawer test: negative and Posterior drawer test: negative Thomas test: Right pos; Left neg  FUNCTIONAL TESTS:  5 times sit to stand: 17.07 Timed up and go (TUG): 12.55 SLS right 5s  Right 3s  GAIT: Distance walked: 400 Assistive device utilized: none Level of assistance: Complete Independence Comments: Antalgic right and left, decreased hip/knee flex; increased lateral displacement/hip circumduction   TODAY'S TREATMENT:                                                                                                                              Pt seen for aquatic therapy today.  Treatment took place in water 3.25-4.5 ft in depth at the [facility]. Temp of water was 91.  Pt entered/exited the pool via stairs facing backwards going in, forward going out with heavy UE on bilat rail.  * holding yellow noodle: forward/ backward gait, side stepping with varied step height                                       : high knee marching forward backward; small kicks forward * holding ue yellow noodle: heel/toe raises x 15; hip add/abd; hip extension; hip flex x 12reps.  Cues for SLS, abdominal bracing and balance control  *core strength and proprioception retraining: hand bells oscillation frontal and sagittal planes le staggered then wide stance ea 1-2 sets of 15  -Ue support yellow hand buoys le oscillations in sagittal plane (tolerated well) frontal plane (somre knee discomfort) 1 set of 15 fast one slow * straddling yellow noodle with yellow hand floats for support: cycling forward 2 laps    Pt requires the buoyancy and hydrostatic pressure of water for support, and to offload joints by unweighting joint load by at least 50 % in navel deep water and by at least 75-80% in chest to neck deep water.  Viscosity of the water is needed for resistance of strengthening. Water current perturbations provides challenge to standing balance requiring  increased core activation.      PATIENT EDUCATION:  Education details: aquatic progressions/ modifications  Person educated: Patient Education method: Explanation Education comprehension: verbalized understanding  HOME EXERCISE PROGRAM: To be assigned  ASSESSMENT:  CLINICAL IMPRESSION: Pt reports very good response from 1st aquatic session with decrease in pain x2 days. She is encouraged with result and is actively looking for a pool to have access. Worked strengthening added Psychologist, sport and exercise. She demonstrates improved stair climbing submerged. Was able to decrease ue support with exercises increasing core/balance challenges. Goals ongoing   OBJECTIVE IMPAIRMENTS: Abnormal gait, decreased activity tolerance, decreased balance, decreased endurance, decreased mobility, difficulty walking, decreased ROM, decreased strength, impaired sensation, and pain.   ACTIVITY LIMITATIONS: carrying, lifting, bending, sitting, standing, squatting, sleeping, stairs, transfers, and locomotion level  PARTICIPATION LIMITATIONS: cleaning, laundry, shopping, community activity, occupation, and yard work  PERSONAL FACTORS: Fitness, Past/current experiences, Time since onset of injury/illness/exacerbation, and 3+ comorbidities: Polymyalgia rheumatica; fibromyalgia; DM; peripheral neuropathies  are also affecting patient's functional outcome.   REHAB POTENTIAL: Good  CLINICAL DECISION MAKING: Evolving/moderate complexity  EVALUATION COMPLEXITY: Moderate   GOALS: Goals reviewed with patient? Yes  SHORT TERM GOALS: Target date: 12/11/22 Pt will improve right knee extension up to -5d Baseline:-10 Goal status: INITIAL  2.  Pt will report worst pain 3/10 or less with functional activity and less occurrence of sleep disturbance d/t pain.  Baseline: worst 4/10 Goal status: INITIAL  3.  Pt will be able to climb 2-3 stairs using safe pattern ue unsupported to demonstrate  improved safety  entering/exiting home Baseline: step to 2 step with pain and freight (no rails at home) Goal status: INITIAL  4.  Pt will tolerate multiple sessions without needing multiple rest periods. Baseline: gets winded walking through home Goal status: INITIAL    LONG TERM GOALS: Target date: 01/01/23  Pt will improve FOTO functional score in order to demonstrate a statistically significant improvement in functional mobility and quality of life.  Baseline: 38% Goal status: INITIAL  2.  Pt will be independent with final HEP to improve their ability to self-manage symptoms  Baseline: no HEP Goal status: INITIAL  3.  Pt will be knowledgeable and will be able to teach back proper technique to rise from floor Baseline: unable/afraid Goal status: INITIAL  4.  RLE strength will be = LLE for improved functional mobility and to be prepared for surgical procedure Baseline: R weaker than L Goal status: INITIAL   PLAN:  PT FREQUENCY: 1-2x/week  PT DURATION:  7weeks sessions  PLANNED INTERVENTIONS: Therapeutic exercises, Therapeutic activity, Neuromuscular re-education, Balance training, Gait training, Patient/Family education, Self Care, Joint mobilization, Joint manipulation, Stair training, Orthotic/Fit training, DME instructions, Aquatic Therapy, Dry Needling, Electrical stimulation,  Spinal manipulation, Spinal mobilization, Cryotherapy, scar mobilization, Splintting, Taping, Traction, Ionotophoresis '4mg'$ /ml Dexamethasone, Manual therapy, and Re-evaluation  PLAN FOR NEXT SESSION: Aquatics and land based: LE strengthening and ROM/stretching; balance retraining (reactive), proprioceptive/kinesthetic retraining; stair climbing instruction; aerobic capacity retraining  Diana Herman MPT 11/28/22 6:11 PM [facility] [address] [place], [place], [address] Phone: [phone]   Fax:  [phone]

## 2022-11-30 ENCOUNTER — Encounter (HOSPITAL_BASED_OUTPATIENT_CLINIC_OR_DEPARTMENT_OTHER): Payer: Self-pay

## 2022-11-30 ENCOUNTER — Ambulatory Visit (HOSPITAL_BASED_OUTPATIENT_CLINIC_OR_DEPARTMENT_OTHER): Payer: BC Managed Care – PPO | Admitting: Physical Therapy

## 2022-11-30 ENCOUNTER — Telehealth (HOSPITAL_BASED_OUTPATIENT_CLINIC_OR_DEPARTMENT_OTHER): Payer: Self-pay | Admitting: Physical Therapy

## 2022-11-30 NOTE — Telephone Encounter (Signed)
Patient did not show for Aquatic PT appt, but called and left message at time of appt.  Returned phone call to patient.  She reports she mistook the time of her appt and apologized.  Confirmed next upcoming appts with her.    Kerin Perna, PTA 11/30/22 5:17 PM [facility] [address] [place], [place], [address] Phone: [phone]   Fax:  [phone]

## 2022-12-04 ENCOUNTER — Ambulatory Visit (HOSPITAL_BASED_OUTPATIENT_CLINIC_OR_DEPARTMENT_OTHER): Payer: BC Managed Care – PPO | Admitting: Physical Therapy

## 2022-12-04 ENCOUNTER — Encounter (HOSPITAL_BASED_OUTPATIENT_CLINIC_OR_DEPARTMENT_OTHER): Payer: Self-pay | Admitting: Physical Therapy

## 2022-12-04 DIAGNOSIS — M25562 Pain in left knee: Secondary | ICD-10-CM | POA: Diagnosis not present

## 2022-12-04 DIAGNOSIS — G8929 Other chronic pain: Secondary | ICD-10-CM

## 2022-12-04 DIAGNOSIS — M6281 Muscle weakness (generalized): Secondary | ICD-10-CM

## 2022-12-04 DIAGNOSIS — R269 Unspecified abnormalities of gait and mobility: Secondary | ICD-10-CM

## 2022-12-04 NOTE — Therapy (Signed)
OUTPATIENT PHYSICAL THERAPY LOWER EXTREMITY TREATMENT   Patient Name: Diana Herman MRN: [id number] DOB:Apr 28, 1966, 57 y.o., female Today's Date: 12/04/2022  END OF SESSION:  PT End of Session - 12/04/22 1622     Visit Number 4    Number of Visits 14    Date for PT Re-Evaluation 01/01/23    Authorization Type BC[employer]    PT Start Time 1615    PT Stop Time 1655    PT Time Calculation (min) 40 min    Activity Tolerance Patient tolerated treatment well    Behavior During Therapy [facility] for tasks assessed/performed             Past Medical History:  Diagnosis Date   Achilles rupture, right 08/18/2018   Formatting of this note might be different from the original. 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   Severe sepsis with acute organ dysfunction ([place]) 10/15/2021   Sleep apnea    wears cpap    Past Surgical History:  Procedure Laterality Date   ACHILLES TENDON SURGERY Right    COLONOSCOPY  04/14/2021   [facility]   COLONOSCOPY WITH ESOPHAGOGASTRODUODENOSCOPY (EGD)  01/2022   HYSTERECTOMY     hysteroscopy and polypectomy     POLYPECTOMY     TONSILLECTOMY     WISDOM TOOTH EXTRACTION     early 90's    Patient Active Problem List   Diagnosis Date Noted   Depression with anxiety 10/15/2021   Nausea vomiting and diarrhea 08/13/2021   Type 2 diabetes mellitus without complication ([place])  67/89/3810   Hx of adenomatous colonic polyps 04/28/2021   Long term current use of systemic steroids 09/21/2020   Primary hypertension 08/30/2020   Morbid obesity ([place]) 01/01/2019   Polymyalgia rheumatica ([place]) 08/27/2017   Primary osteoarthritis of left knee 06/30/2016   Primary osteoarthritis of right knee 06/30/2016   Anxiety 01/17/2016   Impaired glucose tolerance 01/17/2016   Mixed hyperlipidemia 01/17/2016   OSA on CPAP 01/17/2016    PCP: [facility]   REFERRING PROVIDER: Donzetta Sprung., MD   REFERRING DIAG: Bilateral primary osteoarthritis of knee   THERAPY DIAG:  Chronic pain of left knee  Chronic pain of right knee  Muscle weakness (generalized)  Gait abnormality  Rationale for Evaluation and Treatment: Rehabilitation  ONSET DATE: 15 yrs  SUBJECTIVE:   SUBJECTIVE STATEMENT: "I was so sore, I had trouble walking to my car afterwards".   "Oddly enough I felt great the next morning".  (Regarding last aquatic session)     FROM EVAL: Not afraid of water; no access to pool at this time, maybe plan to gain access Knee arthritis originated from falls. 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 Basically sedentary, lost 30  lbs to get ready for TKR which is scheduled Jan 08, 2023. Fibromyalgia, Polymyalgia rheumatica ([place]). Pt reports peripheral neuropathy's, DM controlled all which complicate pain. She is a Automotive engineer and maintains good health, just isn't an exercise person     Past Medical History:  Diagnosis Date   Achilles rupture, right 08/18/2018   Formatting of this note might be different from the original. 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 She has been on steroids for a long time and has now weaned off of the steroids for several weeks now. Since coming off the steroids the knee pain that she has been having has worsened. She would like to consider doing a total knee replacement on her right knee. She does have a history of C. difficile colitis and has had sepsis associated with this in the past from antibiotics. " PAIN:  Are you having pain? Yes: NPRS scale: 2-3/10 Pain location: bilat ant knees   Pain description: sharp/ needle pain (no ache) takes breath away Aggravating factors: any movement, walking, STS Relieving factors: sitting; ice  PRECAUTIONS: Knee  WEIGHT BEARING RESTRICTIONS: No  FALLS:  Has patient fallen in last 6 months? 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 there is a calcified loose body in the suprapatellar region of the left knee.   PATIENT SURVEYS:  FOTO 38% with goal of 50%  COGNITION: Overall cognitive status: Within functional limits for tasks assessed     SENSATION: Bilateral feet: pins and needled bottom of feet  EDEMA:  N/A  MUSCLE LENGTH: Hamstrings: Right 85 deg; Left 90 deg   POSTURE: No Significant postural limitations  PALPATION: Crepitus R>L  LOWER EXTREMITY ROM:  Active ROM Right eval Left eval  Hip flexion    Hip extension    Hip abduction    Hip adduction    Hip internal rotation    Hip external rotation    Knee flexion 120 130  Knee extension -10 0  Ankle dorsiflexion    Ankle plantarflexion    Ankle inversion    Ankle eversion     (Blank rows = not tested)  LOWER EXTREMITY MMT:  MMT Right eval Left eval  Hip flexion 4 5-  Hip extension 5 5  Hip abduction 5 5  Hip adduction 5 5  Hip internal rotation    Hip external rotation    Knee flexion 4 P! 4+ P!  Knee extension 5 5  Ankle dorsiflexion    Ankle plantarflexion    Ankle inversion    Ankle  eversion     (Blank rows = not tested)  LOWER EXTREMITY SPECIAL TESTS:  Knee special tests: Anterior drawer test: negative and Posterior drawer test: negative Thomas test: Right pos; Left neg  FUNCTIONAL TESTS:  5 times sit to stand: 17.07 Timed up and go (TUG): 12.55 SLS right 5s  Right 3s  GAIT: Distance walked: 400 Assistive device utilized: none Level of assistance: Complete Independence Comments: Antalgic right and left, decreased hip/knee flex; increased lateral displacement/hip circumduction   TODAY'S TREATMENT:                                                                                                                              Pt seen for aquatic therapy today.  Treatment took place in water 3.25-4.5 ft in depth at the [facility]. Temp of water was 91.  Pt entered/exited the pool via stairs facing backwards going in, forward going out with heavy UE on bilat rail.  * without UE support: forward/ backward gait, side stepping with varied step height; marching forward/ backward  * holding wall:  heel / toe raises x 15 * holding yellow hand floats: hip abdct/ addct x 10 each;  leg swings in sagittal plane x 10;  Leg swings in frontal plane (crossing midline) x 10 * forward step ups x 10 each LE (cues for  controlled descent) * lateral step ups (with hip hinge) x 10 each, UE on rail * L/R quad stretch with ankle supported by solid noodle -20s x 2 each, forward lean for opp hamstring stretch * 3 way leg stretch with LE supported at ankle by noodle * SLS with noodle press with intermittent UE to steady  * straddling noodle (middle lane for depth) cycling  Pt requires the buoyancy and hydrostatic pressure of water for support, and to offload joints by unweighting joint load by at least 50 % in navel deep water and by at least 75-80% in chest to neck deep water.  Viscosity of the water is needed for resistance of strengthening. Water current perturbations provides  challenge to standing balance requiring increased core activation.  PATIENT EDUCATION:  Education details: aquatic progressions/ modifications  Person educated: Patient Education method: Explanation Education comprehension: verbalized understanding  HOME EXERCISE PROGRAM: To be assigned  ASSESSMENT:  CLINICAL IMPRESSION: Pt reports increase in bilat knee pain to 3/10 intermittently with stairs. Otherwise pain reduced to 1/10 during session. Pt was able to decrease UE support with exercises increasing core/balance challenges. Goals ongoing.    OBJECTIVE IMPAIRMENTS: Abnormal gait, decreased activity tolerance, decreased balance, decreased endurance, decreased mobility, difficulty walking, decreased ROM, decreased strength, impaired sensation, and pain.   ACTIVITY LIMITATIONS: carrying, lifting, bending, sitting, standing, squatting, sleeping, stairs, transfers, and locomotion level  PARTICIPATION LIMITATIONS: cleaning, laundry, shopping, community activity, occupation, and yard work  PERSONAL FACTORS: Fitness, Past/current experiences, Time since onset of injury/illness/exacerbation, and 3+ comorbidities: Polymyalgia rheumatica; fibromyalgia; DM; peripheral neuropathies  are also affecting patient's functional outcome.   REHAB POTENTIAL: Good  CLINICAL DECISION MAKING: Evolving/moderate complexity  EVALUATION COMPLEXITY: Moderate   GOALS: Goals reviewed with patient? Yes  SHORT TERM GOALS: Target date: 12/11/22 Pt will improve right knee extension up to -5d Baseline:-10 Goal status: INITIAL  2.  Pt will report worst pain 3/10 or less with functional activity and less occurrence of sleep disturbance d/t pain.  Baseline: worst 4/10 Goal status: INITIAL  3.  Pt will be able to climb 2-3 stairs using safe pattern ue unsupported to demonstrate  improved safety entering/exiting home Baseline: step to 2 step with pain and freight (no rails at home) Goal status: INITIAL  4.  Pt  will tolerate multiple sessions without needing multiple rest periods. Baseline: gets winded walking through home Goal status: INITIAL    LONG TERM GOALS: Target date: 01/01/23  Pt will improve FOTO functional score in order to demonstrate a statistically significant improvement in functional mobility and quality of life.  Baseline: 38% Goal status: INITIAL  2.  Pt will be independent with final HEP to improve their ability to self-manage symptoms  Baseline: no HEP Goal status: INITIAL  3.  Pt will be knowledgeable and will be able to teach back proper technique to rise from floor Baseline: unable/afraid Goal status: INITIAL  4.  RLE strength will be = LLE for improved functional mobility and to be prepared for surgical procedure Baseline: R weaker than L Goal status: INITIAL   PLAN:  PT FREQUENCY: 1-2x/week  PT DURATION:  7weeks sessions  PLANNED INTERVENTIONS: Therapeutic exercises, Therapeutic activity, Neuromuscular re-education, Balance training, Gait training, Patient/Family education, Self Care, Joint mobilization, Joint manipulation, Stair training, Orthotic/Fit training, DME instructions, Aquatic Therapy, Dry Needling, Electrical stimulation, Spinal manipulation, Spinal mobilization, Cryotherapy, scar mobilization, Splintting, Taping, Traction, Ionotophoresis '4mg'$ /ml Dexamethasone, Manual therapy, and Re-evaluation  PLAN FOR NEXT SESSION: Aquatics and land based: LE strengthening and ROM/stretching;  balance retraining (reactive), proprioceptive/kinesthetic retraining; stair climbing instruction; aerobic capacity retraining  Kerin Perna, PTA 12/04/22 4:57 PM [facility] [address] [place], [place], [address] Phone: [phone]   Fax:  [phone]

## 2022-12-07 ENCOUNTER — Ambulatory Visit (HOSPITAL_BASED_OUTPATIENT_CLINIC_OR_DEPARTMENT_OTHER): Payer: BC Managed Care – PPO | Admitting: Physical Therapy

## 2022-12-07 ENCOUNTER — Encounter (HOSPITAL_BASED_OUTPATIENT_CLINIC_OR_DEPARTMENT_OTHER): Payer: Self-pay | Admitting: Physical Therapy

## 2022-12-07 DIAGNOSIS — R269 Unspecified abnormalities of gait and mobility: Secondary | ICD-10-CM

## 2022-12-07 DIAGNOSIS — M25562 Pain in left knee: Secondary | ICD-10-CM | POA: Diagnosis not present

## 2022-12-07 DIAGNOSIS — G8929 Other chronic pain: Secondary | ICD-10-CM

## 2022-12-07 DIAGNOSIS — M6281 Muscle weakness (generalized): Secondary | ICD-10-CM

## 2022-12-07 NOTE — Therapy (Signed)
OUTPATIENT PHYSICAL THERAPY LOWER EXTREMITY TREATMENT   Patient Name: Diana Herman MRN: [id number] DOB:15-May-1966, 57 y.o., female Today's Date: 12/07/2022  END OF SESSION:  PT End of Session - 12/07/22 1612     Visit Number 5    Number of Visits 14    Date for PT Re-Evaluation 01/01/23    Authorization Type [employer]    PT Start Time 1605    PT Stop Time 1645    PT Time Calculation (min) 40 min    Activity Tolerance Patient tolerated treatment well    Behavior During Therapy WFL for tasks assessed/performed             Past Medical History:  Diagnosis Date   Achilles rupture, right 08/18/2018   Formatting of this note might be different from the original. 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   Severe sepsis with acute organ dysfunction ([place]) 10/15/2021   Sleep apnea    wears cpap    Past Surgical History:  Procedure Laterality Date   ACHILLES TENDON SURGERY Right    COLONOSCOPY  04/14/2021   [facility]   COLONOSCOPY WITH ESOPHAGOGASTRODUODENOSCOPY (EGD)  01/2022   HYSTERECTOMY     hysteroscopy and polypectomy     POLYPECTOMY     TONSILLECTOMY     WISDOM TOOTH EXTRACTION     early 90's    Patient Active Problem List   Diagnosis Date Noted   Depression with anxiety 10/15/2021   Nausea vomiting and diarrhea 08/13/2021   Type 2 diabetes mellitus without complication ([place])  93/23/5573   Hx of adenomatous colonic polyps 04/28/2021   Long term current use of systemic steroids 09/21/2020   Primary hypertension 08/30/2020   Morbid obesity ([place]) 01/01/2019   Polymyalgia rheumatica ([place]) 08/27/2017   Primary osteoarthritis of left knee 06/30/2016   Primary osteoarthritis of right knee 06/30/2016   Anxiety 01/17/2016   Impaired glucose tolerance 01/17/2016   Mixed hyperlipidemia 01/17/2016   OSA on CPAP 01/17/2016    PCP: [facility]   REFERRING PROVIDER: Donzetta Sprung., MD   REFERRING DIAG: Bilateral primary osteoarthritis of knee   THERAPY DIAG:  Chronic pain of left knee  Chronic pain of right knee  Muscle weakness (generalized)  Gait abnormality  Rationale for Evaluation and Treatment: Rehabilitation  ONSET DATE: 15 yrs  SUBJECTIVE:   SUBJECTIVE STATEMENT: "I had no pain walking in today!"     FROM EVAL: Not afraid of water; no access to pool at this time, maybe plan to gain access Knee arthritis originated from falls. 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 Basically sedentary, lost 30 lbs to get ready for TKR which is scheduled Jan 08, 2023. Fibromyalgia, Polymyalgia rheumatica ([place]). Pt reports peripheral neuropathy's,  DM controlled all which complicate pain. She is a Automotive engineer and maintains good health, just isn't an exercise person     Past Medical History:  Diagnosis Date   Achilles rupture, right 08/18/2018   Formatting of this note might be different from the original. Added automatically from request for surgery [id number]   AKI (acute kidney injury) ([place]) 08/13/2021   Allergy    Anemia    Anxiety    Cancer (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 She has been on steroids for a long time and has now weaned off of the steroids for several weeks now. Since coming off the steroids the knee pain that she has been having has worsened. She would like to consider doing a total knee replacement on her right knee. She does have a history of C. difficile colitis and has had sepsis associated with this in the past from antibiotics. " PAIN:  Are you having pain? Yes: NPRS scale: 2-3/10 Pain location: bilat ant knees  Pain description: sharp/ needle pain (no ache) takes breath away Aggravating factors: any movement,  walking, STS Relieving factors: sitting; ice  PRECAUTIONS: Knee  WEIGHT BEARING RESTRICTIONS: No  FALLS:  Has patient fallen in last 6 months? 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 there is a calcified loose body in the suprapatellar region of the left knee.   PATIENT SURVEYS:  FOTO 38% with goal of 50%  COGNITION: Overall cognitive status: Within functional limits for tasks assessed     SENSATION: Bilateral feet: pins and needled bottom of feet  EDEMA:  N/A  MUSCLE LENGTH: Hamstrings: Right 85 deg; Left 90 deg   POSTURE: No Significant postural limitations  PALPATION: Crepitus R>L  LOWER EXTREMITY ROM:  Active ROM Right eval Left eval  Hip flexion    Hip extension    Hip abduction    Hip adduction    Hip internal rotation    Hip external rotation    Knee flexion 120 130  Knee extension -10 0  Ankle dorsiflexion    Ankle plantarflexion    Ankle inversion    Ankle eversion     (Blank rows = not tested)  LOWER EXTREMITY MMT:  MMT Right eval Left eval  Hip flexion 4 5-  Hip extension 5 5  Hip abduction 5 5  Hip adduction 5 5  Hip internal rotation    Hip external rotation    Knee flexion 4 P! 4+ P!  Knee extension 5 5  Ankle dorsiflexion    Ankle plantarflexion    Ankle inversion    Ankle eversion     (Blank rows = not tested)  LOWER EXTREMITY SPECIAL TESTS:  Knee special tests: Anterior  drawer test: negative and Posterior drawer test: negative Thomas test: Right pos; Left neg  FUNCTIONAL TESTS:  5 times sit to stand: 17.07 Timed up and go (TUG): 12.55 SLS right 5s  Right 3s  GAIT: Distance walked: 400 Assistive device utilized: none Level of assistance: Complete Independence Comments: Antalgic right and left, decreased hip/knee flex; increased lateral displacement/hip circumduction   TODAY'S TREATMENT:                                                                                                                              Pt seen for aquatic therapy today.  Treatment took place in water 3.25-4.5 ft in depth at the [facility]. Temp of water was 91.  Pt entered/exited the pool via stairs facing backwards going in, forward going out with heavy UE on bilat rail.  * without UE support: forward/ backward gait, side stepping  * side step R/L with arm addct/abdct with rainbow hand floats -> progressed to side squat  * holding wall:  heel / toe raises x 15 * holding rainbow hand floats: leg swings in sagittal plane x 10;  Leg swings in frontal plane (crossing midline) x 10 * marching (limited height of knees) * lateral step ups @ 4 ft (straddling blue step) x 10 each;  forward step down/retro step up x  8 each LE * SLS without support x 10-15s * holding yellow noodle:  curtsy lunges x 10; single LE clams x 7-8 each * L/R quad stretch with ankle supported by solid noodle -20s x 2 each, forward lean for opp hamstring stretch * 3 way leg stretch with LE supported at ankle by noodle   Pt requires the buoyancy and hydrostatic pressure of water for support, and to offload joints by unweighting joint load by at least 50 % in navel deep water and by at least 75-80% in chest to neck deep water.  Viscosity of the water is needed for resistance of strengthening. Water current perturbations provides challenge to standing balance requiring increased core  activation.  PATIENT EDUCATION:  Education details: aquatic progressions/ modifications  Person educated: Patient Education method: Explanation Education comprehension: verbalized understanding  HOME EXERCISE PROGRAM: To be assigned  ASSESSMENT:  CLINICAL IMPRESSION: Positive response to aquatic therapy thus far, with reports of improved sleep and overall decrease in pain.  Pt able to complete lateral step ups in 4 ft without any knee pain;  retro step up increased pain at rep 8.  Pt was able to decrease UE support with exercises increasing core/balance challenges. Pt progressing well towards established goals.    OBJECTIVE IMPAIRMENTS: Abnormal gait, decreased activity tolerance, decreased balance, decreased endurance, decreased mobility, difficulty walking, decreased ROM, decreased strength, impaired sensation, and pain.   ACTIVITY LIMITATIONS: carrying, lifting, bending, sitting, standing, squatting, sleeping, stairs, transfers, and locomotion level  PARTICIPATION LIMITATIONS: cleaning, laundry, shopping, community activity, occupation, and yard work  PERSONAL FACTORS: Fitness, Past/current experiences, Time since onset of injury/illness/exacerbation, and 3+ comorbidities: Polymyalgia rheumatica; fibromyalgia; DM; peripheral neuropathies  are also affecting patient's functional outcome.   REHAB POTENTIAL: Good  CLINICAL DECISION MAKING: Evolving/moderate complexity  EVALUATION COMPLEXITY: Moderate   GOALS: Goals reviewed with patient? Yes  SHORT TERM GOALS: Target date: 12/11/22 Pt will improve right knee extension up to -5d Baseline:-10 Goal status: INITIAL  2.  Pt will report worst pain 3/10 or less with functional activity and less occurrence of sleep disturbance d/t pain.  Baseline: worst 4/10.  She sleeps better on nights she does aquatic therapy -  12/07/22 Goal status: Ongoing   3.  Pt will be able to climb 2-3 stairs using safe pattern UE unsupported to  demonstrate  improved safety entering/exiting home Baseline: step to 2 step with pain and freight (no rails at home) Goal status: INITIAL  4.  Pt will tolerate multiple sessions without needing multiple rest periods. Baseline: gets winded walking through home Goal status: INITIAL    LONG TERM GOALS: Target date: 01/01/23  Pt will improve FOTO functional score in order to demonstrate a statistically significant improvement in functional mobility and quality of life.  Baseline: 38% Goal status: INITIAL  2.  Pt will be independent with final HEP to improve their ability to self-manage symptoms  Baseline: no HEP Goal status: INITIAL  3.  Pt will be knowledgeable and will be able to teach back proper technique to rise from floor Baseline: unable/afraid Goal status: INITIAL  4.  RLE strength will be = LLE for improved functional mobility and to be prepared for surgical procedure Baseline: R weaker than L Goal status: INITIAL   PLAN:  PT FREQUENCY: 1-2x/week  PT DURATION:  7weeks sessions  PLANNED INTERVENTIONS: Therapeutic exercises, Therapeutic activity, Neuromuscular re-education, Balance training, Gait training, Patient/Family education, Self Care, Joint mobilization, Joint manipulation, Stair training, Orthotic/Fit training, DME instructions, Aquatic Therapy, Dry Needling,  Electrical stimulation, Spinal manipulation, Spinal mobilization, Cryotherapy, scar mobilization, Splintting, Taping, Traction, Ionotophoresis '4mg'$ /ml Dexamethasone, Manual therapy, and Re-evaluation  PLAN FOR NEXT SESSION: Aquatics and land based: LE strengthening and ROM/stretching; balance retraining (reactive), proprioceptive/kinesthetic retraining; stair climbing instruction; aerobic capacity retraining  Kerin Perna, PTA 12/07/22 4:41 PM [facility] [address] [place], [place], [address] Phone: [phone]   Fax:  [phone]

## 2022-12-11 ENCOUNTER — Ambulatory Visit (HOSPITAL_BASED_OUTPATIENT_CLINIC_OR_DEPARTMENT_OTHER): Payer: BC Managed Care – PPO | Admitting: Physical Therapy

## 2022-12-11 ENCOUNTER — Encounter (HOSPITAL_BASED_OUTPATIENT_CLINIC_OR_DEPARTMENT_OTHER): Payer: Self-pay | Admitting: Physical Therapy

## 2022-12-11 DIAGNOSIS — R269 Unspecified abnormalities of gait and mobility: Secondary | ICD-10-CM

## 2022-12-11 DIAGNOSIS — M6281 Muscle weakness (generalized): Secondary | ICD-10-CM

## 2022-12-11 DIAGNOSIS — M25562 Pain in left knee: Secondary | ICD-10-CM | POA: Diagnosis not present

## 2022-12-11 DIAGNOSIS — G8929 Other chronic pain: Secondary | ICD-10-CM

## 2022-12-11 NOTE — Therapy (Signed)
OUTPATIENT PHYSICAL THERAPY LOWER EXTREMITY TREATMENT   Patient Name: Diana Herman MRN: [id number] DOB:1966-03-16, 57 y.o., female Today's Date: 12/11/2022  END OF SESSION:  PT End of Session - 12/11/22 1758     Visit Number 6    Number of Visits 14    Date for PT Re-Evaluation 01/01/23    Authorization Type [employer]    PT Start Time 1622    PT Stop Time 1700    PT Time Calculation (min) 38 min    Activity Tolerance Patient tolerated treatment well    Behavior During Therapy WFL for tasks assessed/performed             Past Medical History:  Diagnosis Date   Achilles rupture, right 08/18/2018   Formatting of this note might be different from the original. 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   Severe sepsis with acute organ dysfunction ([place]) 10/15/2021   Sleep apnea    wears cpap    Past Surgical History:  Procedure Laterality Date   ACHILLES TENDON SURGERY Right    COLONOSCOPY  04/14/2021   [facility]   COLONOSCOPY WITH ESOPHAGOGASTRODUODENOSCOPY (EGD)  01/2022   HYSTERECTOMY     hysteroscopy and polypectomy     POLYPECTOMY     TONSILLECTOMY     WISDOM TOOTH EXTRACTION     early 90's    Patient Active Problem List   Diagnosis Date Noted   Depression with anxiety 10/15/2021   Nausea vomiting and diarrhea 08/13/2021   Type 2 diabetes mellitus without complication ([place])  69/48/5462   Hx of adenomatous colonic polyps 04/28/2021   Long term current use of systemic steroids 09/21/2020   Primary hypertension 08/30/2020   Morbid obesity ([place]) 01/01/2019   Polymyalgia rheumatica ([place]) 08/27/2017   Primary osteoarthritis of left knee 06/30/2016   Primary osteoarthritis of right knee 06/30/2016   Anxiety 01/17/2016   Impaired glucose tolerance 01/17/2016   Mixed hyperlipidemia 01/17/2016   OSA on CPAP 01/17/2016    PCP: [facility]   REFERRING PROVIDER: Donzetta Sprung., MD   REFERRING DIAG: Bilateral primary osteoarthritis of knee   THERAPY DIAG:  Chronic pain of left knee  Chronic pain of right knee  Muscle weakness (generalized)  Gait abnormality  Rationale for Evaluation and Treatment: Rehabilitation  ONSET DATE: 15 yrs  SUBJECTIVE:   SUBJECTIVE STATEMENT: "Pain is better"     FROM EVAL: Not afraid of water; no access to pool at this time, maybe plan to gain access Knee arthritis originated from falls. 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 Basically sedentary, lost 30 lbs to get ready for TKR which is scheduled Jan 08, 2023. Fibromyalgia, Polymyalgia rheumatica ([place]). Pt reports peripheral neuropathy's, DM controlled all which  complicate pain. She is a Automotive engineer and maintains good health, just isn't an exercise person     Past Medical History:  Diagnosis Date   Achilles rupture, right 08/18/2018   Formatting of this note might be different from the original. 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 She has been on steroids for a long time and has now weaned off of the steroids for several weeks now. Since coming off the steroids the knee pain that she has been having has worsened. She would like to consider doing a total knee replacement on her right knee. She does have a history of C. difficile colitis and has had sepsis associated with this in the past from antibiotics. " PAIN:  Are you having pain? Yes: NPRS scale: 2-3/10 Pain location: bilat ant knees  Pain description: sharp/ needle pain (no ache) takes breath away Aggravating factors: any movement, walking,  STS Relieving factors: sitting; ice  PRECAUTIONS: Knee  WEIGHT BEARING RESTRICTIONS: No  FALLS:  Has patient fallen in last 6 months? 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 there is a calcified loose body in the suprapatellar region of the left knee.   PATIENT SURVEYS:  FOTO 38% with goal of 50%  COGNITION: Overall cognitive status: Within functional limits for tasks assessed     SENSATION: Bilateral feet: pins and needled bottom of feet  EDEMA:  N/A  MUSCLE LENGTH: Hamstrings: Right 85 deg; Left 90 deg   POSTURE: No Significant postural limitations  PALPATION: Crepitus R>L  LOWER EXTREMITY ROM:  Active ROM Right eval Left eval Right  12/11/22  Hip flexion     Hip extension     Hip abduction     Hip adduction     Hip internal rotation     Hip external rotation     Knee flexion 120 130   Knee extension -10 0 -4  Ankle dorsiflexion     Ankle plantarflexion     Ankle inversion     Ankle eversion      (Blank rows = not tested)  LOWER EXTREMITY MMT:  MMT Right eval Left eval  Hip flexion 4 5-  Hip extension 5 5  Hip abduction 5 5  Hip adduction 5 5  Hip internal rotation    Hip external rotation    Knee flexion 4 P! 4+ P!  Knee extension 5 5  Ankle dorsiflexion    Ankle plantarflexion    Ankle inversion    Ankle eversion     (Blank rows = not tested)  LOWER EXTREMITY SPECIAL TESTS:  Knee  special tests: Anterior drawer test: negative and Posterior drawer test: negative Thomas test: Right pos; Left neg  FUNCTIONAL TESTS:  5 times sit to stand: 17.07 Timed up and go (TUG): 12.55 SLS right 5s  Right 3s  GAIT: Distance walked: 400 Assistive device utilized: none Level of assistance: Complete Independence Comments: Antalgic right and left, decreased hip/knee flex; increased lateral displacement/hip circumduction   TODAY'S TREATMENT:                                                                                                                              Pt seen for aquatic therapy today.  Treatment took place in water 3.25-4.5 ft in depth at the [facility]. Temp of water was 91.  Pt entered/exited the pool via stairs facing backwards going in, forward going out with heavy UE on bilat rail.  * without UE support: forward/ backward gait, side stepping  * side lunge R/L with arm addct/abdct with rainbow hand floats  * unsupported: heel / toe raises x 15, curtsy lunges x 10; single LE clams x 7-8 each * SLS without support x 10-15s * holding rainbow hand floats: leg swings in sagittal plane ;  Leg swings in frontal plane (crossing midline)multiple trials slow vs quick oscillations * toe and heel  walking x 4 widths ea forward and back *LAQ 2x 20 alternating LE cues for increased speed *cycling on noodle     Pt requires the buoyancy and hydrostatic pressure of water for support, and to offload joints by unweighting joint load by at least 50 % in navel deep water and by at least 75-80% in chest to neck deep water.  Viscosity of the water is needed for resistance of strengthening. Water current perturbations provides challenge to standing balance requiring increased core activation.  PATIENT EDUCATION:  Education details: aquatic progressions/ modifications  Person educated: Patient Education method: Explanation Education comprehension: verbalized  understanding  HOME EXERCISE PROGRAM: To be assigned  ASSESSMENT:  CLINICAL IMPRESSION: Pt reports max pain 3/10 past week. She has progressed with climbing stair in and out of pool using alternating pattern without fear ue slight support on hand rail.  She does not require rest periods throughout aquatic sessions demonstrating an improvement in toleration to activity and aerobic capacity. She continue with slight right knee extension lag but it is improved since last tested. She tolerates progression of therapy without difficulty nor pain.  She begins land based sessions next weekGoals ongoing    Positive response to aquatic therapy thus far, with reports of improved sleep and overall decrease in pain.  Pt able to complete lateral step ups in 4 ft without any knee pain;  retro step up increased pain at rep 8.  Pt was able to decrease UE support with exercises increasing core/balance challenges. Pt progressing well towards established goals.    OBJECTIVE IMPAIRMENTS: Abnormal gait, decreased activity tolerance, decreased balance, decreased endurance, decreased mobility, difficulty walking, decreased ROM, decreased strength, impaired sensation, and pain.   ACTIVITY LIMITATIONS: carrying, lifting, bending, sitting, standing, squatting, sleeping, stairs, transfers, and locomotion level  PARTICIPATION LIMITATIONS: cleaning, laundry, shopping, community activity, occupation, and yard work  PERSONAL FACTORS: Fitness, Past/current experiences, Time since onset of injury/illness/exacerbation, and 3+ comorbidities: Polymyalgia rheumatica; fibromyalgia; DM; peripheral neuropathies  are also affecting patient's functional outcome.   REHAB POTENTIAL: Good  CLINICAL DECISION MAKING: Evolving/moderate complexity  EVALUATION COMPLEXITY: Moderate   GOALS: Goals reviewed with patient? Yes  SHORT TERM GOALS: Target date: 12/11/22 Pt will improve right knee extension up to -5d Baseline:-10 Goal  status: ongoing  2.  Pt will report worst pain 3/10 or less with functional activity and less occurrence of sleep disturbance d/t pain.  Baseline: worst 4/10.  She sleeps better on nights she does aquatic therapy -  12/07/22 Goal status: Met 12/11/22  3.  Pt will be able to climb 2-3 stairs using safe pattern UE unsupported to demonstrate  improved safety entering/exiting home Baseline: step to 2 step with pain and freight (no rails at home) Goal status: Met 12/11/22  4.  Pt will tolerate multiple sessions without needing multiple rest periods. Baseline: gets winded walking through home Goal status: Met 12/11/22    LONG TERM GOALS: Target date: 01/01/23  Pt will improve FOTO functional score in order to demonstrate a statistically significant improvement in functional mobility and quality of life.  Baseline: 38% Goal status: INITIAL  2.  Pt will be independent with final HEP to improve their ability to self-manage symptoms  Baseline: no HEP Goal status: INITIAL  3.  Pt will be knowledgeable and will be able to teach back proper technique to rise from floor Baseline: unable/afraid Goal status: INITIAL  4.  RLE strength will be = LLE for improved functional mobility and to be prepared for surgical  procedure Baseline: R weaker than L Goal status: INITIAL   PLAN:  PT FREQUENCY: 1-2x/week  PT DURATION:  7weeks sessions  PLANNED INTERVENTIONS: Therapeutic exercises, Therapeutic activity, Neuromuscular re-education, Balance training, Gait training, Patient/Family education, Self Care, Joint mobilization, Joint manipulation, Stair training, Orthotic/Fit training, DME instructions, Aquatic Therapy, Dry Needling, Electrical stimulation, Spinal manipulation, Spinal mobilization, Cryotherapy, scar mobilization, Splintting, Taping, Traction, Ionotophoresis '4mg'$ /ml Dexamethasone, Manual therapy, and Re-evaluation  PLAN FOR NEXT SESSION: Aquatics and land based: LE strengthening and  ROM/stretching; balance retraining (reactive), proprioceptive/kinesthetic retraining; stair climbing instruction; aerobic capacity retraining  Stanton Kidney Tharon Aquas) Faustino Luecke MPT 12/11/22 426pm [facility] [address] [place], [place], [address] Phone: [phone]   Fax:  [phone]

## 2022-12-14 ENCOUNTER — Ambulatory Visit (HOSPITAL_BASED_OUTPATIENT_CLINIC_OR_DEPARTMENT_OTHER): Payer: BC Managed Care – PPO | Admitting: Physical Therapy

## 2022-12-14 ENCOUNTER — Encounter (HOSPITAL_BASED_OUTPATIENT_CLINIC_OR_DEPARTMENT_OTHER): Payer: Self-pay | Admitting: Physical Therapy

## 2022-12-14 DIAGNOSIS — M6281 Muscle weakness (generalized): Secondary | ICD-10-CM

## 2022-12-14 DIAGNOSIS — G8929 Other chronic pain: Secondary | ICD-10-CM

## 2022-12-14 DIAGNOSIS — R269 Unspecified abnormalities of gait and mobility: Secondary | ICD-10-CM

## 2022-12-14 DIAGNOSIS — M25562 Pain in left knee: Secondary | ICD-10-CM | POA: Diagnosis not present

## 2022-12-14 NOTE — Therapy (Signed)
OUTPATIENT PHYSICAL THERAPY LOWER EXTREMITY TREATMENT   Patient Name: Diana Herman MRN: [id number] DOB:1965-12-07, 57 y.o., female Today's Date: 12/14/2022  END OF SESSION:  PT End of Session - 12/14/22 1644     Visit Number 7    Number of Visits 14    Date for PT Re-Evaluation 01/01/23    Authorization Type [employer]    PT Start Time 1555    PT Stop Time 1640    PT Time Calculation (min) 45 min    Activity Tolerance Patient tolerated treatment well    Behavior During Therapy [facility] for tasks assessed/performed              Past Medical History:  Diagnosis Date   Achilles rupture, right 08/18/2018   Formatting of this note might be different from the original. 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   Severe sepsis with acute organ dysfunction ([place]) 10/15/2021   Sleep apnea    wears cpap    Past Surgical History:  Procedure Laterality Date   ACHILLES TENDON SURGERY Right    COLONOSCOPY  04/14/2021   [facility]   COLONOSCOPY WITH ESOPHAGOGASTRODUODENOSCOPY (EGD)  01/2022   HYSTERECTOMY     hysteroscopy and polypectomy     POLYPECTOMY     TONSILLECTOMY     WISDOM TOOTH EXTRACTION     early 90's    Patient Active Problem List   Diagnosis Date Noted   Depression with anxiety 10/15/2021   Nausea vomiting and diarrhea 08/13/2021   Type 2 diabetes mellitus without complication ([place])  93/26/7124   Hx of adenomatous colonic polyps 04/28/2021   Long term current use of systemic steroids 09/21/2020   Primary hypertension 08/30/2020   Morbid obesity ([place]) 01/01/2019   Polymyalgia rheumatica ([place]) 08/27/2017   Primary osteoarthritis of left knee 06/30/2016   Primary osteoarthritis of right knee 06/30/2016   Anxiety 01/17/2016   Impaired glucose tolerance 01/17/2016   Mixed hyperlipidemia 01/17/2016   OSA on CPAP 01/17/2016    PCP: [facility]   REFERRING PROVIDER: Donzetta Sprung., MD   REFERRING DIAG: Bilateral primary osteoarthritis of knee   THERAPY DIAG:  Chronic pain of left knee  Chronic pain of right knee  Muscle weakness (generalized)  Gait abnormality  Rationale for Evaluation and Treatment: Rehabilitation  ONSET DATE: 15 yrs  SUBJECTIVE:   SUBJECTIVE STATEMENT: Pt reports that she had relief for a full day after last aquatic session.  Pain returned today.      FROM EVAL: Not afraid of water; no access to pool at this time, maybe plan to gain access Knee arthritis originated from falls. 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 Basically sedentary, lost 30 lbs to get ready for TKR which  is scheduled Jan 08, 2023. Fibromyalgia, Polymyalgia rheumatica ([place]). Pt reports peripheral neuropathy's, DM controlled all which complicate pain. She is a Automotive engineer and maintains good health, just isn't an exercise person     Past Medical History:  Diagnosis Date   Achilles rupture, right 08/18/2018   Formatting of this note might be different from the original. 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 She has been on steroids for a long time and has now weaned off of the steroids for several weeks now. Since coming off the steroids the knee pain that she has been having has worsened. She would like to consider doing a total knee replacement on her right knee. She does have a history of C. difficile colitis and has had sepsis associated with this in the past from antibiotics. " PAIN:  Are you having pain? Yes: NPRS scale: 2/10 Pain location: bilat ant knees (R>L) Pain description: sharp/ needle pain (no  ache) takes breath away Aggravating factors: any movement, walking, STS Relieving factors: sitting; ice  PRECAUTIONS: Knee  WEIGHT BEARING RESTRICTIONS: No  FALLS:  Has patient fallen in last 6 months? 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 there is a calcified loose body in the suprapatellar region of the left knee.   PATIENT SURVEYS:  FOTO 38% with goal of 50%  COGNITION: Overall cognitive status: Within functional limits for tasks assessed     SENSATION: Bilateral feet: pins and needled bottom of feet  EDEMA:  N/A  MUSCLE LENGTH: Hamstrings: Right 85 deg; Left 90 deg   POSTURE: No Significant postural limitations  PALPATION: Crepitus R>L  LOWER EXTREMITY ROM:  Active ROM Right eval Left eval Right  12/11/22  Hip flexion     Hip extension     Hip abduction     Hip adduction     Hip internal rotation     Hip external rotation     Knee flexion 120 130   Knee extension -10 0 -4  Ankle dorsiflexion     Ankle plantarflexion     Ankle inversion     Ankle eversion      (Blank rows = not tested)  LOWER EXTREMITY MMT:  MMT Right eval Left eval  Hip flexion 4 5-  Hip extension 5 5  Hip abduction 5 5  Hip adduction 5 5  Hip internal rotation    Hip external rotation    Knee flexion 4 P! 4+ P!  Knee extension 5 5  Ankle dorsiflexion    Ankle plantarflexion    Ankle inversion    Ankle eversion      (Blank rows = not tested)  LOWER EXTREMITY SPECIAL TESTS:  Knee special tests: Anterior drawer test: negative and Posterior drawer test: negative Thomas test: Right pos; Left neg  FUNCTIONAL TESTS:  5 times sit to stand: 17.07 Timed up and go (TUG): 12.55 SLS right 5s  Right 3s  GAIT: Distance walked: 400 Assistive device utilized: none Level of assistance: Complete Independence Comments: Antalgic right and left, decreased hip/knee flex; increased lateral displacement/hip circumduction   TODAY'S TREATMENT:                                                                                                                              Pt seen for aquatic therapy today.  Treatment took place in water 3.25-4.5 ft in depth at the [facility]. Temp of water was 91.  Pt entered/exited the pool via stairs facing backwards going in, forward going out with heavy UE on bilat rail.  * without UE support: forward/ backward gait, side stepping, marching forward / backward  * monster walk forward backward;  LE circumduction forward/ backward  * side lunge R/L with arm addct/abdct with rainbow hand floats  * trialed split squats - "crunchy in knees" - only completed 2 each * unsupported: heel / toe raises x 15, curtsy  lunges x 10; single LE clams x 10 each; leg swings in sagittal plane * holding wall:  Leg swings in frontal plane (crossing midline) with increased speed x 10 each * sideways heel walking / toe walking  * front flutter kick, back float with flutter kick (cues for proper LE motion) * LAQ x 10 x 2  alternating LE cues for increased speed * STS at bench with feet on blue step, 4 sec eccentric lowering x 10 * quad stretch with foot supported by solid noodle   Pt requires the buoyancy and hydrostatic pressure of water for support, and to offload joints by unweighting joint load by at least 50 % in navel deep water and by at least 75-80% in chest to neck deep water.  Viscosity of  the water is needed for resistance of strengthening. Water current perturbations provides challenge to standing balance requiring increased core activation.  PATIENT EDUCATION:  Education details: aquatic progressions/ modifications  Person educated: Patient Education method: Explanation Education comprehension: verbalized understanding  HOME EXERCISE PROGRAM: To be assigned  ASSESSMENT:  CLINICAL IMPRESSION: Pt tolerates aquatic exercises well, with 0-0.5/10 pain throughout session.  Her balance has improved to where she no longer needs floatation device for support.  She continues with slight right knee extensor lag but it is improved since last tested. Pt begins land based therapy next week.  She is making great progress towards remaining goals.    OBJECTIVE IMPAIRMENTS: Abnormal gait, decreased activity tolerance, decreased balance, decreased endurance, decreased mobility, difficulty walking, decreased ROM, decreased strength, impaired sensation, and pain.   ACTIVITY LIMITATIONS: carrying, lifting, bending, sitting, standing, squatting, sleeping, stairs, transfers, and locomotion level  PARTICIPATION LIMITATIONS: cleaning, laundry, shopping, community activity, occupation, and yard work  PERSONAL FACTORS: Fitness, Past/current experiences, Time since onset of injury/illness/exacerbation, and 3+ comorbidities: Polymyalgia rheumatica; fibromyalgia; DM; peripheral neuropathies  are also affecting patient's functional outcome.   REHAB POTENTIAL: Good  CLINICAL DECISION MAKING: Evolving/moderate complexity  EVALUATION COMPLEXITY: Moderate   GOALS: Goals reviewed with patient? Yes  SHORT TERM GOALS: Target date: 12/11/22 Pt will improve right knee extension up to -5d Baseline:-10 Goal status: ongoing  2.  Pt will report worst pain 3/10 or less with functional activity and less occurrence of sleep disturbance d/t pain.  Baseline: worst 4/10.  She sleeps better on nights she does  aquatic therapy -  12/07/22 Goal status: Met 12/11/22  3.  Pt will be able to climb 2-3 stairs using safe pattern UE unsupported to demonstrate  improved safety entering/exiting home Baseline: step to 2 step with pain and freight (no rails at home) Goal status: Met 12/11/22  4.  Pt will tolerate multiple sessions without needing multiple rest periods. Baseline: gets winded walking through home Goal status: Met 12/11/22    LONG TERM GOALS: Target date: 01/01/23  Pt will improve FOTO functional score in order to demonstrate a statistically significant improvement in functional mobility and quality of life.  Baseline: 38% Goal status: INITIAL  2.  Pt will be independent with final HEP to improve their ability to self-manage symptoms  Baseline: no HEP Goal status: INITIAL  3.  Pt will be knowledgeable and will be able to teach back proper technique to rise from floor Baseline: unable/afraid Goal status: INITIAL  4.  RLE strength will be = LLE for improved functional mobility and to be prepared for surgical procedure Baseline: R weaker than L Goal status: INITIAL   PLAN:  PT FREQUENCY: 1-2x/week  PT DURATION:  7weeks sessions  PLANNED INTERVENTIONS: Therapeutic exercises, Therapeutic activity, Neuromuscular re-education, Balance training, Gait training, Patient/Family education, Self Care, Joint mobilization, Joint manipulation, Stair training, Orthotic/Fit training, DME instructions, Aquatic Therapy, Dry Needling, Electrical stimulation, Spinal manipulation, Spinal mobilization, Cryotherapy, scar mobilization, Splintting, Taping, Traction, Ionotophoresis '4mg'$ /ml Dexamethasone, Manual therapy, and Re-evaluation  PLAN FOR NEXT SESSION: Aquatics and land based: LE strengthening and ROM/stretching; balance retraining (reactive), proprioceptive/kinesthetic retraining; stair climbing instruction; aerobic capacity retraining Kerin Perna, PTA 12/14/22 4:52 PM [facility] [address] [place], [place], [address] Phone: [phone]   Fax:  [phone]

## 2022-12-18 ENCOUNTER — Ambulatory Visit (HOSPITAL_BASED_OUTPATIENT_CLINIC_OR_DEPARTMENT_OTHER): Payer: BC Managed Care – PPO | Admitting: Physical Therapy

## 2022-12-18 ENCOUNTER — Encounter (HOSPITAL_BASED_OUTPATIENT_CLINIC_OR_DEPARTMENT_OTHER): Payer: Self-pay | Admitting: Physical Therapy

## 2022-12-18 DIAGNOSIS — G8929 Other chronic pain: Secondary | ICD-10-CM

## 2022-12-18 DIAGNOSIS — M25562 Pain in left knee: Secondary | ICD-10-CM | POA: Diagnosis not present

## 2022-12-18 DIAGNOSIS — M6281 Muscle weakness (generalized): Secondary | ICD-10-CM

## 2022-12-18 DIAGNOSIS — R269 Unspecified abnormalities of gait and mobility: Secondary | ICD-10-CM

## 2022-12-18 NOTE — Therapy (Signed)
OUTPATIENT PHYSICAL THERAPY LOWER EXTREMITY TREATMENT   Patient Name: Diana Herman MRN: [id number] DOB:[birth year], 57 y.o., female Today's Date: 12/18/2022  END OF SESSION:  PT End of Session - 12/18/22 1014     Visit Number 8    Number of Visits 14    Date for PT Re-Evaluation 01/01/23    Authorization Type [employer]    PT Start Time 1015    PT Stop Time 1058    PT Time Calculation (min) 43 min    Activity Tolerance Patient tolerated treatment well    Behavior During Therapy WFL for tasks assessed/performed              Past Medical History:  Diagnosis Date   Achilles rupture, right 08/18/2018   Formatting of this note might be different from the original. 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   Severe sepsis with acute organ dysfunction ([place]) 10/15/2021   Sleep apnea    wears cpap    Past Surgical History:  Procedure Laterality Date   ACHILLES TENDON SURGERY Right    COLONOSCOPY  04/14/2021   [facility]   COLONOSCOPY WITH ESOPHAGOGASTRODUODENOSCOPY (EGD)  01/2022   HYSTERECTOMY     hysteroscopy and polypectomy     POLYPECTOMY     TONSILLECTOMY     WISDOM TOOTH EXTRACTION     early 90's    Patient Active Problem List   Diagnosis Date Noted   Depression with anxiety 10/15/2021   Nausea vomiting and diarrhea 08/13/2021   Type 2 diabetes mellitus without complication ([place])  67/67/2094   Hx of adenomatous colonic polyps 04/28/2021   Long term current use of systemic steroids 09/21/2020   Primary hypertension 08/30/2020   Morbid obesity ([place]) 01/01/2019   Polymyalgia rheumatica ([place]) 08/27/2017   Primary osteoarthritis of left knee 06/30/2016   Primary osteoarthritis of right knee 06/30/2016   Anxiety 01/17/2016   Impaired glucose tolerance 01/17/2016   Mixed hyperlipidemia 01/17/2016   OSA on CPAP 01/17/2016    PCP: [facility]   REFERRING PROVIDER: Donzetta Sprung., MD   REFERRING DIAG: Bilateral primary osteoarthritis of knee   THERAPY DIAG:  Chronic pain of left knee  Chronic pain of right knee  Muscle weakness (generalized)  Gait abnormality  Rationale for Evaluation and Treatment: Rehabilitation  ONSET DATE: 15 yrs  SUBJECTIVE:   SUBJECTIVE STATEMENT: Pt reports that she had relief for a full day after last aquatic session.  Pain returned today.      FROM EVAL: Not afraid of water; no access to pool at this time, maybe plan to gain access Knee arthritis originated from falls. 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 Basically sedentary, lost 30 lbs to get ready for TKR which  is scheduled Jan 08, 2023. Fibromyalgia, Polymyalgia rheumatica ([place]). Pt reports peripheral neuropathy's, DM controlled all which complicate pain. She is a Automotive engineer and maintains good health, just isn't an exercise person     Past Medical History:  Diagnosis Date   Achilles rupture, right 08/18/2018   Formatting of this note might be different from the original. 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 She has been on steroids for a long time and has now weaned off of the steroids for several weeks now. Since coming off the steroids the knee pain that she has been having has worsened. She would like to consider doing a total knee replacement on her right knee. She does have a history of C. difficile colitis and has had sepsis associated with this in the past from antibiotics. " PAIN:  Are you having pain? Yes: NPRS scale: 2/10 Pain location: bilat ant knees (R>L) Pain description: sharp/ needle pain (no  ache) takes breath away Aggravating factors: any movement, walking, STS Relieving factors: sitting; ice  PRECAUTIONS: Knee  WEIGHT BEARING RESTRICTIONS: No  FALLS:  Has patient fallen in last 6 months? 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 there is a calcified loose body in the suprapatellar region of the left knee.   PATIENT SURVEYS:  FOTO 38% with goal of 50%  COGNITION: Overall cognitive status: Within functional limits for tasks assessed     SENSATION: Bilateral feet: pins and needled bottom of feet  EDEMA:  N/A  MUSCLE LENGTH: Hamstrings: Right 85 deg; Left 90 deg   POSTURE: No Significant postural limitations  PALPATION: Crepitus R>L  LOWER EXTREMITY ROM:  Active ROM Right eval Left eval Right  12/11/22  Hip flexion     Hip extension     Hip abduction     Hip adduction     Hip internal rotation     Hip external rotation     Knee flexion 120 130   Knee extension -10 0 -4  Ankle dorsiflexion     Ankle plantarflexion     Ankle inversion     Ankle eversion      (Blank rows = not tested)  LOWER EXTREMITY MMT:  MMT Right eval Left eval  Hip flexion 4 5-  Hip extension 5 5  Hip abduction 5 5  Hip adduction 5 5  Hip internal rotation    Hip external rotation    Knee flexion 4 P! 4+ P!  Knee extension 5 5  Ankle dorsiflexion    Ankle plantarflexion    Ankle inversion    Ankle eversion      (Blank rows = not tested)  LOWER EXTREMITY SPECIAL TESTS:  Knee special tests: Anterior drawer test: negative and Posterior drawer test: negative Thomas test: Right pos; Left neg  FUNCTIONAL TESTS:  5 times sit to stand: 17.07 Timed up and go (TUG): 12.55 SLS right 5s  Right 3s  GAIT: Distance walked: 400 Assistive device utilized: none Level of assistance: Complete [place] Comments: Antalgic right and left, decreased hip/knee flex; increased lateral displacement/hip circumduction   TODAY'S TREATMENT:                                                                                                                              1/30 Reviewed use of exercise bike  Quad set x20 each leg  SLR 2x10 each leg  Hip abduction x20 red  Bridge 2x10   Standing heel raise x20  Standing low march 2x7     Reviewed things to think about prior to surgery      Last visit   Pt seen for aquatic therapy today.  Treatment took place in water 3.25-4.5 ft in depth at the [facility]. Temp of water was 91.  Pt entered/exited the pool via stairs facing backwards going in, forward going out with heavy UE on bilat rail.  * without  UE support: forward/ backward gait, side stepping, marching forward / backward  * monster walk forward backward;  LE circumduction forward/ backward  * side lunge R/L with arm addct/abdct with rainbow hand floats  * trialed split squats - "crunchy in knees" - only completed 2 each * unsupported: heel / toe raises x 15, curtsy lunges x 10; single LE clams x 10 each; leg swings in sagittal plane * holding wall:  Leg swings in frontal plane (crossing midline) with increased speed x 10 each * sideways heel walking / toe walking  * front flutter kick, back float with flutter kick (cues for proper LE motion) * LAQ x 10 x 2  alternating LE cues for increased speed * STS at bench with feet on blue step, 4 sec eccentric lowering x 10 * quad stretch with foot  supported by solid noodle   Pt requires the buoyancy and hydrostatic pressure of water for support, and to offload joints by unweighting joint load by at least 50 % in navel deep water and by at least 75-80% in chest to neck deep water.  Viscosity of the water is needed for resistance of strengthening. Water current perturbations provides challenge to standing balance requiring increased core activation.  PATIENT EDUCATION:  Education details: aquatic progressions/ modifications  Person educated: Patient Education method: Explanation Education comprehension: verbalized understanding  HOME EXERCISE PROGRAM: To be assigned  ASSESSMENT:  CLINICAL IMPRESSION: The patient did well with her first land visit. She had no significant pain in her knee. We gave her an updated HEP. We reviewed Standing and a supine series of exercises. We also reviewed how to set up the bike. We will continue to review exercises leading up to her treatment.   OBJECTIVE IMPAIRMENTS: Abnormal gait, decreased activity tolerance, decreased balance, decreased endurance, decreased mobility, difficulty walking, decreased ROM, decreased strength, impaired sensation, and pain.   ACTIVITY LIMITATIONS: carrying, lifting, bending, sitting, standing, squatting, sleeping, stairs, transfers, and locomotion level  PARTICIPATION LIMITATIONS: cleaning, laundry, shopping, community activity, occupation, and yard work  PERSONAL FACTORS: Fitness, Past/current experiences, Time since onset of injury/illness/exacerbation, and 3+ comorbidities: Polymyalgia rheumatica; fibromyalgia; DM; peripheral neuropathies  are also affecting patient's functional outcome.   REHAB POTENTIAL: Good  CLINICAL DECISION MAKING: Evolving/moderate complexity  EVALUATION COMPLEXITY: Moderate   GOALS: Goals reviewed with patient? Yes  SHORT TERM GOALS: Target date: 12/11/22 Pt will improve right knee extension up to -5d Baseline:-10 Goal status:  ongoing  2.  Pt will report worst pain 3/10 or less with functional activity and less occurrence of sleep disturbance d/t pain.  Baseline: worst 4/10.  She sleeps better on nights she does aquatic therapy -  12/07/22 Goal status: Met 12/11/22  3.  Pt will be able to climb 2-3 stairs using safe pattern UE unsupported to demonstrate  improved safety entering/exiting home Baseline: step to 2 step with pain and freight (no rails at home) Goal status: Met 12/11/22  4.  Pt will tolerate multiple sessions without needing multiple rest periods. Baseline: gets winded walking through home Goal status: Met 12/11/22    LONG TERM GOALS: Target date: 01/01/23  Pt will improve FOTO functional score in order to demonstrate a statistically significant improvement in functional mobility and quality of life.  Baseline: 38% Goal status: INITIAL  2.  Pt will be independent with final HEP to improve their ability to self-manage symptoms  Baseline: no HEP Goal status: INITIAL  3.  Pt will be knowledgeable and will be able to teach  back proper technique to rise from floor Baseline: unable/afraid Goal status: INITIAL  4.  RLE strength will be = LLE for improved functional mobility and to be prepared for surgical procedure Baseline: R weaker than L Goal status: INITIAL   PLAN:  PT FREQUENCY: 1-2x/week  PT DURATION:  7weeks sessions  PLANNED INTERVENTIONS: Therapeutic exercises, Therapeutic activity, Neuromuscular re-education, Balance training, Gait training, Patient/Family education, Self Care, Joint mobilization, Joint manipulation, Stair training, Orthotic/Fit training, DME instructions, Aquatic Therapy, Dry Needling, Electrical stimulation, Spinal manipulation, Spinal mobilization, Cryotherapy, scar mobilization, Splintting, Taping, Traction, Ionotophoresis '4mg'$ /ml Dexamethasone, Manual therapy, and Re-evaluation  PLAN FOR NEXT SESSION: Aquatics and land based: LE strengthening and ROM/stretching;  balance retraining (reactive), proprioceptive/kinesthetic retraining; stair climbing instruction; aerobic capacity retraining Kerin Perna, PTA 12/18/22 10:20 AM [facility] [address] [place], [place], [address] Phone: [phone]   Fax:  [phone]

## 2022-12-21 ENCOUNTER — Ambulatory Visit (HOSPITAL_BASED_OUTPATIENT_CLINIC_OR_DEPARTMENT_OTHER): Payer: BC Managed Care – PPO | Admitting: Physical Therapy

## 2022-12-21 ENCOUNTER — Encounter (HOSPITAL_BASED_OUTPATIENT_CLINIC_OR_DEPARTMENT_OTHER): Payer: Self-pay

## 2022-12-25 ENCOUNTER — Encounter (HOSPITAL_BASED_OUTPATIENT_CLINIC_OR_DEPARTMENT_OTHER): Payer: BC Managed Care – PPO | Admitting: Physical Therapy

## 2022-12-25 ENCOUNTER — Encounter (HOSPITAL_BASED_OUTPATIENT_CLINIC_OR_DEPARTMENT_OTHER): Payer: Self-pay | Admitting: Physical Therapy

## 2022-12-25 ENCOUNTER — Ambulatory Visit (HOSPITAL_BASED_OUTPATIENT_CLINIC_OR_DEPARTMENT_OTHER): Payer: BC Managed Care – PPO | Attending: Sports Medicine | Admitting: Physical Therapy

## 2022-12-25 DIAGNOSIS — G8929 Other chronic pain: Secondary | ICD-10-CM | POA: Diagnosis present

## 2022-12-25 DIAGNOSIS — M25562 Pain in left knee: Secondary | ICD-10-CM | POA: Diagnosis present

## 2022-12-25 DIAGNOSIS — M25661 Stiffness of right knee, not elsewhere classified: Secondary | ICD-10-CM | POA: Diagnosis present

## 2022-12-25 DIAGNOSIS — M6281 Muscle weakness (generalized): Secondary | ICD-10-CM | POA: Diagnosis present

## 2022-12-25 DIAGNOSIS — R600 Localized edema: Secondary | ICD-10-CM | POA: Diagnosis present

## 2022-12-25 DIAGNOSIS — R269 Unspecified abnormalities of gait and mobility: Secondary | ICD-10-CM | POA: Diagnosis present

## 2022-12-25 DIAGNOSIS — R2689 Other abnormalities of gait and mobility: Secondary | ICD-10-CM | POA: Diagnosis present

## 2022-12-25 DIAGNOSIS — M25561 Pain in right knee: Secondary | ICD-10-CM | POA: Diagnosis present

## 2022-12-25 NOTE — Therapy (Signed)
OUTPATIENT PHYSICAL THERAPY LOWER EXTREMITY TREATMENT   Patient Name: Diana Herman MRN: [id number] DOB:[birth year], 57 y.o., female Today's Date: 12/25/2022  END OF SESSION:  PT End of Session - 12/25/22 1204     Visit Number 9    Number of Visits 14    Date for PT Re-Evaluation 01/01/23    Authorization Type [employer]    PT Start Time 1202    PT Stop Time 0623    PT Time Calculation (min) 39 min    Behavior During Therapy [facility] for tasks assessed/performed              Past Medical History:  Diagnosis Date   Achilles rupture, right 08/18/2018   Formatting of this note might be different from the original. 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   Severe sepsis with acute organ dysfunction ([place]) 10/15/2021   Sleep apnea    wears cpap    Past Surgical History:  Procedure Laterality Date   ACHILLES TENDON SURGERY Right    COLONOSCOPY  04/14/2021   [facility]   COLONOSCOPY WITH ESOPHAGOGASTRODUODENOSCOPY (EGD)  01/2022   HYSTERECTOMY     hysteroscopy and polypectomy     POLYPECTOMY     TONSILLECTOMY     WISDOM TOOTH EXTRACTION     early 90's    Patient Active Problem List   Diagnosis Date Noted   Depression with anxiety 10/15/2021   Nausea vomiting and diarrhea 08/13/2021   Type 2 diabetes mellitus without complication ([place]) 17/61/6073   Hx of adenomatous colonic polyps 04/28/2021    Long term current use of systemic steroids 09/21/2020   Primary hypertension 08/30/2020   Morbid obesity ([place]) 01/01/2019   Polymyalgia rheumatica ([place]) 08/27/2017   Primary osteoarthritis of left knee 06/30/2016   Primary osteoarthritis of right knee 06/30/2016   Anxiety 01/17/2016   Impaired glucose tolerance 01/17/2016   Mixed hyperlipidemia 01/17/2016   OSA on CPAP 01/17/2016    PCP: [facility]   REFERRING PROVIDER: Donzetta Sprung., MD   REFERRING DIAG: Bilateral primary osteoarthritis of knee   THERAPY DIAG:  Chronic pain of left knee  Chronic pain of right knee  Muscle weakness (generalized)  Gait abnormality  Rationale for Evaluation and Treatment: Rehabilitation  ONSET DATE: 15 yrs  SUBJECTIVE:   SUBJECTIVE STATEMENT: "Today is not a good day.  I twisted my back getting out of bed on Friday."      FROM EVAL: Not afraid of water; no access to pool at this time, maybe plan to gain access Knee arthritis originated from falls. 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 Basically sedentary, lost 30 lbs to get ready for TKR which is scheduled Jan 08, 2023. Fibromyalgia, Polymyalgia rheumatica ([place]). Pt  reports peripheral neuropathy's, DM controlled all which complicate pain. She is a Automotive engineer and maintains good health, just isn't an exercise person     Past Medical History:  Diagnosis Date   Achilles rupture, right 08/18/2018   Formatting of this note might be different from the original. 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 She has been on steroids for a long time and has now weaned off of the steroids for several weeks now. Since coming off the steroids the knee pain that she has been having has worsened. She would like to consider doing a total knee replacement on her right knee. She does have a history of C. difficile colitis and has had sepsis associated with this in the past from antibiotics. " PAIN:  Are you having pain? Yes: NPRS scale: 2-3/10 Pain location: generalized "everywhere"  Pain description: achy  Aggravating factors: any movement, walking, STS Relieving factors: sitting;  ice  PRECAUTIONS: Knee  WEIGHT BEARING RESTRICTIONS: No  FALLS:  Has patient fallen in last 6 months? 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 there is a calcified loose body in the suprapatellar region of the left knee.   PATIENT SURVEYS:  FOTO 38% with goal of 50%  COGNITION: Overall cognitive status: Within functional limits for tasks assessed     SENSATION: Bilateral feet: pins and needled bottom of feet  EDEMA:  N/A  MUSCLE LENGTH: Hamstrings: Right 85 deg; Left 90 deg   POSTURE: No Significant postural limitations  PALPATION: Crepitus R>L  LOWER EXTREMITY ROM:  Active ROM Right eval Left eval Right  12/11/22  Hip flexion     Hip extension     Hip abduction     Hip adduction     Hip internal rotation     Hip external rotation     Knee flexion 120 130   Knee extension -10 0 -4  Ankle dorsiflexion     Ankle plantarflexion     Ankle inversion     Ankle eversion      (Blank rows = not tested)  LOWER EXTREMITY MMT:  MMT Right eval Left eval  Hip flexion 4 5-  Hip extension 5 5  Hip abduction 5 5  Hip adduction 5 5  Hip internal rotation    Hip external rotation    Knee flexion 4 P! 4+ P!  Knee extension 5 5  Ankle dorsiflexion    Ankle plantarflexion    Ankle inversion    Ankle eversion     (Blank rows = not tested)  LOWER EXTREMITY SPECIAL TESTS:  Knee special tests: Anterior drawer test:  negative and Posterior drawer test: negative Thomas test: Right pos; Left neg  FUNCTIONAL TESTS:  5 times sit to stand: 17.07 Timed up and go (TUG): 12.55 SLS right 5s  Right 3s  GAIT: Distance walked: 400 Assistive device utilized: none Level of assistance: Complete [place] Comments: Antalgic right and left, decreased hip/knee flex; increased lateral displacement/hip circumduction   TODAY'S TREATMENT:                                                                                                                              12/25/22 Pt seen for aquatic therapy today.  Treatment took place in water 3.25-4.5 ft in depth at the [facility]. Temp of water was 91.  Pt entered/exited the pool via stairs with reciprocal pattern and UE on rails.  * without UE support: backward gait, side stepping, marching forward / backward  * at stairs:  forward step up/ retro x 10 each LE with bilat UE on rail;  lateral step ups x 10 RLE * holding yellow hand floats:  heel raises x 20; leg swings into hip flexion/ext x 10 each; hip abdct/addct x 10 * STS with feet on blue step with 4 sec slow eccentric lowering x 12 * LE circumduction forward/backward;  monster walk forward/ backward  * near wall:  noodle stomp (slow x 10, quick x 5) with solid noodle each LE * 3 way LE stretch (ITBm, adductor, hamstring ) x 15 sec each * quad/hip flexor stretch with foot on solid noodle  Pt requires the buoyancy and hydrostatic pressure of water for support, and to offload joints by unweighting joint load by at least 50 % in navel deep water and by at least 75-80% in chest to neck deep water.  Viscosity of the water is needed for resistance of strengthening. Water current perturbations provides challenge to standing balance requiring increased core activation.    1/30 Reviewed use of exercise bike  Quad set x20 each leg  SLR 2x10 each leg  Hip abduction x20 red  Bridge 2x10  Standing heel raise  x20  Standing low march 2x7  Reviewed things to think about prior to surgery   PATIENT EDUCATION:  Education details: aquatic progressions/ modifications  Person educated: Patient Education method: Explanation Education comprehension: verbalized understanding  HOME EXERCISE PROGRAM: To be assigned  ASSESSMENT:  CLINICAL IMPRESSION: Pt had difficulty tolerating Lt step ups initially, but with corrected lower leg to neutral (vs valgus), pain improved.  Pt reported elimination of generalized pain after completing exercises in the water.  She has one last visit prior to surgery for TKA on Rt knee.  Pt to assess LTGs next visit.     OBJECTIVE IMPAIRMENTS: Abnormal gait, decreased activity tolerance, decreased balance, decreased endurance, decreased mobility, difficulty walking, decreased ROM, decreased strength, impaired sensation, and pain.   ACTIVITY LIMITATIONS: carrying, lifting, bending, sitting, standing, squatting, sleeping, stairs, transfers, and locomotion level  PARTICIPATION LIMITATIONS: cleaning, laundry, shopping, community activity, occupation, and yard work  PERSONAL FACTORS: Fitness, Past/current experiences, Time since onset of injury/illness/exacerbation, and 3+ comorbidities: Polymyalgia rheumatica; fibromyalgia; DM; peripheral neuropathies  are also affecting patient's functional outcome.   REHAB POTENTIAL: Good  CLINICAL DECISION MAKING: Evolving/moderate complexity  EVALUATION COMPLEXITY: Moderate   GOALS: Goals reviewed with patient? Yes  SHORT TERM GOALS: Target date: 12/11/22 Pt will improve right knee extension up to -5d Baseline:-10 Goal status: ongoing  2.  Pt will report worst pain 3/10 or less with functional activity and less occurrence of sleep disturbance d/t pain.  Baseline: worst 4/10.  She sleeps better on nights she does aquatic therapy -  12/07/22 Goal status: Met 12/11/22  3.  Pt will be able to climb 2-3 stairs using safe pattern UE  unsupported to demonstrate  improved safety entering/exiting home Baseline: step to 2 step with pain and freight (no rails at home) Goal status: Met 12/11/22  4.  Pt will tolerate multiple sessions without needing multiple rest periods. Baseline: gets winded walking through home Goal status: Met 12/11/22    LONG TERM GOALS: Target date: 01/01/23  Pt will improve FOTO functional score in order to demonstrate a statistically significant improvement in functional mobility and quality of life.  Baseline: 38% Goal status: INITIAL  2.  Pt will be independent with final HEP to improve their ability to self-manage symptoms  Baseline: no HEP Goal status: INITIAL  3.  Pt will be knowledgeable and will be able to teach back proper technique to rise from floor Baseline: unable/afraid Goal status: INITIAL  4.  RLE strength will be = LLE for improved functional mobility and to be prepared for surgical procedure Baseline: R weaker than L Goal status: INITIAL   PLAN:  PT FREQUENCY: 1-2x/week  PT DURATION:  7weeks sessions  PLANNED INTERVENTIONS: Therapeutic exercises, Therapeutic activity, Neuromuscular re-education, Balance training, Gait training, Patient/Family education, Self Care, Joint mobilization, Joint manipulation, Stair training, Orthotic/Fit training, DME instructions, Aquatic Therapy, Dry Needling, Electrical stimulation, Spinal manipulation, Spinal mobilization, Cryotherapy, scar mobilization, Splintting, Taping, Traction, Ionotophoresis '4mg'$ /ml Dexamethasone, Manual therapy, and Re-evaluation  PLAN FOR NEXT SESSION:  see above.   Kerin Perna, PTA 12/25/22 1:25 PM [facility] [address] [place], [place], [address] Phone: [phone]   Fax:  [phone]

## 2022-12-28 ENCOUNTER — Ambulatory Visit (HOSPITAL_BASED_OUTPATIENT_CLINIC_OR_DEPARTMENT_OTHER): Payer: BC Managed Care – PPO | Admitting: Physical Therapy

## 2022-12-28 DIAGNOSIS — M6281 Muscle weakness (generalized): Secondary | ICD-10-CM

## 2022-12-28 DIAGNOSIS — R269 Unspecified abnormalities of gait and mobility: Secondary | ICD-10-CM

## 2022-12-28 DIAGNOSIS — M25562 Pain in left knee: Secondary | ICD-10-CM | POA: Diagnosis not present

## 2022-12-28 DIAGNOSIS — G8929 Other chronic pain: Secondary | ICD-10-CM

## 2022-12-28 NOTE — Therapy (Signed)
OUTPATIENT PHYSICAL THERAPY LOWER EXTREMITY TREATMENT   Patient Name: Diana Herman MRN: [id number] DOB:August 22, 1966, 57 y.o., female Today's Date: 12/28/2022  END OF SESSION:  PT End of Session - 12/28/22 1016     Visit Number 10    Number of Visits 14    Date for PT Re-Evaluation 01/01/23    Authorization Type BCBS    PT Start Time 1015    PT Stop Time 1058    PT Time Calculation (min) 43 min    Activity Tolerance Patient tolerated treatment well    Behavior During Therapy WFL for tasks assessed/performed              Past Medical History:  Diagnosis Date   Achilles rupture, right 08/18/2018   Formatting of this note might be different from the original. 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   Severe sepsis with acute organ dysfunction ([place]) 10/15/2021   Sleep apnea    wears cpap    Past Surgical History:  Procedure Laterality Date   ACHILLES TENDON SURGERY Right    COLONOSCOPY  04/14/2021   [facility]   COLONOSCOPY WITH ESOPHAGOGASTRODUODENOSCOPY (EGD)  01/2022   HYSTERECTOMY     hysteroscopy and polypectomy     POLYPECTOMY     TONSILLECTOMY     WISDOM TOOTH EXTRACTION     early 90's    Patient Active Problem List   Diagnosis Date Noted   Depression with anxiety 10/15/2021   Nausea vomiting and diarrhea 08/13/2021   Type 2 diabetes mellitus without complication ([place])  99991111   Hx of adenomatous colonic polyps 04/28/2021   Long term current use of systemic steroids 09/21/2020   Primary hypertension 08/30/2020   Morbid obesity ([place]) 01/01/2019   Polymyalgia rheumatica ([place]) 08/27/2017   Primary osteoarthritis of left knee 06/30/2016   Primary osteoarthritis of right knee 06/30/2016   Anxiety 01/17/2016   Impaired glucose tolerance 01/17/2016   Mixed hyperlipidemia 01/17/2016   OSA on CPAP 01/17/2016    PCP: [facility]   REFERRING PROVIDER: Donzetta Sprung., MD   REFERRING DIAG: Bilateral primary osteoarthritis of knee   THERAPY DIAG:  Chronic pain of left knee  Chronic pain of right knee  Muscle weakness (generalized)  Gait abnormality  Rationale for Evaluation and Treatment: Rehabilitation  ONSET DATE: 15 yrs  SUBJECTIVE:   SUBJECTIVE STATEMENT: "Today is not a good day.  I twisted my back getting out of bed on Friday."      FROM EVAL: Not afraid of water; no access to pool at this time, maybe plan to gain access Knee arthritis originated from falls. 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 Basically sedentary, lost 30 lbs to get ready for TKR which is  scheduled Jan 08, 2023. Fibromyalgia, Polymyalgia rheumatica ([place]). Pt reports peripheral neuropathy's, DM controlled all which complicate pain. She is a Automotive engineer and maintains good health, just isn't an exercise person     Past Medical History:  Diagnosis Date   Achilles rupture, right 08/18/2018   Formatting of this note might be different from the original. 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 She has been on steroids for a long time and has now weaned off of the steroids for several weeks now. Since coming off the steroids the knee pain that she has been having has worsened. She would like to consider doing a total knee replacement on her right knee. She does have a history of C. difficile colitis and has had sepsis associated with this in the past from antibiotics. " PAIN:  Are you having pain? Yes: NPRS scale: 2-3/10 Pain location: generalized "everywhere"  Pain description: achy  Aggravating factors: any  movement, walking, STS Relieving factors: sitting; ice  PRECAUTIONS: Knee  WEIGHT BEARING RESTRICTIONS: No  FALLS:  Has patient fallen in last 6 months? 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 there is a calcified loose body in the suprapatellar region of the left knee.   PATIENT SURVEYS:  FOTO 38% with goal of 50%  COGNITION: Overall cognitive status: Within functional limits for tasks assessed     SENSATION: Bilateral feet: pins and needled bottom of feet  EDEMA:  N/A  MUSCLE LENGTH: Hamstrings: Right 85 deg; Left 90 deg   POSTURE: No Significant postural limitations  PALPATION: Crepitus R>L  LOWER EXTREMITY ROM:  Active ROM Right eval Left eval Right  12/11/22  Hip flexion     Hip extension     Hip abduction     Hip adduction     Hip internal rotation     Hip external rotation     Knee flexion 120 130   Knee extension -10 0 -4  Ankle dorsiflexion     Ankle plantarflexion     Ankle inversion     Ankle eversion      (Blank rows = not tested)  LOWER EXTREMITY MMT:  MMT Right eval Left eval  Hip flexion 4 5-  Hip extension 5 5  Hip abduction 5 5  Hip adduction 5 5  Hip internal rotation    Hip external rotation    Knee flexion 4 P! 4+ P!  Knee extension 5 5  Ankle dorsiflexion    Ankle plantarflexion    Ankle inversion    Ankle eversion     (Blank rows = not tested)  LOWER EXTREMITY  SPECIAL TESTS:  Knee special tests: Anterior drawer test: negative and Posterior drawer test: negative Thomas test: Right pos; Left neg  FUNCTIONAL TESTS:  5 times sit to stand: 17.07 Timed up and go (TUG): 12.55 SLS right 5s  Right 3s  GAIT: Distance walked: 400 Assistive device utilized: none Level of assistance: Complete [place] Comments: Antalgic right and left, decreased hip/knee flex; increased lateral displacement/hip circumduction   TODAY'S TREATMENT:                                                                                                                              2/9 Reviewed what to expect following surgery. Answered all questions she had about the procedure and following days.  Nu-step 5 min   Quad set x20  Supine march x20  Bridge 2x10  SLR 2x10 each leg  SAQ 2x10   Reviewed home stretching following surgery   Standing heel raise x20  Standing march 2x10    12/25/22 Pt seen for aquatic therapy today.  Treatment took place in water 3.25-4.5 ft in depth at the [facility]. Temp of water was 91.  Pt entered/exited the pool via stairs with reciprocal pattern and UE on rails.  * without UE support: backward gait, side stepping,  marching forward / backward  * at stairs:  forward step up/ retro x 10 each LE with bilat UE on rail;  lateral step ups x 10 RLE * holding yellow hand floats:  heel raises x 20; leg swings into hip flexion/ext x 10 each; hip abdct/addct x 10 * STS with feet on blue step with 4 sec slow eccentric lowering x 12 * LE circumduction forward/backward;  monster walk forward/ backward  * near wall:  noodle stomp (slow x 10, quick x 5) with solid noodle each LE * 3 way LE stretch (ITBm, adductor, hamstring ) x 15 sec each * quad/hip flexor stretch with foot on solid noodle  Pt requires the buoyancy and hydrostatic pressure of water for support, and to offload joints by unweighting joint load by at least 50 % in navel deep  water and by at least 75-80% in chest to neck deep water.  Viscosity of the water is needed for resistance of strengthening. Water current perturbations provides challenge to standing balance requiring increased core activation.    1/30 Reviewed use of exercise bike  Quad set x20 each leg  SLR 2x10 each leg  Hip abduction x20 red  Bridge 2x10  Standing heel raise x20  Standing low march 2x7  Reviewed things to think about prior to surgery   PATIENT EDUCATION:  Education details: aquatic progressions/ modifications  Person educated: Patient Education method: Explanation Education comprehension: verbalized understanding  HOME EXERCISE PROGRAM: To be assigned  ASSESSMENT:  CLINICAL IMPRESSION: The patient is in a good spot to proceed with her surgery. Her FOTO score has improved 10%. She had a full exercise program. She has been able to exercise without significant pain. We will D/C at this time and she will return following her surgery on 2/20.    OBJECTIVE IMPAIRMENTS: Abnormal gait, decreased activity tolerance, decreased balance, decreased endurance, decreased mobility, difficulty walking, decreased ROM, decreased strength, impaired sensation, and pain.   ACTIVITY LIMITATIONS: carrying, lifting, bending, sitting, standing, squatting, sleeping, stairs, transfers, and locomotion level  PARTICIPATION LIMITATIONS: cleaning, laundry, shopping, community activity, occupation, and yard work  PERSONAL FACTORS: Fitness, Past/current experiences, Time since onset of injury/illness/exacerbation, and 3+ comorbidities: Polymyalgia rheumatica; fibromyalgia; DM; peripheral neuropathies  are also affecting patient's functional outcome.   REHAB POTENTIAL: Good  CLINICAL DECISION MAKING: Evolving/moderate complexity  EVALUATION COMPLEXITY: Moderate   GOALS: Goals reviewed with patient? Yes  SHORT TERM GOALS: Target date: 12/11/22 Pt will improve right knee extension up to  -5d Baseline:-10 Goal status: ongoing  2.  Pt will report worst pain 3/10 or less with functional activity and less occurrence of sleep disturbance d/t pain.  Baseline: worst 4/10.  She sleeps better on nights she does aquatic therapy -  12/07/22 Goal status: Met 12/11/22  3.  Pt will be able to climb 2-3 stairs using safe pattern UE unsupported to demonstrate  improved safety entering/exiting home Baseline: step to 2 step with pain and freight (no rails at home) Goal status: Met 12/11/22  4.  Pt will tolerate multiple sessions without needing multiple rest periods. Baseline: gets winded walking through home Goal status: Met 12/11/22    LONG TERM GOALS: Target date: 01/01/23  Pt will improve FOTO functional score in order to demonstrate a statistically significant improvement in functional mobility and quality of life.  Baseline: 38% Goal status: INITIAL  2.  Pt will be independent with final HEP to improve their ability to self-manage symptoms  Baseline: no HEP Goal status: INITIAL  3.  Pt will be knowledgeable and will be able to teach back proper technique to rise from floor Baseline: unable/afraid Goal status: INITIAL  4.  RLE strength will be = LLE for improved functional mobility and to be prepared for surgical procedure Baseline: R weaker than L Goal status: INITIAL   PLAN:  PT FREQUENCY: 1-2x/week  PT DURATION:  7weeks sessions  PLANNED INTERVENTIONS: Therapeutic exercises, Therapeutic activity, Neuromuscular re-education, Balance training, Gait training, Patient/Family education, Self Care, Joint mobilization, Joint manipulation, Stair training, Orthotic/Fit training, DME instructions, Aquatic Therapy, Dry Needling, Electrical stimulation, Spinal manipulation, Spinal mobilization, Cryotherapy, scar mobilization, Splintting, Taping, Traction, Ionotophoresis 11m/ml Dexamethasone, Manual therapy, and Re-evaluation  PLAN FOR NEXT SESSION:  see above.   DCarolyne LittlesPT  DPT  12/28/22 10:29 AM [facility] [address] [place] [address]Phone: [phone]  Fax:  [phone]

## 2023-01-01 ENCOUNTER — Ambulatory Visit (HOSPITAL_BASED_OUTPATIENT_CLINIC_OR_DEPARTMENT_OTHER): Payer: Self-pay | Admitting: Physical Therapy

## 2023-01-04 ENCOUNTER — Encounter (HOSPITAL_BASED_OUTPATIENT_CLINIC_OR_DEPARTMENT_OTHER): Payer: Self-pay | Admitting: Physical Therapy

## 2023-01-11 ENCOUNTER — Encounter (HOSPITAL_BASED_OUTPATIENT_CLINIC_OR_DEPARTMENT_OTHER): Payer: Self-pay | Admitting: Physical Therapy

## 2023-01-11 ENCOUNTER — Ambulatory Visit (HOSPITAL_BASED_OUTPATIENT_CLINIC_OR_DEPARTMENT_OTHER): Payer: BC Managed Care – PPO | Admitting: Physical Therapy

## 2023-01-11 DIAGNOSIS — M25561 Pain in right knee: Secondary | ICD-10-CM

## 2023-01-11 DIAGNOSIS — M25661 Stiffness of right knee, not elsewhere classified: Secondary | ICD-10-CM

## 2023-01-11 DIAGNOSIS — M25562 Pain in left knee: Secondary | ICD-10-CM | POA: Diagnosis not present

## 2023-01-11 DIAGNOSIS — R600 Localized edema: Secondary | ICD-10-CM

## 2023-01-11 DIAGNOSIS — M6281 Muscle weakness (generalized): Secondary | ICD-10-CM

## 2023-01-11 DIAGNOSIS — R2689 Other abnormalities of gait and mobility: Secondary | ICD-10-CM

## 2023-01-11 NOTE — Therapy (Signed)
OUTPATIENT PHYSICAL THERAPY LOWER EXTREMITY EVALUATION   Patient Name: Diana Herman MRN: [id number] DOB:04-15-1966, 57 y.o., female Today's Date: 01/11/2023  END OF SESSION:  PT End of Session - 01/11/23 1239     Visit Number 1    Number of Visits 16    Date for PT Re-Evaluation 03/08/23    Authorization Type BCBS    PT Start Time 0930    PT Stop Time 1012    PT Time Calculation (min) 42 min    Activity Tolerance Patient tolerated treatment well    Behavior During Therapy [facility] for tasks assessed/performed             Past Medical History:  Diagnosis Date   Achilles rupture, right 08/18/2018   Formatting of this note might be different from the original. 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   Severe sepsis with acute organ dysfunction ([place]) 10/15/2021   Sleep apnea    wears cpap    Past Surgical History:  Procedure Laterality Date   ACHILLES TENDON SURGERY Right    COLONOSCOPY  04/14/2021   [facility]   COLONOSCOPY WITH ESOPHAGOGASTRODUODENOSCOPY (EGD)  01/2022   HYSTERECTOMY     hysteroscopy and polypectomy     POLYPECTOMY     TONSILLECTOMY     WISDOM TOOTH EXTRACTION     early 90's    Patient Active Problem List   Diagnosis Date Noted   Depression with anxiety 10/15/2021   Nausea vomiting and diarrhea 08/13/2021   Type 2 diabetes mellitus without complication ([place])  99991111   Hx of adenomatous colonic polyps 04/28/2021   Long term current use of systemic steroids 09/21/2020   Primary hypertension 08/30/2020   Morbid obesity ([place]) 01/01/2019   Polymyalgia rheumatica ([place]) 08/27/2017   Primary osteoarthritis of left knee 06/30/2016   Primary osteoarthritis of right knee 06/30/2016   Anxiety 01/17/2016   Impaired glucose tolerance 01/17/2016   Mixed hyperlipidemia 01/17/2016   OSA on CPAP 01/17/2016    PCP: Elenor Quinones   REFERRING PROVIDER: Jaynee Eagles MD   REFERRING DIAG: right TKA  THERAPY DIAG:  Stiffness of right knee, not elsewhere classified  Muscle weakness (generalized)  Other abnormalities of gait and mobility  Localized edema  Acute pain of right knee  Rationale for Evaluation and Treatment: Rehabilitation  ONSET DATE: 01/08/2023  SUBJECTIVE:   SUBJECTIVE STATEMENT: Patient has a long history of bilateral knee pain.  She recently worked at her clinic on prehab to strengthen prior to her knee replacement.  She had a right knee replacement on 01/08/2023.  At this time her pain is well-controlled.  She is using a roller walker for primary mobility.  She will have the left knee replaced in the future.  PERTINENT HISTORY:  Right achilles repair, polymyalgia rheumatica; OA of both knees, sleep apneas. Depression, anxiety PAIN:  Are you having pain? Yes: NPRS scale: 5/10 Pain location: right knee  Pain description: aching  Aggravating factors: standing and walking  Relieving factors: rest   PRECAUTIONS: Knee  WEIGHT BEARING RESTRICTIONS: Yes WBAT   FALLS:  Has patient fallen in last 6 months? 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   Hobbies:  Play with her dog; walk her dog  PLOF: Independent  PATIENT GOALS:   To be able to do normal activity without increased pain   NEXT MD VISIT:  March 1st    OBJECTIVE:   DIAGNOSTIC FINDINGS: nothing post  op  PATIENT SURVEYS:  FOTO    COGNITION: Overall cognitive status: Within functional limits for tasks assessed     SENSATION: Denies paresthesias   EDEMA:  Nothing significant  MUSCLE LENGTH:  POSTURE: No Significant postural limitations  PALPATION: No unexpected TTP   LOWER EXTREMITY ROM:  Passive ROM Right eval Left eval  Hip flexion    Hip extension    Hip abduction    Hip adduction    Hip internal rotation    Hip external rotation    Knee flexion  85 degrees  Knee extension  -10  Ankle dorsiflexion    Ankle plantarflexion    Ankle inversion    Ankle eversion     (Blank rows = not tested)  LOWER EXTREMITY MMT:  MMT Right eval Left eval  Hip flexion    Hip extension    Hip abduction    Hip adduction    Hip internal rotation    Hip external rotation    Knee flexion    Knee extension    Ankle dorsiflexion    Ankle plantarflexion    Ankle inversion    Ankle eversion     (Blank rows = not tested) not tested secondary recent surgery   FUNCTIONAL TESTS:  Bed mobility: Uses arms to lift right leg up onto the bed. Sit to stand transfer: Uses arms to push up. GAIT: Using a rolling walker. Decreased weight bearing on the left. Using UE to take weight off.   TODAY'S TREATMENT:                                                                                                                              DATE:  Exercises - Supine Heel Slide with Strap  - 1 x daily - 7 x weekly - 3 sets - 10 reps - Supine Knee Extension Stretch on Towel Roll  - 1 x daily - 7 x weekly - 3 sets - 10 reps - Heel Raises with Counter Support  - 1 x daily - 7 x weekly - 3 sets - 10 reps - Small Range Straight Leg Raise  - 1 x daily - 7 x weekly - 3 sets - 10  reps - Supine Quad Set  - 1 x daily - 7 x weekly - 3 sets - 10 reps\  Manual: Passive range of motion into flexion and extension, trigger point release to posterior knee.   PATIENT EDUCATION:  Education details: HEP,  symptom management;  Person educated: Patient Education method: Explanation, Demonstration, Tactile cues, Verbal cues, and Handouts Education comprehension: verbalized understanding, returned demonstration, verbal cues required, tactile cues required, and needs further education  HOME EXERCISE PROGRAM: Access Code: [facility] URL: [email or web address] Date: 01/11/2023 Prepared by: Carolyne Littles   ASSESSMENT:  CLINICAL IMPRESSION: The patient is a 57 year old female S/P right TKA on 01/08/2023. She presents with expected limitation in ROM, function, and strength. Her pain is well controlled. She did pre-hab prior to her surgery. She would benefit from skilled therapy to be able to improve her ability to walk in the community.  She will have her left knee done at sometime in the future.  She would also like to be able to walk her dog. OBJECTIVE IMPAIRMENTS: Abnormal gait, decreased activity tolerance, decreased knowledge of use of DME, decreased mobility, difficulty walking, decreased ROM, decreased strength, and pain.   ACTIVITY LIMITATIONS: carrying, lifting, bending, sitting, standing, squatting, sleeping, stairs, transfers, bed mobility, bathing, dressing, and locomotion level  PARTICIPATION LIMITATIONS: meal prep, cleaning, laundry, driving, shopping, community activity, occupation, and yard work  PERSONAL FACTORS: 1-2 comorbidities: polymyalgia rheumatica, anxiety, and depression   are also affecting patient's functional outcome.   REHAB POTENTIAL: Good  CLINICAL DECISION MAKING: Stable/uncomplicated  EVALUATION COMPLEXITY: Low   GOALS: Goals reviewed with patient? Yes  SHORT TERM GOALS: Target date: 02/08/2023   Patient will increase right knee flexion to 110 degrees Baseline: Goal status: INITIAL  2.  Patient will demonstrate full right knee extension Baseline:  Goal status: INITIAL  3.  Patient will ambulate 300 feet with single-point cane showing good  safety and weightbearing on the right side Baseline:  Goal status: INITIAL  4.  Patient will be independent with initial exercise program Baseline:  Goal status: INITIAL  LONG TERM GOALS: Target date: 03/08/2023    Patient will go up and down 8 steps with reciprocal gait in order to ambulate in the community Baseline:  Goal status: INITIAL  2.  Patient will ambulate 3000 feet with least restrictive assistive device in order to go shopping and walk her dog Baseline:  Goal status: INITIAL  3.  Patient will be independent with complete exercise program Baseline:  Goal status: INITIAL  4.  Patient will stand for greater than 1 hour without increased right knee pain in order to perform ADLs and IADLs Baseline:  Goal status: INITIAL   PLAN:  PT FREQUENCY: 2x/week  PT DURATION: 8 weeks  PLANNED INTERVENTIONS: Therapeutic exercises, Therapeutic activity, Neuromuscular re-education, Balance training, Gait training, Patient/Family education, Self Care, Joint mobilization, Stair training, Aquatic Therapy, Dry Needling, Cryotherapy, Moist heat, Ultrasound, and Manual therapy  PLAN FOR NEXT SESSION: begin PROM into flexion and extension. Progress to SLR as tolerated and SAQ; progress standing exercises as tolerated. Start nu-step.    Carney Living, PT 01/11/2023, 12:46 PM

## 2023-01-15 ENCOUNTER — Ambulatory Visit (HOSPITAL_BASED_OUTPATIENT_CLINIC_OR_DEPARTMENT_OTHER): Payer: BC Managed Care – PPO

## 2023-01-15 ENCOUNTER — Encounter (HOSPITAL_BASED_OUTPATIENT_CLINIC_OR_DEPARTMENT_OTHER): Payer: Self-pay

## 2023-01-15 DIAGNOSIS — M6281 Muscle weakness (generalized): Secondary | ICD-10-CM

## 2023-01-15 DIAGNOSIS — M25561 Pain in right knee: Secondary | ICD-10-CM

## 2023-01-15 DIAGNOSIS — R600 Localized edema: Secondary | ICD-10-CM

## 2023-01-15 DIAGNOSIS — R2689 Other abnormalities of gait and mobility: Secondary | ICD-10-CM

## 2023-01-15 DIAGNOSIS — M25562 Pain in left knee: Secondary | ICD-10-CM | POA: Diagnosis not present

## 2023-01-15 NOTE — Therapy (Signed)
OUTPATIENT PHYSICAL THERAPY LOWER EXTREMITY EVALUATION   Patient Name: Diana Herman MRN: [id number] DOB:[birth year], 57 y.o., female Today's Date: 01/15/2023  END OF SESSION:  PT End of Session - 01/15/23 1512     Visit Number 2    Number of Visits 16    Date for PT Re-Evaluation 03/08/23    Authorization Type BCBS    PT Start Time 1430    PT Stop Time 1509    PT Time Calculation (min) 39 min    Activity Tolerance Patient tolerated treatment well    Behavior During Therapy [facility] for tasks assessed/performed              Past Medical History:  Diagnosis Date   Achilles rupture, right 08/18/2018   Formatting of this note might be different from the original. 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/2021   Severe sepsis with acute organ dysfunction ([place]) 10/15/2021   Sleep apnea    wears cpap    Past Surgical History:  Procedure Laterality Date   ACHILLES TENDON SURGERY Right    COLONOSCOPY  04/14/2021   [facility]   COLONOSCOPY WITH ESOPHAGOGASTRODUODENOSCOPY (EGD)  01/2022   HYSTERECTOMY     hysteroscopy and polypectomy     POLYPECTOMY     TONSILLECTOMY     WISDOM TOOTH EXTRACTION     early 90's    Patient Active Problem List   Diagnosis Date Noted   Depression with anxiety 10/15/2021   Nausea vomiting and diarrhea 08/13/2021   Type 2 diabetes mellitus without complication ([place])  99991111   Hx of adenomatous colonic polyps 04/28/2021   Long term current use of systemic steroids 09/21/2020   Primary hypertension 08/30/2020   Morbid obesity ([place]) 01/01/2019   Polymyalgia rheumatica ([place]) 08/27/2017   Primary osteoarthritis of left knee 06/30/2016   Primary osteoarthritis of right knee 06/30/2016   Anxiety 01/17/2016   Impaired glucose tolerance 01/17/2016   Mixed hyperlipidemia 01/17/2016   OSA on CPAP 01/17/2016    PCP: Elenor Quinones   REFERRING PROVIDER: Jaynee Eagles MD   REFERRING DIAG: right TKA  THERAPY DIAG:  Muscle weakness (generalized)  Other abnormalities of gait and mobility  Localized edema  Acute pain of right knee  Rationale for Evaluation and Treatment: Rehabilitation  ONSET DATE: 01/08/2023  SUBJECTIVE:   SUBJECTIVE STATEMENT: Pt reports pain has been manageable. Takes pain medicine. Compliant with HEP.   PERTINENT HISTORY: Right achilles repair, polymyalgia rheumatica; OA of both knees, sleep apneas. Depression, anxiety PAIN:  Are you having pain? Yes: NPRS scale: 5/10 Pain location: right knee  Pain description: aching  Aggravating factors: standing and walking  Relieving factors: rest   PRECAUTIONS: Knee  WEIGHT BEARING RESTRICTIONS: Yes WBAT   FALLS:  Has patient  fallen in last 6 months? 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   Hobbies:  Play with her dog; walk her dog  PLOF: Independent  PATIENT GOALS:   To be able to do normal activity without increased pain   NEXT MD VISIT:  March 1st    OBJECTIVE:   DIAGNOSTIC FINDINGS: nothing post op  PATIENT SURVEYS:  FOTO    COGNITION: Overall cognitive status: Within functional limits for tasks assessed     SENSATION: Denies paresthesias   EDEMA:  Nothing significant  MUSCLE LENGTH:  POSTURE: No Significant postural limitations  PALPATION: No unexpected TTP   LOWER EXTREMITY  ROM:  Passive ROM Right eval Left eval  Hip flexion    Hip extension    Hip abduction    Hip adduction    Hip internal rotation    Hip external rotation    Knee flexion  85 degrees  Knee extension  -10  Ankle dorsiflexion    Ankle plantarflexion    Ankle inversion    Ankle eversion     (Blank rows = not tested)  LOWER EXTREMITY MMT:  MMT Right eval Left eval  Hip flexion    Hip extension    Hip abduction    Hip adduction    Hip internal rotation    Hip external rotation    Knee flexion    Knee extension    Ankle dorsiflexion    Ankle plantarflexion    Ankle inversion    Ankle eversion     (Blank rows = not tested) not tested secondary recent surgery   FUNCTIONAL TESTS:  Bed mobility: Uses arms to lift right leg up onto the bed. Sit to stand transfer: Uses arms to push up. GAIT: Using a rolling walker. Decreased weight bearing on the left. Using UE to take weight off.   TODAY'S TREATMENT:                                                                                                                              DATE:   R knee PROM Quad set-5" 2x10 SAQ-5" 2x10 LAQ 5" 2x10 Bridge 5" 2x10 Heel slides 5" 2x10  Gait with FWW-1/2hall and back Gait with SPC (trial with SBA) 1/2 hall and back Heel raises 2x10   PATIENT EDUCATION:  Education details: HEP, symptom management;  Person educated: Patient Education method: Explanation, Demonstration, Tactile cues, Verbal cues, and Handouts Education comprehension: verbalized understanding, returned demonstration, verbal cues required, tactile cues required, and needs further education  HOME EXERCISE PROGRAM: Access Code: [facility] URL: [email or web address] Date: 01/11/2023 Prepared by: Carolyne Littles   ASSESSMENT:  CLINICAL IMPRESSION: Measured pt at 93deg active knee flexion, an improvement from IE. She demonstrates overall good quad activation with quad set and SAQ. Educated pt about PT  expectations and healing process. She is concerned about area under distal bandage with feels "tight" to her.  No visual concerns noted, but instructed pt to notify MD office if this continues to be bothersome. Pt is making excellent progress thus far and had great tolerance for PT session. Trialed ambulating with SPC with proper sequencing demonstrated with minimal cues and no unsteadiness. She is not able to maintain use of cane safely for long distances at this time due to endurance level, so pt was instructed to continue with use of FWW until cleared to progress. Reviewed HEP with pt and added SAQ/LAQ for quad strengthening. Will continue to progress as tolerated.   OBJECTIVE IMPAIRMENTS: Abnormal gait, decreased activity tolerance, decreased knowledge of use of DME, decreased mobility, difficulty walking, decreased ROM, decreased strength, and pain.   ACTIVITY LIMITATIONS: carrying, lifting, bending, sitting, standing, squatting, sleeping, stairs, transfers, bed mobility, bathing, dressing, and locomotion level  PARTICIPATION LIMITATIONS: meal prep, cleaning, laundry, driving, shopping, community activity, occupation, and yard work  PERSONAL FACTORS: 1-2 comorbidities: polymyalgia rheumatica, anxiety, and depression   are also affecting patient's functional outcome.   REHAB POTENTIAL: Good  CLINICAL DECISION MAKING: Stable/uncomplicated  EVALUATION COMPLEXITY: Low   GOALS: Goals reviewed with patient? Yes  SHORT TERM GOALS: Target date: 02/08/2023   Patient will increase right knee flexion to 110 degrees Baseline: Goal status: IN PROGRESS 01/15/23  2.  Patient will demonstrate full right knee extension Baseline:  Goal status: INITIAL  3.  Patient will ambulate 300 feet with single-point cane showing good safety and weightbearing on the right side Baseline:  Goal status: INITIAL  4.  Patient will be independent with initial exercise program Baseline:  Goal status: IN PROGRESS  01/15/23  LONG TERM GOALS: Target date: 03/08/2023    Patient will go up and down 8 steps with reciprocal gait in order to ambulate in the community Baseline:  Goal status: INITIAL  2.  Patient will ambulate 3000 feet with least restrictive assistive device in order to go shopping and walk her dog Baseline:  Goal status: INITIAL  3.  Patient will be independent with complete exercise program Baseline:  Goal status: INITIAL  4.  Patient will stand for greater than 1 hour without increased right knee pain in order to perform ADLs and IADLs Baseline:  Goal status: INITIAL   PLAN:  PT FREQUENCY: 2x/week  PT DURATION: 8 weeks  PLANNED INTERVENTIONS: Therapeutic exercises, Therapeutic activity, Neuromuscular re-education, Balance training, Gait training, Patient/Family education, Self Care, Joint mobilization, Stair training, Aquatic Therapy, Dry Needling, Cryotherapy, Moist heat, Ultrasound, and Manual therapy  PLAN FOR NEXT SESSION: begin PROM into flexion and extension. Progress to SLR as tolerated and SAQ; progress standing exercises as tolerated. Start nu-step.    Graceann Congress Jehad Bisono, PTA 01/15/2023, 4:20 PM

## 2023-01-18 ENCOUNTER — Ambulatory Visit (HOSPITAL_BASED_OUTPATIENT_CLINIC_OR_DEPARTMENT_OTHER): Payer: BC Managed Care – PPO | Attending: Sports Medicine | Admitting: Physical Therapy

## 2023-01-18 ENCOUNTER — Encounter (HOSPITAL_BASED_OUTPATIENT_CLINIC_OR_DEPARTMENT_OTHER): Payer: Self-pay | Admitting: Physical Therapy

## 2023-01-18 DIAGNOSIS — R2689 Other abnormalities of gait and mobility: Secondary | ICD-10-CM

## 2023-01-18 DIAGNOSIS — M6281 Muscle weakness (generalized): Secondary | ICD-10-CM

## 2023-01-18 DIAGNOSIS — M25661 Stiffness of right knee, not elsewhere classified: Secondary | ICD-10-CM | POA: Diagnosis present

## 2023-01-18 DIAGNOSIS — R600 Localized edema: Secondary | ICD-10-CM

## 2023-01-18 DIAGNOSIS — M25561 Pain in right knee: Secondary | ICD-10-CM | POA: Diagnosis present

## 2023-01-18 NOTE — Therapy (Signed)
OUTPATIENT PHYSICAL THERAPY LOWER EXTREMITY EVALUATION   Patient Name: Diana Herman MRN: [id number] DOB:Oct 27, 1966, 57 y.o., female Today's Date: 01/15/2023  END OF SESSION:  PT End of Session - 01/15/23 1512     Visit Number 2    Number of Visits 16    Date for PT Re-Evaluation 03/08/23    Authorization Type BCBS    PT Start Time 1430    PT Stop Time 1509    PT Time Calculation (min) 39 min    Activity Tolerance Patient tolerated treatment well    Behavior During Therapy [facility] for tasks assessed/performed              Past Medical History:  Diagnosis Date   Achilles rupture, right 08/18/2018   Formatting of this note might be different from the original. 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   Severe sepsis with acute organ dysfunction ([place]) 10/15/2021   Sleep apnea    wears cpap    Past Surgical History:  Procedure Laterality Date   ACHILLES TENDON SURGERY Right    COLONOSCOPY  04/14/2021   [facility]   COLONOSCOPY WITH ESOPHAGOGASTRODUODENOSCOPY (EGD)  01/2022   HYSTERECTOMY     hysteroscopy and polypectomy     POLYPECTOMY     TONSILLECTOMY     WISDOM TOOTH EXTRACTION     early 90's    Patient Active Problem List   Diagnosis Date Noted   Depression with anxiety 10/15/2021   Nausea vomiting and diarrhea 08/13/2021   Type 2 diabetes mellitus without complication ([place])  99991111   Hx of adenomatous colonic polyps 04/28/2021   Long term current use of systemic steroids 09/21/2020   Primary hypertension 08/30/2020   Morbid obesity ([place]) 01/01/2019   Polymyalgia rheumatica ([place]) 08/27/2017   Primary osteoarthritis of left knee 06/30/2016   Primary osteoarthritis of right knee 06/30/2016   Anxiety 01/17/2016   Impaired glucose tolerance 01/17/2016   Mixed hyperlipidemia 01/17/2016   OSA on CPAP 01/17/2016    PCP: Elenor Quinones   REFERRING PROVIDER: Jaynee Eagles MD   REFERRING DIAG: right TKA  THERAPY DIAG:  Muscle weakness (generalized)  Other abnormalities of gait and mobility  Localized edema  Acute pain of right knee  Rationale for Evaluation and Treatment: Rehabilitation  ONSET DATE: 01/08/2023  SUBJECTIVE:   SUBJECTIVE STATEMENT: Pt reports pain has been manageable. Takes pain medicine. Compliant with HEP.   PERTINENT HISTORY: Right achilles repair, polymyalgia rheumatica; OA of both knees, sleep apneas. Depression, anxiety PAIN:  Are you having pain? Yes: NPRS scale: 5/10 Pain location: right knee  Pain description: aching  Aggravating factors: standing and walking  Relieving factors: rest   PRECAUTIONS: Knee  WEIGHT BEARING RESTRICTIONS: Yes WBAT   FALLS:  Has patient  fallen in last 6 months? 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   Hobbies:  Play with her dog; walk her dog  PLOF: Independent  PATIENT GOALS:   To be able to do normal activity without increased pain   NEXT MD VISIT:  March 1st    OBJECTIVE:   DIAGNOSTIC FINDINGS: nothing post op  PATIENT SURVEYS:  FOTO    COGNITION: Overall cognitive status: Within functional limits for tasks assessed     SENSATION: Denies paresthesias   EDEMA:  Nothing significant  MUSCLE LENGTH:  POSTURE: No Significant postural limitations  PALPATION: No unexpected TTP   LOWER EXTREMITY  ROM:  Passive ROM Right eval Left eval  Hip flexion    Hip extension    Hip abduction    Hip adduction    Hip internal rotation    Hip external rotation    Knee flexion  85 degrees  Knee extension  -10  Ankle dorsiflexion    Ankle plantarflexion    Ankle inversion    Ankle eversion     (Blank rows = not tested)  LOWER EXTREMITY MMT:  MMT Right eval Left eval  Hip flexion    Hip extension    Hip abduction    Hip adduction    Hip internal rotation    Hip external rotation    Knee flexion    Knee extension    Ankle dorsiflexion    Ankle plantarflexion    Ankle inversion    Ankle eversion     (Blank rows = not tested) not tested secondary recent surgery   FUNCTIONAL TESTS:  Bed mobility: Uses arms to lift right leg up onto the bed. Sit to stand transfer: Uses arms to push up. GAIT: Using a rolling walker. Decreased weight bearing on the left. Using UE to take weight off.   TODAY'S TREATMENT:                                                                                                                              DATE:   3/1  R knee PROM Quad set-5" 2x10 SAQ-5" 2x10 Bridge 5" 2x10 Heel slides 5" 5x   Seated knee extension stretch 5x10 sec hold   Gait with FWW-1/2hall and back Gait with SPC (trial with SBA) 1/2 hall and back decreased cuing required. Good reciprocal gait noted   Heel raises 2x10 Standing left legf half march for weight bearing on the rigght x20     PATIENT EDUCATION:  Education details: HEP, symptom management;  Person educated: Patient Education method: Explanation, Demonstration, Tactile cues, Verbal cues, and Handouts Education comprehension: verbalized understanding, returned demonstration, verbal cues required, tactile cues required, and needs further education  HOME EXERCISE PROGRAM: Access Code: [facility] URL: [email or web address] Date: 01/11/2023 Prepared by: Carolyne Littles   ASSESSMENT:  CLINICAL  IMPRESSION: The patients extension continues to be the most limited. She was  at -10 degrees today but improved to -8 with mobilization and soft tissue work. Her flexion improved to 96 degrees. She was given a seated extension stretch she can do at home. She will be returning to work next week. We reviewed the use of a SPC. She was advised when she feels stable with it to start walking short distances in her house with it. She looked stable from our observation of gait. We will continue to practice at home.   OBJECTIVE IMPAIRMENTS: Abnormal gait, decreased activity tolerance, decreased knowledge of use of DME, decreased mobility, difficulty walking, decreased ROM, decreased strength, and pain.   ACTIVITY LIMITATIONS: carrying, lifting, bending, sitting, standing, squatting, sleeping, stairs, transfers, bed mobility, bathing, dressing, and locomotion level  PARTICIPATION LIMITATIONS: meal prep, cleaning, laundry, driving, shopping, community activity, occupation, and yard work  PERSONAL FACTORS: 1-2 comorbidities: polymyalgia rheumatica, anxiety, and depression   are also affecting patient's functional outcome.   REHAB POTENTIAL: Good  CLINICAL DECISION MAKING: Stable/uncomplicated  EVALUATION COMPLEXITY: Low   GOALS: Goals reviewed with patient? Yes  SHORT TERM GOALS: Target date: 02/08/2023   Patient will increase right knee flexion to 110 degrees Baseline: Goal status: IN PROGRESS 01/15/23  2.  Patient will demonstrate full right knee extension Baseline:  Goal status: INITIAL  3.  Patient will ambulate 300 feet with single-point cane showing good safety and weightbearing on the right side Baseline:  Goal status: INITIAL  4.  Patient will be independent with initial exercise program Baseline:  Goal status: IN PROGRESS 01/15/23  LONG TERM GOALS: Target date: 03/08/2023    Patient will go up and down 8 steps with reciprocal gait in order to ambulate in the community Baseline:   Goal status: INITIAL  2.  Patient will ambulate 3000 feet with least restrictive assistive device in order to go shopping and walk her dog Baseline:  Goal status: INITIAL  3.  Patient will be independent with complete exercise program Baseline:  Goal status: INITIAL  4.  Patient will stand for greater than 1 hour without increased right knee pain in order to perform ADLs and IADLs Baseline:  Goal status: INITIAL   PLAN:  PT FREQUENCY: 2x/week  PT DURATION: 8 weeks  PLANNED INTERVENTIONS: Therapeutic exercises, Therapeutic activity, Neuromuscular re-education, Balance training, Gait training, Patient/Family education, Self Care, Joint mobilization, Stair training, Aquatic Therapy, Dry Needling, Cryotherapy, Moist heat, Ultrasound, and Manual therapy  PLAN FOR NEXT SESSION: begin PROM into flexion and extension. Progress to SLR as tolerated and SAQ; progress standing exercises as tolerated. Start nu-step.    Carolyne Littles PT DPT  01/15/2023, 4:20 PM

## 2023-01-22 ENCOUNTER — Encounter (HOSPITAL_BASED_OUTPATIENT_CLINIC_OR_DEPARTMENT_OTHER): Payer: Self-pay | Admitting: Physical Therapy

## 2023-01-22 ENCOUNTER — Ambulatory Visit (HOSPITAL_BASED_OUTPATIENT_CLINIC_OR_DEPARTMENT_OTHER): Payer: BC Managed Care – PPO | Admitting: Physical Therapy

## 2023-01-22 DIAGNOSIS — M6281 Muscle weakness (generalized): Secondary | ICD-10-CM

## 2023-01-22 DIAGNOSIS — M25661 Stiffness of right knee, not elsewhere classified: Secondary | ICD-10-CM

## 2023-01-22 DIAGNOSIS — R600 Localized edema: Secondary | ICD-10-CM

## 2023-01-22 DIAGNOSIS — R2689 Other abnormalities of gait and mobility: Secondary | ICD-10-CM

## 2023-01-22 DIAGNOSIS — M25561 Pain in right knee: Secondary | ICD-10-CM

## 2023-01-22 NOTE — Therapy (Signed)
OUTPATIENT PHYSICAL THERAPY LOWER EXTREMITY Treatment   Patient Name: Diana Herman MRN: [id number] DOB:05-24-1966, 57 y.o., female Today's Date: 01/22/2023  END OF SESSION:     Past Medical History:  Diagnosis Date   Achilles rupture, right 08/18/2018   Formatting of this note might be different from the original. 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   Severe sepsis with acute organ dysfunction ([place]) 10/15/2021   Sleep apnea    wears cpap    Past Surgical History:  Procedure Laterality Date   ACHILLES TENDON SURGERY Right    COLONOSCOPY  04/14/2021   [facility]   COLONOSCOPY WITH ESOPHAGOGASTRODUODENOSCOPY (EGD)  01/2022   HYSTERECTOMY     hysteroscopy and polypectomy     POLYPECTOMY     TONSILLECTOMY     WISDOM TOOTH EXTRACTION     early 90's    Patient Active Problem List   Diagnosis Date Noted   Depression with anxiety 10/15/2021   Nausea vomiting and diarrhea 08/13/2021   Type 2 diabetes mellitus without complication ([place]) 99991111   Hx of adenomatous colonic polyps 04/28/2021   Long term current use of systemic steroids 09/21/2020   Primary hypertension 08/30/2020   Morbid obesity ([place]) 01/01/2019   Polymyalgia rheumatica ([place]) 08/27/2017   Primary osteoarthritis of left knee 06/30/2016   Primary osteoarthritis of right knee 06/30/2016   Anxiety 01/17/2016   Impaired  glucose tolerance 01/17/2016   Mixed hyperlipidemia 01/17/2016   OSA on CPAP 01/17/2016    PCP: Elenor Quinones   REFERRING PROVIDER: Jaynee Eagles MD   REFERRING DIAG: right TKA  THERAPY DIAG:  Muscle weakness (generalized)  Other abnormalities of gait and mobility  Localized edema  Acute pain of right knee  Stiffness of right knee, not elsewhere classified  Rationale for Evaluation and Treatment: Rehabilitation  ONSET DATE: 01/08/2023  SUBJECTIVE:   SUBJECTIVE STATEMENT: Pt reports pain has been manageable. Takes pain medicine. Compliant with HEP.   PERTINENT HISTORY: Right achilles repair, polymyalgia rheumatica; OA of both knees, sleep apneas. Depression, anxiety PAIN:  Are you having pain? Yes: NPRS scale: 4/10 sore  Pain location: right knee  Pain description: aching  Aggravating factors: standing and walking  Relieving factors: rest   PRECAUTIONS: Knee  WEIGHT BEARING RESTRICTIONS: Yes WBAT   FALLS:  Has patient fallen in last 6 months? 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   Hobbies:  Play with her dog; walk her dog  PLOF: Independent  PATIENT GOALS:   To be able to do normal activity without increased pain   NEXT MD VISIT:  March 1st    OBJECTIVE:   DIAGNOSTIC FINDINGS: nothing post op  PATIENT SURVEYS:  FOTO  COGNITION: Overall cognitive status: Within functional limits for tasks assessed     SENSATION: Denies paresthesias   EDEMA:  Nothing significant  MUSCLE LENGTH:  POSTURE: No Significant postural limitations  PALPATION: No unexpected TTP   LOWER EXTREMITY ROM:  Passive ROM Right eval Left eval   Hip flexion     Hip extension     Hip abduction     Hip adduction     Hip internal rotation     Hip external rotation     Knee flexion  85 degrees 96  Knee extension  -10 -6  Ankle dorsiflexion     Ankle plantarflexion     Ankle inversion     Ankle  eversion      (Blank rows = not tested)  LOWER EXTREMITY MMT:  MMT Right eval Left eval  Hip flexion    Hip extension    Hip abduction    Hip adduction    Hip internal rotation    Hip external rotation    Knee flexion    Knee extension    Ankle dorsiflexion    Ankle plantarflexion    Ankle inversion    Ankle eversion     (Blank rows = not tested) not tested secondary recent surgery   FUNCTIONAL TESTS:  Bed mobility: Uses arms to lift right leg up onto the bed. Sit to stand transfer: Uses arms to push up. GAIT: Using a rolling walker. Decreased weight bearing on the left. Using UE to take weight off.   TODAY'S TREATMENT:                                                                                                                              DATE:   3/5 R knee PROM Grade II and III PA and AP mobilization for flexion and extension  Quad set-5" 2x10 SAQ-5" 2x10 Heel slides 5" 5x   LAQ 2x15  Self knee flexion stretch 5x 10 sec hold   Step ups: 2 inch 2x10  Side steps 2x10 2 inch     3/1  R knee PROM Quad set-5" 2x10 SAQ-5" 2x10 Bridge 5" 2x10 Heel slides 5" 5x   Seated knee extension stretch 5x10 sec hold   Gait with FWW-1/2hall and back Gait with SPC (trial with SBA) 1/2 hall and back decreased cuing required. Good reciprocal gait noted   Heel raises 2x10 Standing left legf half march for weight bearing on the rigght x20     PATIENT EDUCATION:  Education details: HEP, symptom management;  Person educated: Patient Education method: Explanation, Demonstration, Tactile cues, Verbal cues, and Handouts Education comprehension: verbalized understanding, returned demonstration, verbal cues required, tactile cues required, and needs further education  HOME EXERCISE PROGRAM: Access Code: [facility] URL: [email or web address] Date: 01/11/2023 Prepared by: Carolyne Littles   ASSESSMENT:  CLINICAL IMPRESSION: The patient is making great  progress. Her extension ROM improved by 4 degrees. Her knee flexion remained at 96 degrees. We  initiated stair training today she tolerated well. We reviewed a self stretch for flexion in sitting today. She will continue to work on her stretching at home. We will advance functional strengthening as tolerated.   OBJECTIVE IMPAIRMENTS: Abnormal gait, decreased activity tolerance, decreased knowledge of use of DME, decreased mobility, difficulty walking, decreased ROM, decreased strength, and pain.   ACTIVITY LIMITATIONS: carrying, lifting, bending, sitting, standing, squatting, sleeping, stairs, transfers, bed mobility, bathing, dressing, and locomotion level  PARTICIPATION LIMITATIONS: meal prep, cleaning, laundry, driving, shopping, community activity, occupation, and yard work  PERSONAL FACTORS: 1-2 comorbidities: polymyalgia rheumatica, anxiety, and depression   are also affecting patient's functional outcome.   REHAB POTENTIAL: Good  CLINICAL DECISION MAKING: Stable/uncomplicated  EVALUATION COMPLEXITY: Low   GOALS: Goals reviewed with patient? Yes  SHORT TERM GOALS: Target date: 02/08/2023   Patient will increase right knee flexion to 110 degrees Baseline: Goal status: IN PROGRESS 01/15/23  2.  Patient will demonstrate full right knee extension Baseline:  Goal status: INITIAL  3.  Patient will ambulate 300 feet with single-point cane showing good safety and weightbearing on the right side Baseline:  Goal status: INITIAL  4.  Patient will be independent with initial exercise program Baseline:  Goal status: IN PROGRESS 01/15/23  LONG TERM GOALS: Target date: 03/08/2023    Patient will go up and down 8 steps with reciprocal gait in order to ambulate in the community Baseline:  Goal status: INITIAL  2.  Patient will ambulate 3000 feet with least restrictive assistive device in order to go shopping and walk her dog Baseline:  Goal status: INITIAL  3.  Patient will be  independent with complete exercise program Baseline:  Goal status: INITIAL  4.  Patient will stand for greater than 1 hour without increased right knee pain in order to perform ADLs and IADLs Baseline:  Goal status: INITIAL   PLAN:  PT FREQUENCY: 2x/week  PT DURATION: 8 weeks  PLANNED INTERVENTIONS: Therapeutic exercises, Therapeutic activity, Neuromuscular re-education, Balance training, Gait training, Patient/Family education, Self Care, Joint mobilization, Stair training, Aquatic Therapy, Dry Needling, Cryotherapy, Moist heat, Ultrasound, and Manual therapy  PLAN FOR NEXT SESSION: begin PROM into flexion and extension. Progress to SLR as tolerated and SAQ; progress standing exercises as tolerated. Start nu-step.    Carolyne Littles PT DPT  01/22/2023, 10:26 AM

## 2023-01-25 ENCOUNTER — Ambulatory Visit (HOSPITAL_BASED_OUTPATIENT_CLINIC_OR_DEPARTMENT_OTHER): Payer: BC Managed Care – PPO | Admitting: Physical Therapy

## 2023-01-25 DIAGNOSIS — R2689 Other abnormalities of gait and mobility: Secondary | ICD-10-CM

## 2023-01-25 DIAGNOSIS — M25561 Pain in right knee: Secondary | ICD-10-CM

## 2023-01-25 DIAGNOSIS — M25661 Stiffness of right knee, not elsewhere classified: Secondary | ICD-10-CM

## 2023-01-25 DIAGNOSIS — M6281 Muscle weakness (generalized): Secondary | ICD-10-CM

## 2023-01-25 DIAGNOSIS — R600 Localized edema: Secondary | ICD-10-CM

## 2023-01-25 NOTE — Therapy (Signed)
OUTPATIENT PHYSICAL THERAPY LOWER EXTREMITY Treatment   Patient Name: Diana Herman MRN: [id number] DOB:[birth year], 57 y.o., female Today's Date: 01/25/2023  END OF SESSION:     Past Medical History:  Diagnosis Date   Achilles rupture, right 08/18/2018   Formatting of this note might be different from the original. 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   Severe sepsis with acute organ dysfunction ([place]) 10/15/2021   Sleep apnea    wears cpap    Past Surgical History:  Procedure Laterality Date   ACHILLES TENDON SURGERY Right    COLONOSCOPY  04/14/2021   [facility]   COLONOSCOPY WITH ESOPHAGOGASTRODUODENOSCOPY (EGD)  01/2022   HYSTERECTOMY     hysteroscopy and polypectomy     POLYPECTOMY     TONSILLECTOMY     WISDOM TOOTH EXTRACTION     early 90's    Patient Active Problem List   Diagnosis Date Noted   Depression with anxiety 10/15/2021   Nausea vomiting and diarrhea 08/13/2021   Type 2 diabetes mellitus without complication ([place]) 99991111   Hx of adenomatous colonic polyps 04/28/2021   Long term current use of systemic steroids 09/21/2020   Primary hypertension 08/30/2020   Morbid obesity ([place]) 01/01/2019   Polymyalgia rheumatica ([place]) 08/27/2017   Primary osteoarthritis of left knee 06/30/2016   Primary osteoarthritis of right knee 06/30/2016   Anxiety 01/17/2016   Impaired  glucose tolerance 01/17/2016   Mixed hyperlipidemia 01/17/2016   OSA on CPAP 01/17/2016    PCP: Elenor Quinones   REFERRING PROVIDER: Jaynee Eagles MD   REFERRING DIAG: right TKA  THERAPY DIAG:  No diagnosis found.  Rationale for Evaluation and Treatment: Rehabilitation  ONSET DATE: 01/08/2023  SUBJECTIVE:   SUBJECTIVE STATEMENT: Pt reports pain has been manageable. Takes pain medicine. Compliant with HEP.   PERTINENT HISTORY: Right achilles repair, polymyalgia rheumatica; OA of both knees, sleep apneas. Depression, anxiety PAIN:  Are you having pain? Yes: NPRS scale: 4/10 sore  Pain location: right knee  Pain description: aching  Aggravating factors: standing and walking  Relieving factors: rest   PRECAUTIONS: Knee  WEIGHT BEARING RESTRICTIONS: Yes WBAT   FALLS:  Has patient fallen in last 6 months? 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   Hobbies:  Play with her dog; walk her dog  PLOF: Independent  PATIENT GOALS:   To be able to do normal activity without increased pain   NEXT MD VISIT:  March 1st    OBJECTIVE:   DIAGNOSTIC FINDINGS: nothing post op  PATIENT SURVEYS:  FOTO    COGNITION: Overall cognitive status: Within functional limits for tasks assessed     SENSATION: Denies paresthesias   EDEMA:  Nothing  significant  MUSCLE LENGTH:  POSTURE: No Significant postural limitations  PALPATION: No unexpected TTP   LOWER EXTREMITY ROM:  Passive ROM Right eval Left eval Right  3/8  Hip flexion     Hip extension     Hip abduction     Hip adduction     Hip internal rotation     Hip external rotation     Knee flexion  85 degrees 100  Knee extension  -10 -4  Ankle dorsiflexion     Ankle plantarflexion     Ankle inversion     Ankle eversion      (Blank rows = not tested)  LOWER EXTREMITY MMT:  MMT Right eval Left eval  Hip flexion    Hip extension    Hip abduction     Hip adduction    Hip internal rotation    Hip external rotation    Knee flexion    Knee extension    Ankle dorsiflexion    Ankle plantarflexion    Ankle inversion    Ankle eversion     (Blank rows = not tested) not tested secondary recent surgery   FUNCTIONAL TESTS:  Bed mobility: Uses arms to lift right leg up onto the bed. Sit to stand transfer: Uses arms to push up. GAIT: Using a rolling walker. Decreased weight bearing on the left. Using UE to take weight off.   TODAY'S TREATMENT:                                                                                                                              DATE:  3/8 R knee PROM Grade II and III PA and AP mobilization for flexion and extension SLR 2x10  LAQ 2x15  Standing heel/toe x20   Step up 4" 2x10  Lateral step 2x10 4"   Gait training: ambulation with single point cane. Minimal cuing required. Patient appeared to be very safe.     3/5 R knee PROM Grade II and III PA and AP mobilization for flexion and extension  Quad set-5" 2x10 SAQ-5" 2x10 Heel slides 5" 5x   LAQ 2x15  Self knee flexion stretch 5x 10 sec hold   Step ups: 2 inch 2x10  Side steps 2x10 2 inch     3/1  R knee PROM Quad set-5" 2x10 SAQ-5" 2x10 Bridge 5" 2x10 Heel slides 5" 5x   Seated knee extension stretch 5x10 sec hold   Gait with FWW-1/2hall and back Gait with SPC (trial with SBA) 1/2 hall and back decreased cuing required. Good reciprocal gait noted   Heel raises 2x10 Standing left legf half march for weight bearing on the rigght x20     PATIENT EDUCATION:  Education details: HEP, symptom management;  Person educated: Patient Education method: Explanation, Demonstration, Tactile cues, Verbal cues, and Handouts Education comprehension: verbalized understanding, returned demonstration, verbal cues required, tactile cues required, and needs further education  HOME EXERCISE PROGRAM: Access Code:  [facility] : [facility] URL:  [email or web address] Date: 01/11/2023 Prepared by: Carolyne Littles   ASSESSMENT:  CLINICAL IMPRESSION: The patient continues to progress very well. Her total arc was measured at 4-100 degrees. We also advanced her step eight today. Her MD recommended 4 more weeks. We will continue to progress functional strengthening. She would also like to build towards a gym program.   OBJECTIVE IMPAIRMENTS: Abnormal gait, decreased activity tolerance, decreased knowledge of use of DME, decreased mobility, difficulty walking, decreased ROM, decreased strength, and pain.   ACTIVITY LIMITATIONS: carrying, lifting, bending, sitting, standing, squatting, sleeping, stairs, transfers, bed mobility, bathing, dressing, and locomotion level  PARTICIPATION LIMITATIONS: meal prep, cleaning, laundry, driving, shopping, community activity, occupation, and yard work  PERSONAL FACTORS: 1-2 comorbidities: polymyalgia rheumatica, anxiety, and depression   are also affecting patient's functional outcome.   REHAB POTENTIAL: Good  CLINICAL DECISION MAKING: Stable/uncomplicated  EVALUATION COMPLEXITY: Low   GOALS: Goals reviewed with patient? Yes  SHORT TERM GOALS: Target date: 02/08/2023   Patient will increase right knee flexion to 110 degrees Baseline: Goal status: IN PROGRESS 01/15/23  2.  Patient will demonstrate full right knee extension Baseline:  Goal status: INITIAL  3.  Patient will ambulate 300 feet with single-point cane showing good safety and weightbearing on the right side Baseline:  Goal status: INITIAL  4.  Patient will be independent with initial exercise program Baseline:  Goal status: IN PROGRESS 01/15/23  LONG TERM GOALS: Target date: 03/08/2023    Patient will go up and down 8 steps with reciprocal gait in order to ambulate in the community Baseline:  Goal status: INITIAL  2.  Patient will ambulate 3000 feet with least restrictive assistive device in order to go  shopping and walk her dog Baseline:  Goal status: INITIAL  3.  Patient will be independent with complete exercise program Baseline:  Goal status: INITIAL  4.  Patient will stand for greater than 1 hour without increased right knee pain in order to perform ADLs and IADLs Baseline:  Goal status: INITIAL   PLAN:  PT FREQUENCY: 2x/week  PT DURATION: 8 weeks  PLANNED INTERVENTIONS: Therapeutic exercises, Therapeutic activity, Neuromuscular re-education, Balance training, Gait training, Patient/Family education, Self Care, Joint mobilization, Stair training, Aquatic Therapy, Dry Needling, Cryotherapy, Moist heat, Ultrasound, and Manual therapy  PLAN FOR NEXT SESSION: begin PROM into flexion and extension. Progress to SLR as tolerated and SAQ; progress standing exercises as tolerated. Start nu-step.    Carolyne Littles PT DPT  01/25/2023, 1:55 PM

## 2023-01-29 ENCOUNTER — Ambulatory Visit (HOSPITAL_BASED_OUTPATIENT_CLINIC_OR_DEPARTMENT_OTHER): Payer: BC Managed Care – PPO | Admitting: Physical Therapy

## 2023-01-29 DIAGNOSIS — R2689 Other abnormalities of gait and mobility: Secondary | ICD-10-CM

## 2023-01-29 DIAGNOSIS — M6281 Muscle weakness (generalized): Secondary | ICD-10-CM | POA: Diagnosis not present

## 2023-01-29 DIAGNOSIS — M25561 Pain in right knee: Secondary | ICD-10-CM

## 2023-01-29 DIAGNOSIS — M25661 Stiffness of right knee, not elsewhere classified: Secondary | ICD-10-CM

## 2023-01-29 DIAGNOSIS — R600 Localized edema: Secondary | ICD-10-CM

## 2023-01-29 IMAGING — CT CT HEAD W/O CM
4 series · 15 of 47 positions shown, 17 images · non-contrast
Comparison: None.

CLINICAL DATA: Pt RUDI JUMPER from home after her brother found her
unresponsive on the floor, she reports est. 4 syncopal episodes
w/LOC in the past 2 days. Pt has had N/V/D for 2 days. EMS reports
the pt was diaphoretic upon arrival with BP of 70/40. Pt is not on
blood thinners

EXAM:
CT HEAD WITHOUT CONTRAST
CT CERVICAL SPINE WITHOUT CONTRAST
TECHNIQUE: Multidetector CT imaging of the head and cervical spine was
performed following the standard protocol without intravenous
contrast. Multiplanar CT image reconstructions of the cervical spine
were also generated.

[Series 3: head without · axial · non-contrast · 0.43mm/px · z∈[-80,+40]mm · 7 of 34 slices shown, 9 images]
[im 5/34  brain]
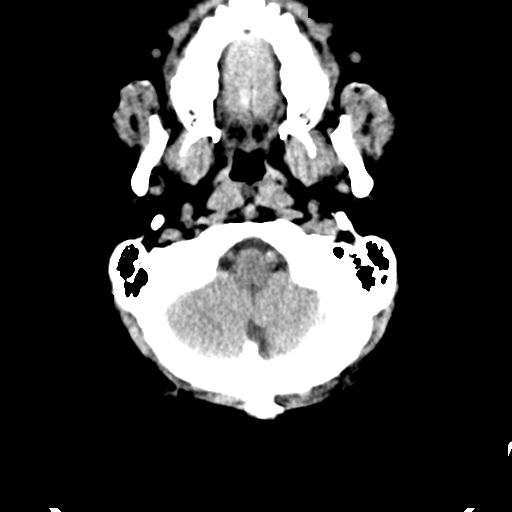
[im 5/34  bone]
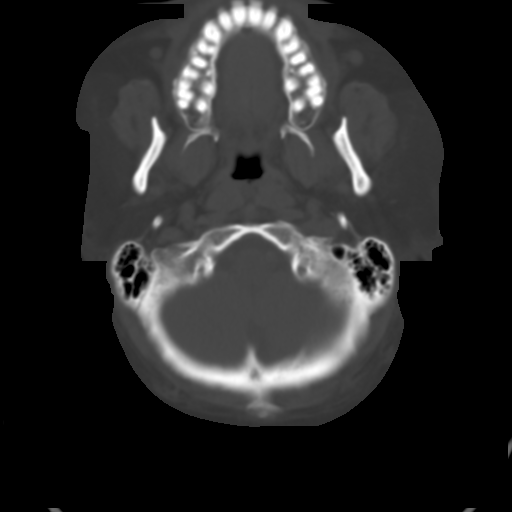
[im 9/34  brain]
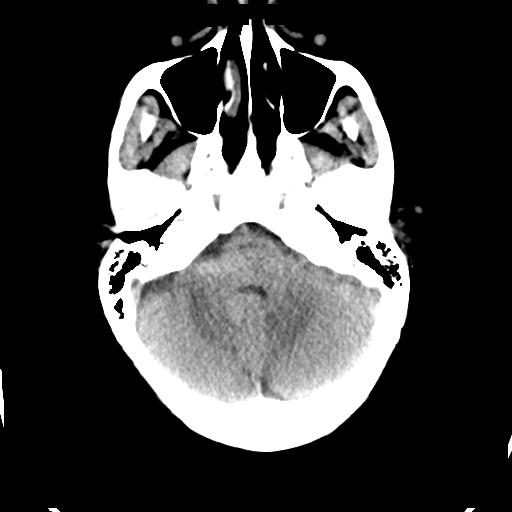
[im 13/34  brain]
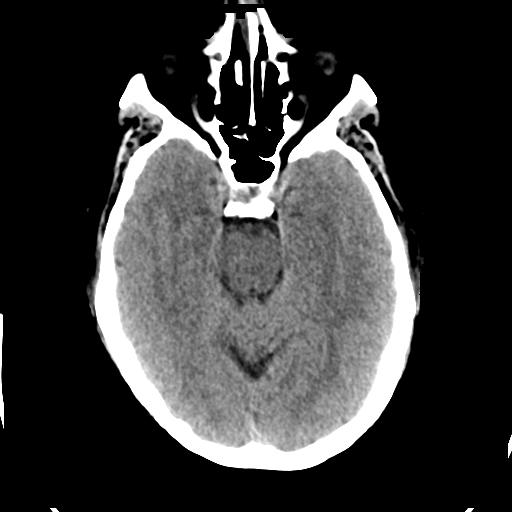
[im 17/34  brain]
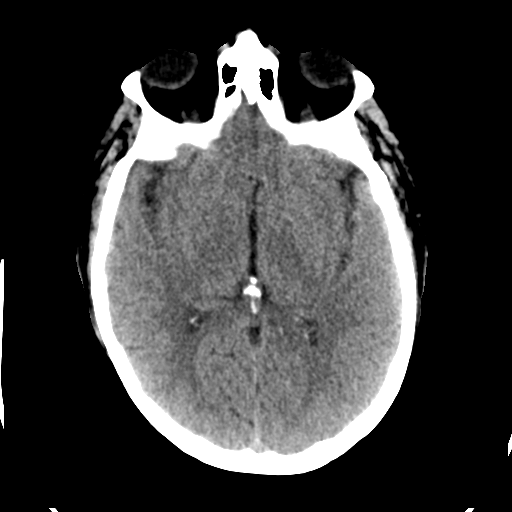
[im 21/34  brain]
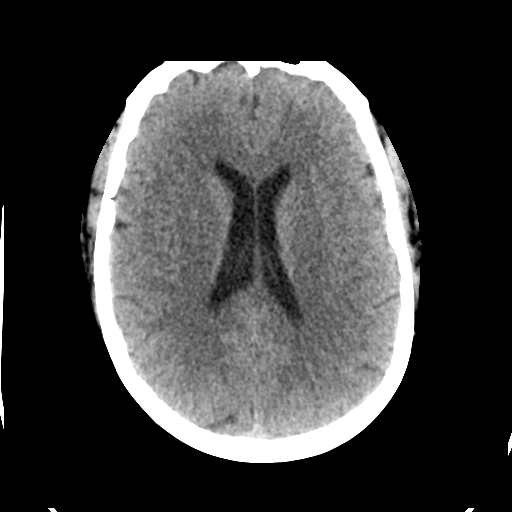
[im 21/34  bone]
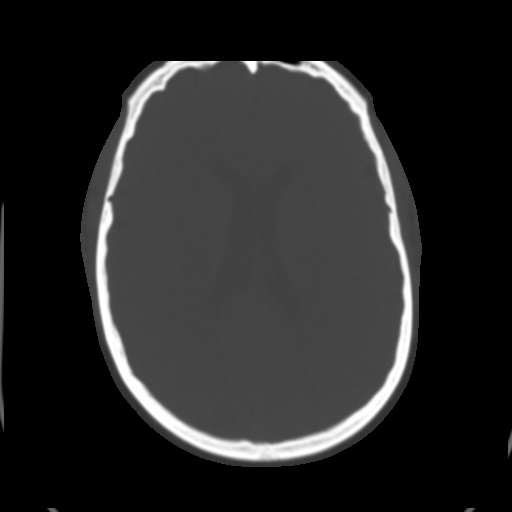
[im 25/34  brain]
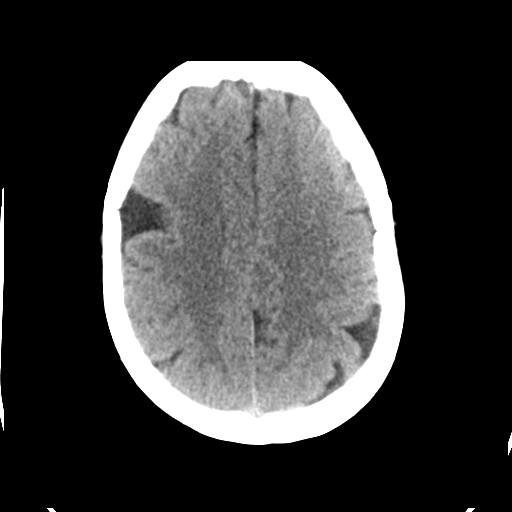
[im 29/34  brain]
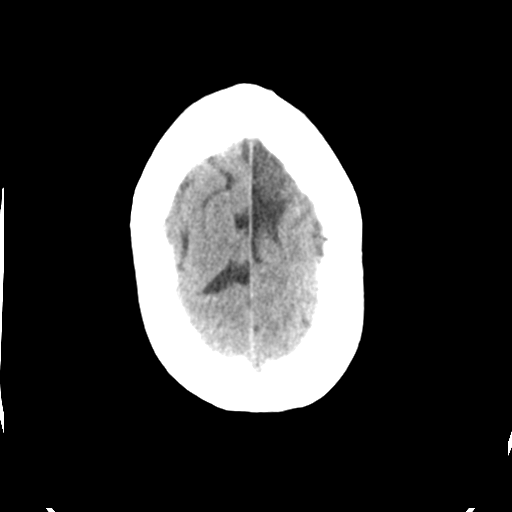

[Series 4: head bone · axial · 0.43mm/px · z∈[-84,-68]mm · 2 of 84 slices shown]
[im 9/84  bone]
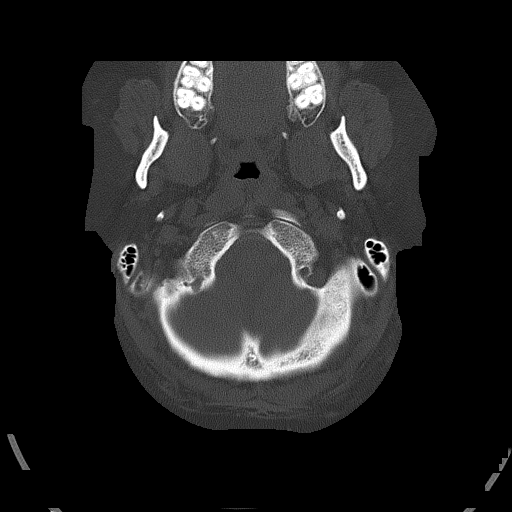
[im 17/84  bone]
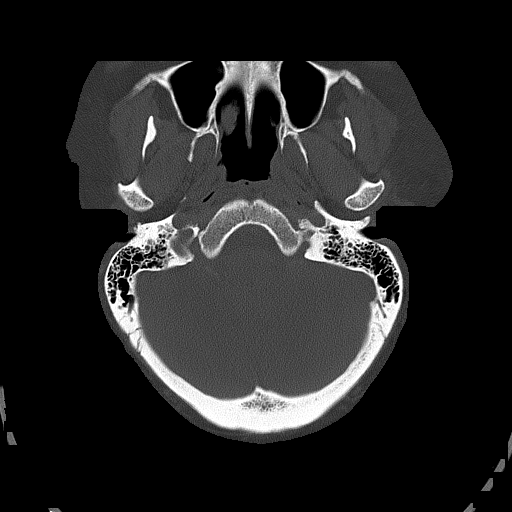

[Series 5: head without cor · coronal · non-contrast · 0.28mm/px · 3 of 70 slices shown]
[im 27/70  brain]
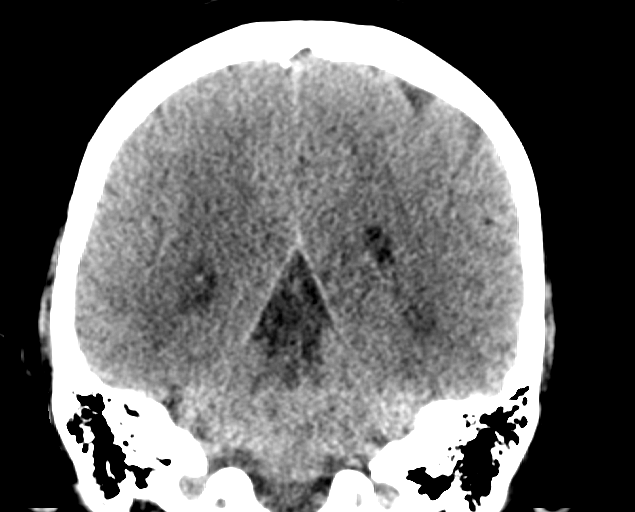
[im 32/70  brain]
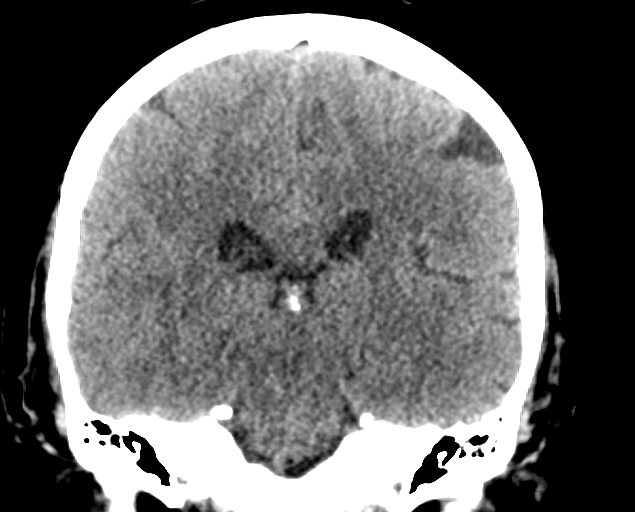
[im 38/70  brain]
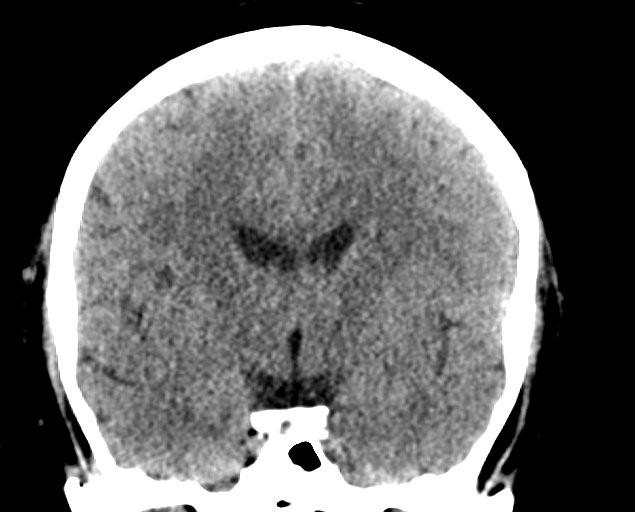

[Series 6: head without sag · sagittal · non-contrast · 0.28mm/px · 3 of 67 slices shown]
[im 23/67  brain]
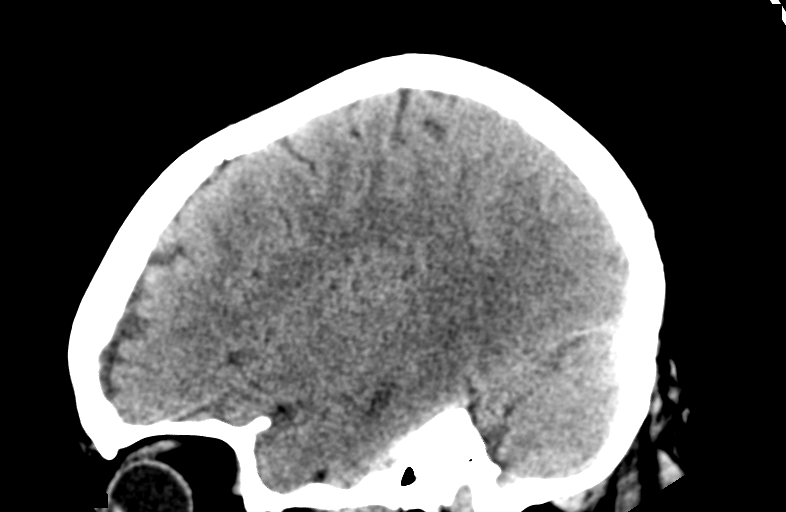
[im 34/67  brain]
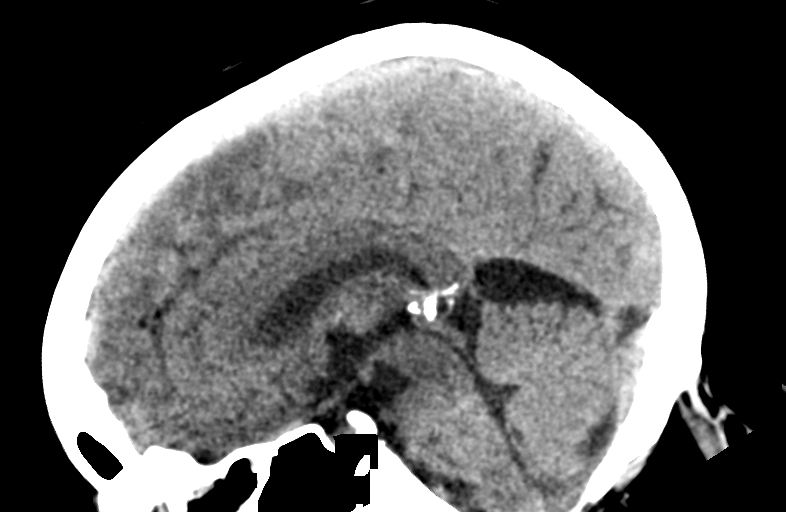
[im 45/67  brain]
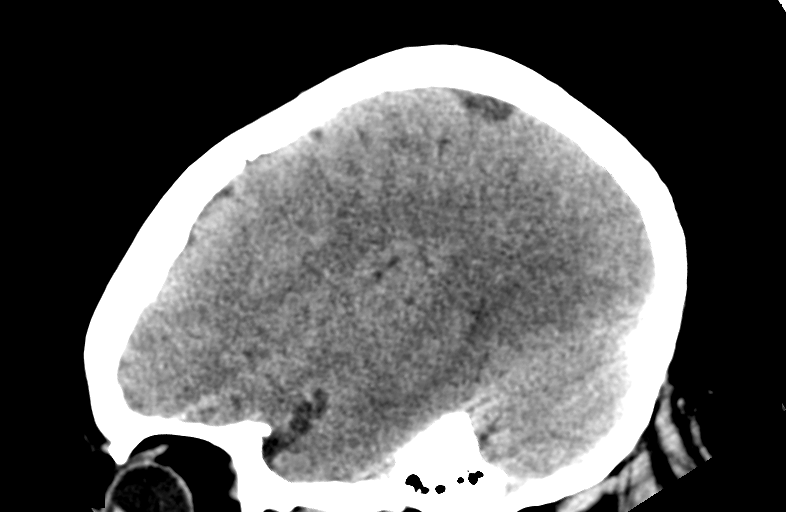

[15 of 47 positions shown; findings below may reference images not displayed]

FINDINGS: CT HEAD FINDINGS

Brain: No evidence of acute infarction, hemorrhage, hydrocephalus,
extra-axial collection or mass lesion/mass effect.

Vascular: No hyperdense vessel or unexpected calcification.

Skull: Normal. Negative for fracture or focal lesion.

Sinuses/Orbits: Normal globes and orbits.  Clear sinuses.

Other: None.

CT CERVICAL SPINE FINDINGS

Alignment: Normal.

Skull base and vertebrae: No acute fracture. No primary bone lesion
or focal pathologic process.

Soft tissues and spinal canal: No prevertebral fluid or swelling. No
visible canal hematoma.

Disc levels: Disc spaces, central spinal canal and neural foramina
are well preserved. No evidence of a disc herniation. Small anterior
endplate osteophytes, C3 through C7. No other degenerative change.

Upper chest: Negative.

Other: None.
IMPRESSION: HEAD CT

1. Normal.

CERVICAL CT

1. No fracture or acute finding.

## 2023-01-30 ENCOUNTER — Encounter (HOSPITAL_BASED_OUTPATIENT_CLINIC_OR_DEPARTMENT_OTHER): Payer: Self-pay | Admitting: Physical Therapy

## 2023-01-30 NOTE — Therapy (Signed)
OUTPATIENT PHYSICAL THERAPY LOWER EXTREMITY Treatment   Patient Name: Diana Herman MRN: [id number] DOB:12-Jan-1966, 57 y.o., female Today's Date: 01/29/2023  END OF SESSION:  PT End of Session - 01/30/23 0821     Visit Number 6    Number of Visits 16    Date for PT Re-Evaluation 03/08/23    Authorization Type BCBS    PT Start Time 1015    PT Stop Time 1056    PT Time Calculation (min) 41 min    Activity Tolerance Patient tolerated treatment well    Behavior During Therapy [facility] for tasks assessed/performed               Past Medical History:  Diagnosis Date   Achilles rupture, right 08/18/2018   Formatting of this note might be different from the original. 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 09/2021   Severe sepsis with acute organ dysfunction ([place]) 10/15/2021   Sleep apnea    wears cpap    Past Surgical History:  Procedure Laterality Date   ACHILLES TENDON SURGERY Right    COLONOSCOPY  04/14/2021   [facility]   COLONOSCOPY WITH ESOPHAGOGASTRODUODENOSCOPY (EGD)  01/2022   HYSTERECTOMY     hysteroscopy and polypectomy     POLYPECTOMY     TONSILLECTOMY     WISDOM TOOTH EXTRACTION     early 90's    Patient Active Problem List   Diagnosis Date Noted   Depression with anxiety 10/15/2021   Nausea vomiting and diarrhea 08/13/2021   Type 2 diabetes mellitus without complication ([place])  99991111   Hx of adenomatous colonic polyps 04/28/2021   Long term current use of systemic steroids 09/21/2020   Primary hypertension 08/30/2020   Morbid obesity ([place]) 01/01/2019   Polymyalgia rheumatica ([place]) 08/27/2017   Primary osteoarthritis of left knee 06/30/2016   Primary osteoarthritis of right knee 06/30/2016   Anxiety 01/17/2016   Impaired glucose tolerance 01/17/2016   Mixed hyperlipidemia 01/17/2016   OSA on CPAP 01/17/2016    PCP: Elenor Quinones   REFERRING PROVIDER: Jaynee Eagles MD   REFERRING DIAG: right TKA  THERAPY DIAG:  Muscle weakness (generalized)  Other abnormalities of gait and mobility  Localized edema  Acute pain of right knee  Stiffness of right knee, not elsewhere classified  Rationale for Evaluation and Treatment: Rehabilitation  ONSET DATE: 01/08/2023  SUBJECTIVE:   SUBJECTIVE STATEMENT: The patient has swtiched to the cane full time. She has no complaints.   PERTINENT HISTORY: Right achilles repair, polymyalgia rheumatica; OA of both knees, sleep apneas. Depression, anxiety PAIN:  Are you having pain? Yes: NPRS scale: 4/10 sore  Pain location: right knee  Pain description: aching  Aggravating factors: standing and walking  Relieving factors: rest   PRECAUTIONS: Knee  WEIGHT BEARING RESTRICTIONS: Yes WBAT   FALLS:  Has patient fallen in last 6 months? 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   Hobbies:  Play with her dog; walk her dog  PLOF: Independent  PATIENT GOALS:   To be able to do normal activity without increased pain   NEXT MD VISIT:  March 1st    OBJECTIVE:   DIAGNOSTIC FINDINGS: nothing post op  PATIENT SURVEYS:  FOTO    COGNITION: Overall cognitive status: Within functional limits for tasks assessed     SENSATION: Denies paresthesias   EDEMA:  Nothing significant  MUSCLE LENGTH:  POSTURE: No Significant postural  limitations  PALPATION: No unexpected TTP   LOWER EXTREMITY ROM:  Passive ROM Right eval Left eval Right  3/8  Hip flexion     Hip extension     Hip abduction     Hip adduction     Hip internal rotation     Hip external rotation     Knee flexion  85 degrees 100  Knee extension  -10 -4  Ankle dorsiflexion     Ankle plantarflexion     Ankle inversion     Ankle eversion      (Blank rows = not tested)  LOWER EXTREMITY MMT:  MMT Right eval Left eval  Hip flexion    Hip extension    Hip abduction    Hip adduction    Hip internal rotation    Hip external rotation    Knee flexion    Knee extension    Ankle dorsiflexion    Ankle plantarflexion    Ankle inversion    Ankle eversion     (Blank rows = not tested) not tested secondary recent surgery   FUNCTIONAL TESTS:  Bed mobility: Uses arms to lift right leg up onto the bed. Sit to stand transfer: Uses arms to push up. GAIT: Using a rolling walker. Decreased weight bearing on the left. Using UE to take weight off.   TODAY'S TREATMENT:                                                                                                                              DATE:  3/13 R knee PROM Grade II and III PA and AP mobilization for flexion and extension SLR 2x10  LAQ 2x15  Standing heel/toe x20  Gait: reviewed ambulation with a cane She walks with a stiff leg. She was advised this is normal. As she becomes more confident in the leg she will naturally start to bend it more. We worked on some unlocked weight shifting at the counter.    3/8 R knee PROM Grade II and III PA and AP mobilization for flexion and extension SLR 2x10  LAQ 2x15  Standing heel/toe x20   Step up 4" 2x10  Lateral step 2x10 4"   Gait training: ambulation with single point  cane. Minimal cuing required. Patient appeared to be very safe.     3/5 R knee PROM Grade II and III PA and AP mobilization for flexion and extension  Quad set-5"  2x10 SAQ-5" 2x10 Heel slides 5" 5x   LAQ 2x15  Self knee flexion stretch 5x 10 sec hold   Step ups: 2 inch 2x10  Side steps 2x10 2 inch     3/1  R knee PROM Quad set-5" 2x10 SAQ-5" 2x10 Bridge 5" 2x10 Heel slides 5" 5x   Seated knee extension stretch 5x10 sec hold   Gait with FWW-1/2hall and back Gait with SPC (trial with SBA) 1/2 hall and back decreased cuing required. Good reciprocal gait noted   Heel raises 2x10 Standing left legf half march for weight bearing on the rigght x20     PATIENT EDUCATION:  Education details: HEP, symptom management;  Person educated: Patient Education method: Explanation, Demonstration, Tactile cues, Verbal cues, and Handouts Education comprehension: verbalized understanding, returned demonstration, verbal cues required, tactile cues required, and needs further education  HOME EXERCISE PROGRAM: Access Code: [facility] URL: [email or web address] Date: 01/11/2023 Prepared by: Carolyne Littles   ASSESSMENT:  CLINICAL IMPRESSION: Total arc measured at 0-104 today. We added in slow standing march today. We also worked on gait with the cane. She tends to lock her knee straight. She was advised as she strengthens, she will start to trust her leg more. We will continue to work on steps next visit.   OBJECTIVE IMPAIRMENTS: Abnormal gait, decreased activity tolerance, decreased knowledge of use of DME, decreased mobility, difficulty walking, decreased ROM, decreased strength, and pain.   ACTIVITY LIMITATIONS: carrying, lifting, bending, sitting, standing, squatting, sleeping, stairs, transfers, bed mobility, bathing, dressing, and locomotion level  PARTICIPATION LIMITATIONS: meal prep, cleaning, laundry, driving, shopping, community activity, occupation, and yard work  PERSONAL FACTORS: 1-2 comorbidities: polymyalgia rheumatica, anxiety, and depression   are also affecting patient's functional outcome.   REHAB POTENTIAL:  Good  CLINICAL DECISION MAKING: Stable/uncomplicated  EVALUATION COMPLEXITY: Low   GOALS: Goals reviewed with patient? Yes  SHORT TERM GOALS: Target date: 02/08/2023   Patient will increase right knee flexion to 110 degrees Baseline: Goal status: IN PROGRESS 01/15/23  2.  Patient will demonstrate full right knee extension Baseline:  Goal status: INITIAL  3.  Patient will ambulate 300 feet with single-point cane showing good safety and weightbearing on the right side Baseline:  Goal status: INITIAL  4.  Patient will be independent with initial exercise program Baseline:  Goal status: IN PROGRESS 01/15/23  LONG TERM GOALS: Target date: 03/08/2023    Patient will go up and down 8 steps with reciprocal gait in order to ambulate in the community Baseline:  Goal status: INITIAL  2.  Patient will ambulate 3000 feet with least restrictive assistive device in order to go shopping and walk her dog Baseline:  Goal status: INITIAL  3.  Patient will be independent with complete exercise program Baseline:  Goal status: INITIAL  4.  Patient will stand for greater than 1 hour without increased right knee pain in order to perform ADLs and IADLs Baseline:  Goal status: INITIAL   PLAN:  PT FREQUENCY: 2x/week  PT DURATION: 8 weeks  PLANNED INTERVENTIONS: Therapeutic exercises, Therapeutic activity, Neuromuscular re-education, Balance training, Gait training, Patient/Family education, Self Care, Joint mobilization, Stair training, Aquatic Therapy, Dry Needling, Cryotherapy, Moist heat, Ultrasound, and Manual therapy  PLAN FOR NEXT SESSION: begin PROM into flexion and extension. Progress to SLR as tolerated and  SAQ; progress standing exercises as tolerated. Start nu-step.    Carolyne Littles PT DPT  01/30/2023, 8:56 AM

## 2023-02-01 ENCOUNTER — Encounter (HOSPITAL_BASED_OUTPATIENT_CLINIC_OR_DEPARTMENT_OTHER): Payer: Self-pay | Admitting: Physical Therapy

## 2023-02-01 ENCOUNTER — Ambulatory Visit (HOSPITAL_BASED_OUTPATIENT_CLINIC_OR_DEPARTMENT_OTHER): Payer: BC Managed Care – PPO | Admitting: Physical Therapy

## 2023-02-01 DIAGNOSIS — M25561 Pain in right knee: Secondary | ICD-10-CM

## 2023-02-01 DIAGNOSIS — M6281 Muscle weakness (generalized): Secondary | ICD-10-CM | POA: Diagnosis not present

## 2023-02-01 DIAGNOSIS — M25661 Stiffness of right knee, not elsewhere classified: Secondary | ICD-10-CM

## 2023-02-01 DIAGNOSIS — R2689 Other abnormalities of gait and mobility: Secondary | ICD-10-CM

## 2023-02-01 DIAGNOSIS — R600 Localized edema: Secondary | ICD-10-CM

## 2023-02-01 NOTE — Therapy (Signed)
OUTPATIENT PHYSICAL THERAPY LOWER EXTREMITY Treatment   Patient Name: Diana Herman MRN: [id number] DOB:12-Jan-1966, 57 y.o., female Today's Date: 01/29/2023  END OF SESSION:  PT End of Session - 01/30/23 0821     Visit Number 6    Number of Visits 16    Date for PT Re-Evaluation 03/08/23    Authorization Type BCBS    PT Start Time 1015    PT Stop Time 1056    PT Time Calculation (min) 41 min    Activity Tolerance Patient tolerated treatment well    Behavior During Therapy [facility] for tasks assessed/performed               Past Medical History:  Diagnosis Date   Achilles rupture, right 08/18/2018   Formatting of this note might be different from the original. 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 09/2021   Severe sepsis with acute organ dysfunction ([place]) 10/15/2021   Sleep apnea    wears cpap    Past Surgical History:  Procedure Laterality Date   ACHILLES TENDON SURGERY Right    COLONOSCOPY  04/14/2021   [facility]   COLONOSCOPY WITH ESOPHAGOGASTRODUODENOSCOPY (EGD)  01/2022   HYSTERECTOMY     hysteroscopy and polypectomy     POLYPECTOMY     TONSILLECTOMY     WISDOM TOOTH EXTRACTION     early 90's    Patient Active Problem List   Diagnosis Date Noted   Depression with anxiety 10/15/2021   Nausea vomiting and diarrhea 08/13/2021   Type 2 diabetes mellitus without complication ([place])  99991111   Hx of adenomatous colonic polyps 04/28/2021   Long term current use of systemic steroids 09/21/2020   Primary hypertension 08/30/2020   Morbid obesity ([place]) 01/01/2019   Polymyalgia rheumatica ([place]) 08/27/2017   Primary osteoarthritis of left knee 06/30/2016   Primary osteoarthritis of right knee 06/30/2016   Anxiety 01/17/2016   Impaired glucose tolerance 01/17/2016   Mixed hyperlipidemia 01/17/2016   OSA on CPAP 01/17/2016    PCP: Elenor Quinones   REFERRING PROVIDER: Jaynee Eagles MD   REFERRING DIAG: right TKA  THERAPY DIAG:  Muscle weakness (generalized)  Other abnormalities of gait and mobility  Localized edema  Acute pain of right knee  Stiffness of right knee, not elsewhere classified  Rationale for Evaluation and Treatment: Rehabilitation  ONSET DATE: 01/08/2023  SUBJECTIVE:   SUBJECTIVE STATEMENT: The patient has swtiched to the cane full time. She has no complaints.   PERTINENT HISTORY: Right achilles repair, polymyalgia rheumatica; OA of both knees, sleep apneas. Depression, anxiety PAIN:  Are you having pain? Yes: NPRS scale: 4/10 sore  Pain location: right knee  Pain description: aching  Aggravating factors: standing and walking  Relieving factors: rest   PRECAUTIONS: Knee  WEIGHT BEARING RESTRICTIONS: Yes WBAT   FALLS:  Has patient fallen in last 6 months? 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   Hobbies:  Play with her dog; walk her dog  PLOF: Independent  PATIENT GOALS:   To be able to do normal activity without increased pain   NEXT MD VISIT:  March 1st    OBJECTIVE:   DIAGNOSTIC FINDINGS: nothing post op  PATIENT SURVEYS:  FOTO    COGNITION: Overall cognitive status: Within functional limits for tasks assessed     SENSATION: Denies paresthesias   EDEMA:  Nothing significant  MUSCLE LENGTH:  POSTURE: No Significant postural  limitations  PALPATION: No unexpected TTP   LOWER EXTREMITY ROM:  Passive ROM Right eval Left eval Right  3/8  Hip flexion     Hip extension     Hip abduction     Hip adduction     Hip internal rotation     Hip external rotation     Knee flexion  85 degrees 100  Knee extension  -10 -4  Ankle dorsiflexion     Ankle plantarflexion     Ankle inversion     Ankle eversion      (Blank rows = not tested)  LOWER EXTREMITY MMT:  MMT Right eval Left eval  Hip flexion    Hip extension    Hip abduction    Hip adduction    Hip internal rotation    Hip external rotation    Knee flexion    Knee extension    Ankle dorsiflexion    Ankle plantarflexion    Ankle inversion    Ankle eversion     (Blank rows = not tested) not tested secondary recent surgery   FUNCTIONAL TESTS:  Bed mobility: Uses arms to lift right leg up onto the bed. Sit to stand transfer: Uses arms to push up. GAIT: Using a rolling walker. Decreased weight bearing on the left. Using UE to take weight off.   TODAY'S TREATMENT:                                                                                                                              DATE:  3/15 R knee PROM Grade II and III PA and AP mobilization for flexion and extension SLR 3x10 1.5 lbs  LAQ 3x15  Step up 2 inch x20  Lateral step up 2 inch x20   Standing slow march x20   Standing heel/toe x15 but felt some pain   3/13 R knee PROM Grade II and III PA and AP mobilization for flexion and extension SLR 2x10  LAQ 2x15  Standing heel/toe x20  Gait: reviewed ambulation with a cane She walks with a stiff leg. She was advised this is normal. As she becomes more confident in the leg she will naturally start to bend it more. We worked on some  unlocked weight shifting at the counter.    3/8 R knee PROM Grade II and III PA and AP mobilization for flexion and extension SLR 2x10  LAQ 2x15  Standing heel/toe x20   Step up 4" 2x10   Lateral step 2x10 4"   Gait training: ambulation with single point cane. Minimal cuing required. Patient appeared to be very safe.     3/5 R knee PROM Grade II and III PA and AP mobilization for flexion and extension  Quad set-5" 2x10 SAQ-5" 2x10 Heel slides 5" 5x   LAQ 2x15  Self knee flexion stretch 5x 10 sec hold   Step ups: 2 inch 2x10  Side steps 2x10 2 inch     3/1  R knee PROM Quad set-5" 2x10 SAQ-5" 2x10 Bridge 5" 2x10 Heel slides 5" 5x   Seated knee extension stretch 5x10 sec hold   Gait with FWW-1/2hall and back Gait with SPC (trial with SBA) 1/2 hall and back decreased cuing required. Good reciprocal gait noted   Heel raises 2x10 Standing left legf half march for weight bearing on the rigght x20     PATIENT EDUCATION:  Education details: HEP, symptom management;  Person educated: Patient Education method: Explanation, Demonstration, Tactile cues, Verbal cues, and Handouts Education comprehension: verbalized understanding, returned demonstration, verbal cues required, tactile cues required, and needs further education  HOME EXERCISE PROGRAM: Access Code: [facility] URL: [email or web address] Date: 01/11/2023 Prepared by: Carolyne Littles   ASSESSMENT:  CLINICAL IMPRESSION: The patient continues to make progress with ROM and strength. We added light weight to her SLR and LAQ today. We worked on a low staep 2nd to baseline soreness. Her extension is still  little limited. Therapy will continue to progress as tolerated. She is no longer using the cane. She was advised that is fine but if she is sore the cane can take some stress off the knee.  OBJECTIVE IMPAIRMENTS: Abnormal gait, decreased activity tolerance, decreased knowledge of use of DME, decreased mobility, difficulty walking, decreased ROM, decreased strength, and pain.   ACTIVITY LIMITATIONS: carrying, lifting, bending, sitting, standing, squatting, sleeping, stairs, transfers, bed  mobility, bathing, dressing, and locomotion level  PARTICIPATION LIMITATIONS: meal prep, cleaning, laundry, driving, shopping, community activity, occupation, and yard work  PERSONAL FACTORS: 1-2 comorbidities: polymyalgia rheumatica, anxiety, and depression   are also affecting patient's functional outcome.   REHAB POTENTIAL: Good  CLINICAL DECISION MAKING: Stable/uncomplicated  EVALUATION COMPLEXITY: Low   GOALS: Goals reviewed with patient? Yes  SHORT TERM GOALS: Target date: 02/08/2023   Patient will increase right knee flexion to 110 degrees Baseline: Goal status: IN PROGRESS 01/15/23  2.  Patient will demonstrate full right knee extension Baseline:  Goal status: INITIAL  3.  Patient will ambulate 300 feet with single-point cane showing good safety and weightbearing on the right side Baseline:  Goal status: INITIAL  4.  Patient will be independent with initial exercise program Baseline:  Goal status: IN PROGRESS 01/15/23  LONG TERM GOALS: Target date: 03/08/2023    Patient will go up and down 8 steps with reciprocal gait in order to ambulate in the community Baseline:  Goal status: INITIAL  2.  Patient will ambulate 3000 feet with least restrictive assistive device in order to go shopping and walk her dog Baseline:  Goal status: INITIAL  3.  Patient will be independent with complete exercise program Baseline:  Goal status: INITIAL  4.  Patient will stand for greater than 1 hour without increased right knee pain in  order to perform ADLs and IADLs Baseline:  Goal status: INITIAL   PLAN:  PT FREQUENCY: 2x/week  PT DURATION: 8 weeks  PLANNED INTERVENTIONS: Therapeutic exercises, Therapeutic activity, Neuromuscular re-education, Balance training, Gait training, Patient/Family education, Self Care, Joint mobilization, Stair training, Aquatic Therapy, Dry Needling, Cryotherapy, Moist heat, Ultrasound, and Manual therapy  PLAN FOR NEXT SESSION: begin PROM into  flexion and extension. Progress to SLR as tolerated and SAQ; progress standing exercises as tolerated. Start nu-step.    Carolyne Littles PT DPT  01/30/2023, 8:56 AM

## 2023-02-05 ENCOUNTER — Encounter (HOSPITAL_BASED_OUTPATIENT_CLINIC_OR_DEPARTMENT_OTHER): Payer: Self-pay | Admitting: Physical Therapy

## 2023-02-05 ENCOUNTER — Ambulatory Visit (HOSPITAL_BASED_OUTPATIENT_CLINIC_OR_DEPARTMENT_OTHER): Payer: BC Managed Care – PPO | Admitting: Physical Therapy

## 2023-02-05 DIAGNOSIS — M6281 Muscle weakness (generalized): Secondary | ICD-10-CM | POA: Diagnosis not present

## 2023-02-05 DIAGNOSIS — R2689 Other abnormalities of gait and mobility: Secondary | ICD-10-CM

## 2023-02-05 DIAGNOSIS — M25561 Pain in right knee: Secondary | ICD-10-CM

## 2023-02-05 DIAGNOSIS — M25661 Stiffness of right knee, not elsewhere classified: Secondary | ICD-10-CM

## 2023-02-05 DIAGNOSIS — R600 Localized edema: Secondary | ICD-10-CM

## 2023-02-05 NOTE — Therapy (Unsigned)
OUTPATIENT PHYSICAL THERAPY LOWER EXTREMITY Treatment   Patient Name: Diana Herman MRN: [id number] DOB:30-May-1966, 57 y.o., female Today's Date: 01/29/2023  END OF SESSION:  PT End of Session - 01/30/23 0821     Visit Number 6    Number of Visits 16    Date for PT Re-Evaluation 03/08/23    Authorization Type [employer]    PT Start Time 1015    PT Stop Time 1056    PT Time Calculation (min) 41 min    Activity Tolerance Patient tolerated treatment well    Behavior During Therapy [facility] for tasks assessed/performed               Past Medical History:  Diagnosis Date   Achilles rupture, right 08/18/2018   Formatting of this note might be different from the original. 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   Severe sepsis with acute organ dysfunction (HCC) 10/15/2021   Sleep apnea    wears cpap    Past Surgical History:  Procedure Laterality Date   ACHILLES TENDON SURGERY Right    COLONOSCOPY  04/14/2021   [facility]   COLONOSCOPY WITH ESOPHAGOGASTRODUODENOSCOPY (EGD)  01/2022   HYSTERECTOMY     hysteroscopy and polypectomy     POLYPECTOMY     TONSILLECTOMY     WISDOM TOOTH EXTRACTION     early 90's    Patient Active Problem List   Diagnosis Date Noted   Depression with anxiety 10/15/2021   Nausea vomiting and diarrhea 08/13/2021   Type 2 diabetes mellitus without complication (HCC)  08/13/2021   Hx of adenomatous colonic polyps 04/28/2021   Long term current use of systemic steroids 09/21/2020   Primary hypertension 08/30/2020   Morbid obesity (HCC) 01/01/2019   Polymyalgia rheumatica (HCC) 08/27/2017   Primary osteoarthritis of left knee 06/30/2016   Primary osteoarthritis of right knee 06/30/2016   Anxiety 01/17/2016   Impaired glucose tolerance 01/17/2016   Mixed hyperlipidemia 01/17/2016   OSA on CPAP 01/17/2016    PCP: Pricilla Holm   REFERRING PROVIDER: Darnell Level MD   REFERRING DIAG: right TKA  THERAPY DIAG:  Muscle weakness (generalized)  Other abnormalities of gait and mobility  Localized edema  Acute pain of right knee  Stiffness of right knee, not elsewhere classified  Rationale for Evaluation and Treatment: Rehabilitation  ONSET DATE: 01/08/2023  SUBJECTIVE:   SUBJECTIVE STATEMENT: The patient woke up last night what she thinks is gout in her toe. She is having difficulty weight bearing on her foot through that toe.  PERTINENT HISTORY: Right achilles repair, polymyalgia rheumatica; OA of both knees, sleep apneas. Depression, anxiety PAIN:  Are you having pain? Yes: NPRS scale: 8/10 sore  Pain location: right great toe  Pain description: aching  Aggravating factors: standing and walking  Relieving factors: rest   PRECAUTIONS: Knee  WEIGHT BEARING RESTRICTIONS: Yes WBAT   FALLS:  Has patient fallen in last 6 months? 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   Hobbies:  Play with her dog; walk her dog  PLOF: Independent  PATIENT GOALS:   To be able to do normal activity without increased pain   NEXT MD VISIT:  March 1st    OBJECTIVE:   DIAGNOSTIC FINDINGS: nothing post op  PATIENT SURVEYS:  FOTO    COGNITION: Overall cognitive status: Within functional limits for tasks assessed     SENSATION: Denies paresthesias   EDEMA:  Nothing significant   MUSCLE LENGTH:  POSTURE: No Significant postural limitations  PALPATION: No unexpected TTP   LOWER EXTREMITY ROM:  Passive ROM Right eval Left eval Right  3/8  Hip flexion     Hip extension     Hip abduction     Hip adduction     Hip internal rotation     Hip external rotation     Knee flexion  85 degrees 100  Knee extension  -10 -4  Ankle dorsiflexion     Ankle plantarflexion     Ankle inversion     Ankle eversion      (Blank rows = not tested)  LOWER EXTREMITY MMT:  MMT Right eval Left eval  Hip flexion    Hip extension    Hip abduction    Hip adduction    Hip internal rotation    Hip external rotation    Knee flexion    Knee extension    Ankle dorsiflexion    Ankle plantarflexion    Ankle inversion    Ankle eversion     (Blank rows = not tested) not tested secondary recent surgery   FUNCTIONAL TESTS:  Bed mobility: Uses arms to lift right leg up onto the bed. Sit to stand transfer: Uses arms to push up. GAIT: Using a rolling walker. Decreased weight bearing on the left. Using UE to take weight off.   TODAY'S TREATMENT:                                                                                                                              DATE:  3/19 R knee PROM Grade II and III PA and AP mobilization for flexion and extension SLR 3x10  SAQ 3x10  LAQ 3x15   3/15 R knee PROM Grade II and III PA and AP mobilization for flexion and extension SLR 3x10 1.5 lbs  LAQ 3x15  Step up 2 inch x20  Lateral step up 2 inch x20   Standing slow march x20   Standing heel/toe x15 but felt some pain   3/13 R knee PROM Grade II and III PA and AP mobilization for flexion and extension SLR 2x10  LAQ 2x15  Standing heel/toe  x20  Gait: reviewed ambulation with a cane She walks with a stiff leg. She was advised this is normal. As she becomes more confident in the leg she will naturally start to bend it more. We worked on some unlocked weight shifting at  the counter.    3/8 R knee PROM Grade II and III PA and AP mobilization for flexion and extension SLR 2x10  LAQ 2x15  Standing heel/toe x20   Step up 4" 2x10  Lateral step 2x10 4"   Gait training: ambulation with single point cane. Minimal cuing required. Patient appeared to be very safe.     3/5 R knee PROM Grade II and III PA and AP mobilization for flexion and extension  Quad set-5" 2x10 SAQ-5" 2x10 Heel slides 5" 5x   LAQ 2x15  Self knee flexion stretch 5x 10 sec hold   Step ups: 2 inch 2x10  Side steps 2x10 2 inch     3/1  R knee PROM Quad set-5" 2x10 SAQ-5" 2x10 Bridge 5" 2x10 Heel slides 5" 5x   Seated knee extension stretch 5x10 sec hold   Gait with FWW-1/2hall and back Gait with SPC (trial with SBA) 1/2 hall and back decreased cuing required. Good reciprocal gait noted   Heel raises 2x10 Standing left legf half march for weight bearing on the rigght x20     PATIENT EDUCATION:  Education details: HEP, symptom management;  Person educated: Patient Education method: Explanation, Demonstration, Tactile cues, Verbal cues, and Handouts Education comprehension: verbalized understanding, returned demonstration, verbal cues required, tactile cues required, and needs further education  HOME EXERCISE PROGRAM: Access Code: [facility] URL: [email or web address] Date: 01/11/2023 Prepared by: Lorayne Bender   ASSESSMENT:  CLINICAL IMPRESSION:  The patient's range is improving. Her total arc was measured at 4-111. We avoided weight bearing exercises 2nd to significant pain in her big toe. She will be talking to her MD soon. At that point we will progress her exercises as long as her toe starts doing better. She will get an appointment off the wait list later in the week if she gets on medication or the pain resolves.   OBJECTIVE IMPAIRMENTS: Abnormal gait, decreased activity tolerance, decreased knowledge of use of DME, decreased mobility,  difficulty walking, decreased ROM, decreased strength, and pain.   ACTIVITY LIMITATIONS: carrying, lifting, bending, sitting, standing, squatting, sleeping, stairs, transfers, bed mobility, bathing, dressing, and locomotion level  PARTICIPATION LIMITATIONS: meal prep, cleaning, laundry, driving, shopping, community activity, occupation, and yard work  PERSONAL FACTORS: 1-2 comorbidities: polymyalgia rheumatica, anxiety, and depression   are also affecting patient's functional outcome.   REHAB POTENTIAL: Good  CLINICAL DECISION MAKING: Stable/uncomplicated  EVALUATION COMPLEXITY: Low   GOALS: Goals reviewed with patient? Yes  SHORT TERM GOALS: Target date: 02/08/2023   Patient will increase right knee flexion to 110 degrees Baseline: Goal status: IN PROGRESS 01/15/23  2.  Patient will demonstrate full right knee extension Baseline:  Goal status: INITIAL  3.  Patient will ambulate 300 feet with single-point cane showing good safety and weightbearing on the right side Baseline:  Goal status: INITIAL  4.  Patient will be independent with initial exercise program Baseline:  Goal status: IN PROGRESS 01/15/23  LONG TERM GOALS: Target date: 03/08/2023    Patient will go up and down 8 steps with reciprocal gait in order to ambulate in the community Baseline:  Goal status: INITIAL  2.  Patient will ambulate 3000 feet with least restrictive assistive device in order to go shopping  and walk her dog Baseline:  Goal status: INITIAL  3.  Patient will be independent with complete exercise program Baseline:  Goal status: INITIAL  4.  Patient will stand for greater than 1 hour without increased right knee pain in order to perform ADLs and IADLs Baseline:  Goal status: INITIAL   PLAN:  PT FREQUENCY: 2x/week  PT DURATION: 8 weeks  PLANNED INTERVENTIONS: Therapeutic exercises, Therapeutic activity, Neuromuscular re-education, Balance training, Gait training, Patient/Family  education, Self Care, Joint mobilization, Stair training, Aquatic Therapy, Dry Needling, Cryotherapy, Moist heat, Ultrasound, and Manual therapy  PLAN FOR NEXT SESSION: begin PROM into flexion and extension. Progress to SLR as tolerated and SAQ; progress standing exercises as tolerated. Start nu-step.    Lorayne Bender PT DPT  01/30/2023, 8:56 AM

## 2023-02-06 ENCOUNTER — Encounter (HOSPITAL_BASED_OUTPATIENT_CLINIC_OR_DEPARTMENT_OTHER): Payer: Self-pay | Admitting: Physical Therapy

## 2023-02-14 ENCOUNTER — Ambulatory Visit (HOSPITAL_BASED_OUTPATIENT_CLINIC_OR_DEPARTMENT_OTHER): Payer: BC Managed Care – PPO | Admitting: Physical Therapy

## 2023-02-14 ENCOUNTER — Encounter (HOSPITAL_BASED_OUTPATIENT_CLINIC_OR_DEPARTMENT_OTHER): Payer: Self-pay | Admitting: Physical Therapy

## 2023-02-14 DIAGNOSIS — R600 Localized edema: Secondary | ICD-10-CM

## 2023-02-14 DIAGNOSIS — M25561 Pain in right knee: Secondary | ICD-10-CM

## 2023-02-14 DIAGNOSIS — R2689 Other abnormalities of gait and mobility: Secondary | ICD-10-CM

## 2023-02-14 DIAGNOSIS — M6281 Muscle weakness (generalized): Secondary | ICD-10-CM

## 2023-02-14 DIAGNOSIS — M25661 Stiffness of right knee, not elsewhere classified: Secondary | ICD-10-CM

## 2023-02-14 NOTE — Therapy (Signed)
OUTPATIENT PHYSICAL THERAPY LOWER EXTREMITY Treatment   Patient Name: Diana Herman MRN: [id number] DOB:1966-03-23, 57 y.o., female Today's Date: 01/29/2023  END OF SESSION:      Past Medical History:  Diagnosis Date   Achilles rupture, right 08/18/2018   Formatting of this note might be different from the original. 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   Severe sepsis with acute organ dysfunction ([place]) 10/15/2021   Sleep apnea    wears cpap    Past Surgical History:  Procedure Laterality Date   ACHILLES TENDON SURGERY Right    COLONOSCOPY  04/14/2021   [facility]   COLONOSCOPY WITH ESOPHAGOGASTRODUODENOSCOPY (EGD)  01/2022   HYSTERECTOMY     hysteroscopy and polypectomy     POLYPECTOMY     TONSILLECTOMY     WISDOM TOOTH EXTRACTION     early 90's    Patient Active Problem List   Diagnosis Date Noted   Depression with anxiety 10/15/2021   Nausea vomiting and diarrhea 08/13/2021   Type 2 diabetes mellitus without complication ([place]) 99991111   Hx of adenomatous colonic polyps 04/28/2021   Long term current use of systemic steroids 09/21/2020   Primary hypertension 08/30/2020   Morbid obesity ([place]) 01/01/2019   Polymyalgia rheumatica ([place]) 08/27/2017   Primary osteoarthritis of left knee 06/30/2016   Primary osteoarthritis of right knee 06/30/2016   Anxiety 01/17/2016   Impaired  glucose tolerance 01/17/2016   Mixed hyperlipidemia 01/17/2016   OSA on CPAP 01/17/2016    PCP: Elenor Quinones   REFERRING PROVIDER: Jaynee Eagles MD   REFERRING DIAG: right TKA  THERAPY DIAG:  No diagnosis found.  Rationale for Evaluation and Treatment: Rehabilitation  ONSET DATE: 01/08/2023  SUBJECTIVE:   SUBJECTIVE STATEMENT: Patient is feeling good today, is not experiencing any pain. Patient states that "walking is fine". Patient reports the scar is still a little sensitive. Patient is still scare to go down the stairs without assistance, she is currently using her cane and post to go down the stairs. Patient is less scare going up the the stairs without assistance, will give it a try today when she gets back home.  PERTINENT HISTORY: Right achilles repair, polymyalgia rheumatica; OA of both knees, sleep apneas. Depression, anxiety PAIN:  Are you having pain? Yes: NPRS scale: 8/10 sore  Pain location: right great toe  Pain description: aching  Aggravating factors: standing and walking  Relieving factors: rest   PRECAUTIONS: Knee  WEIGHT BEARING RESTRICTIONS: Yes WBAT   FALLS:  Has patient fallen in last 6 months? 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   Hobbies:  Play with her dog; walk her dog  PLOF: Independent  PATIENT GOALS:  To be able to do normal activity without increased pain   NEXT MD VISIT:  March 1st    OBJECTIVE:   DIAGNOSTIC FINDINGS: nothing post op  PATIENT SURVEYS:  FOTO    COGNITION: Overall cognitive status: Within functional limits for tasks assessed     SENSATION: Denies paresthesias   EDEMA:  Nothing significant  MUSCLE LENGTH:  POSTURE: No Significant postural limitations  PALPATION: No unexpected TTP   LOWER EXTREMITY ROM:  Passive ROM Right eval Left eval Right  3/8  Hip flexion     Hip extension     Hip abduction     Hip adduction     Hip internal  rotation     Hip external rotation     Knee flexion  85 degrees 100  Knee extension  -10 -4  Ankle dorsiflexion     Ankle plantarflexion     Ankle inversion     Ankle eversion      (Blank rows = not tested)  LOWER EXTREMITY MMT:  MMT Right eval Left eval  Hip flexion    Hip extension    Hip abduction    Hip adduction    Hip internal rotation    Hip external rotation    Knee flexion    Knee extension    Ankle dorsiflexion    Ankle plantarflexion    Ankle inversion    Ankle eversion     (Blank rows = not tested) not tested secondary recent surgery   FUNCTIONAL TESTS:  Bed mobility: Uses arms to lift right leg up onto the bed. Sit to stand transfer: Uses arms to push up. GAIT: Using a rolling walker. Decreased weight bearing on the left. Using UE to take weight off.   TODAY'S TREATMENT:                                                                                                                              DATE:  3/28 R knee PROM Grade II and III PA and AP mobilization for flexion and extension SLR 3x10  SAQ 3x10  Lateral/forward step 4 inch Step down 2 inch 2x10  Standing heel raises x20   3/19 R knee PROM Grade II and III PA and AP mobilization for flexion and extension SLR 3x10  SAQ 3x10  LAQ 3x15   3/15 R knee PROM Grade II and III PA and AP mobilization for flexion and extension SLR 3x10 1.5 lbs  LAQ 3x15  Step up 2 inch x20  Lateral step up 2 inch x20   Standing slow march x20   Standing heel/toe x15 but felt some pain   3/13 R knee PROM Grade II and III PA and AP mobilization for flexion and extension SLR 2x10  LAQ 2x15  Standing heel/toe x20  Gait: reviewed ambulation with a cane She walks with a stiff leg. She was advised this is normal. As she becomes more confident in the leg she  will naturally start to bend it more. We worked on some unlocked weight shifting at the counter.    3/8 R knee PROM Grade II and III PA and AP  mobilization for flexion and extension SLR 2x10  LAQ 2x15  Standing heel/toe x20   Step up 4" 2x10  Lateral step 2x10 4"   Gait training: ambulation with single point cane. Minimal cuing required. Patient appeared to be very safe.     3/5 R knee PROM Grade II and III PA and AP mobilization for flexion and extension  Quad set-5" 2x10 SAQ-5" 2x10 Heel slides 5" 5x   LAQ 2x15  Self knee flexion stretch 5x 10 sec hold   Step ups: 2 inch 2x10  Side steps 2x10 2 inch     3/1  R knee PROM Quad set-5" 2x10 SAQ-5" 2x10 Bridge 5" 2x10 Heel slides 5" 5x   Seated knee extension stretch 5x10 sec hold   Gait with FWW-1/2hall and back Gait with SPC (trial with SBA) 1/2 hall and back decreased cuing required. Good reciprocal gait noted   Heel raises 2x10 Standing left legf half march for weight bearing on the rigght x20     PATIENT EDUCATION:  Education details: HEP, symptom management;  Person educated: Patient Education method: Explanation, Demonstration, Tactile cues, Verbal cues, and Handouts Education comprehension: verbalized understanding, returned demonstration, verbal cues required, tactile cues required, and needs further education  HOME EXERCISE PROGRAM: Access Code: [facility] URL: [email or web address] Date: 01/11/2023 Prepared by: Carolyne Littles   ASSESSMENT:  CLINICAL IMPRESSION:  The pain in the patients big toe has resolved. She was able to progress back into stair training today. We added step down to her program. She continues to make progress with her ROM. We will progress stair training as tolerated.d  OBJECTIVE IMPAIRMENTS: Abnormal gait, decreased activity tolerance, decreased knowledge of use of DME, decreased mobility, difficulty walking, decreased ROM, decreased strength, and pain.   ACTIVITY LIMITATIONS: carrying, lifting, bending, sitting, standing, squatting, sleeping, stairs, transfers, bed mobility, bathing, dressing, and  locomotion level  PARTICIPATION LIMITATIONS: meal prep, cleaning, laundry, driving, shopping, community activity, occupation, and yard work  PERSONAL FACTORS: 1-2 comorbidities: polymyalgia rheumatica, anxiety, and depression   are also affecting patient's functional outcome.   REHAB POTENTIAL: Good  CLINICAL DECISION MAKING: Stable/uncomplicated  EVALUATION COMPLEXITY: Low   GOALS: Goals reviewed with patient? Yes  SHORT TERM GOALS: Target date: 02/08/2023   Patient will increase right knee flexion to 110 degrees Baseline: Goal status: IN PROGRESS 01/15/23  2.  Patient will demonstrate full right knee extension Baseline:  Goal status: INITIAL  3.  Patient will ambulate 300 feet with single-point cane showing good safety and weightbearing on the right side Baseline:  Goal status: INITIAL  4.  Patient will be independent with initial exercise program Baseline:  Goal status: IN PROGRESS 01/15/23  LONG TERM GOALS: Target date: 03/08/2023    Patient will go up and down 8 steps with reciprocal gait in order to ambulate in the community Baseline:  Goal status: INITIAL  2.  Patient will ambulate 3000 feet with least restrictive assistive device in order to go shopping and walk her dog Baseline:  Goal status: INITIAL  3.  Patient will be independent with complete exercise program Baseline:  Goal status: INITIAL  4.  Patient will stand for greater than 1 hour without increased right knee pain in order to perform ADLs and IADLs Baseline:  Goal status: INITIAL   PLAN:  PT FREQUENCY: 2x/week  PT DURATION: 8 weeks  PLANNED INTERVENTIONS: Therapeutic exercises, Therapeutic activity, Neuromuscular re-education, Balance training, Gait training, Patient/Family education, Self Care, Joint mobilization, Stair training, Aquatic Therapy, Dry Needling, Cryotherapy, Moist heat, Ultrasound, and Manual therapy  PLAN FOR NEXT SESSION: begin PROM into flexion and extension. Progress  to SLR as tolerated and SAQ; progress standing exercises as tolerated. Start nu-step.    Carolyne Littles PT DPT  02/14/2023, 11:09 AM

## 2023-02-15 ENCOUNTER — Other Ambulatory Visit: Payer: Self-pay | Admitting: Internal Medicine

## 2023-02-19 ENCOUNTER — Ambulatory Visit (HOSPITAL_BASED_OUTPATIENT_CLINIC_OR_DEPARTMENT_OTHER): Payer: BC Managed Care – PPO | Attending: Sports Medicine

## 2023-02-19 ENCOUNTER — Encounter (HOSPITAL_BASED_OUTPATIENT_CLINIC_OR_DEPARTMENT_OTHER): Payer: Self-pay

## 2023-02-19 DIAGNOSIS — R600 Localized edema: Secondary | ICD-10-CM | POA: Diagnosis present

## 2023-02-19 DIAGNOSIS — R2689 Other abnormalities of gait and mobility: Secondary | ICD-10-CM

## 2023-02-19 DIAGNOSIS — M25661 Stiffness of right knee, not elsewhere classified: Secondary | ICD-10-CM | POA: Diagnosis present

## 2023-02-19 DIAGNOSIS — M25561 Pain in right knee: Secondary | ICD-10-CM

## 2023-02-19 DIAGNOSIS — M6281 Muscle weakness (generalized): Secondary | ICD-10-CM | POA: Insufficient documentation

## 2023-02-19 NOTE — Therapy (Signed)
OUTPATIENT PHYSICAL THERAPY LOWER EXTREMITY Treatment   Patient Name: Diana Herman MRN: [id number] DOB:05/21/1966, 57 y.o., female Today's Date: 01/29/2023  END OF SESSION:  PT End of Session - 02/19/23 1458     Visit Number 10    Number of Visits 16    Date for PT Re-Evaluation 03/08/23    Authorization Type BCBS    PT Start Time 1435    PT Stop Time 1515    PT Time Calculation (min) 40 min    Activity Tolerance Patient tolerated treatment well    Behavior During Therapy [facility] for tasks assessed/performed                Past Medical History:  Diagnosis Date   Achilles rupture, right 08/18/2018   Formatting of this note might be different from the original. 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 09/2021   Severe sepsis with acute organ dysfunction 10/15/2021   Sleep apnea    wears cpap    Past Surgical History:  Procedure Laterality Date   ACHILLES TENDON SURGERY Right    COLONOSCOPY  04/14/2021   [facility]   COLONOSCOPY WITH ESOPHAGOGASTRODUODENOSCOPY (EGD)  01/2022   HYSTERECTOMY     hysteroscopy and polypectomy     POLYPECTOMY     TONSILLECTOMY     WISDOM TOOTH EXTRACTION     early 90's    Patient Active Problem List   Diagnosis Date Noted   Depression with anxiety 10/15/2021   Nausea vomiting and diarrhea 08/13/2021   Type 2 diabetes mellitus without complication 99991111   Hx of adenomatous colonic  polyps 04/28/2021   Long term current use of systemic steroids 09/21/2020   Primary hypertension 08/30/2020   Morbid obesity 01/01/2019   Polymyalgia rheumatica 08/27/2017   Primary osteoarthritis of left knee 06/30/2016   Primary osteoarthritis of right knee 06/30/2016   Anxiety 01/17/2016   Impaired glucose tolerance 01/17/2016   Mixed hyperlipidemia 01/17/2016   OSA on CPAP 01/17/2016    PCP: Elenor Quinones   REFERRING PROVIDER: Jaynee Eagles MD   REFERRING DIAG: right TKA  THERAPY DIAG:  Muscle weakness (generalized)  Other abnormalities of gait and mobility  Acute pain of right knee  Stiffness of right knee, not elsewhere classified  Localized edema  Rationale for Evaluation and Treatment: Rehabilitation  ONSET DATE: 01/08/2023  SUBJECTIVE:   SUBJECTIVE STATEMENT: Pt reports her R knee is doing better. "I'm going to schedule the L surgery for June I think."  PERTINENT HISTORY: Right achilles repair, polymyalgia rheumatica; OA of both knees, sleep apneas. Depression, anxiety PAIN:  Are you having pain? Yes: NPRS scale: 8/10 sore  Pain location: right great toe  Pain description: aching  Aggravating factors: standing and walking  Relieving factors: rest   PRECAUTIONS: Knee  WEIGHT  BEARING RESTRICTIONS: Yes WBAT   FALLS:  Has patient fallen in last 6 months? 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   Hobbies:  Play with her dog; walk her dog  PLOF: Independent  PATIENT GOALS:   To be able to do normal activity without increased pain   NEXT MD VISIT:  March 1st    OBJECTIVE:   DIAGNOSTIC FINDINGS: nothing post op  PATIENT SURVEYS:  FOTO    COGNITION: Overall cognitive status: Within functional limits for tasks assessed     SENSATION: Denies paresthesias   EDEMA:  Nothing significant  MUSCLE LENGTH:  POSTURE: No Significant postural limitations  PALPATION: No unexpected TTP    LOWER EXTREMITY ROM:  Passive ROM Right eval Left eval Right  3/8 Right 4/2  Hip flexion      Hip extension      Hip abduction      Hip adduction      Hip internal rotation      Hip external rotation      Knee flexion  85 degrees 100 110  Knee extension  -10 -4 -2  Ankle dorsiflexion      Ankle plantarflexion      Ankle inversion      Ankle eversion       (Blank rows = not tested)  LOWER EXTREMITY MMT:  MMT Right eval Left eval  Hip flexion    Hip extension    Hip abduction    Hip adduction    Hip internal rotation    Hip external rotation    Knee flexion    Knee extension    Ankle dorsiflexion    Ankle plantarflexion    Ankle inversion    Ankle eversion     (Blank rows = not tested) not tested secondary recent surgery   FUNCTIONAL TESTS:  Bed mobility: Uses arms to lift right leg up onto the bed. Sit to stand transfer: Uses arms to push up. GAIT: Using a rolling walker. Decreased weight bearing on the left. Using UE to take weight off.   TODAY'S TREATMENT:                                                                                                                              DATE:   4/2  Bike 83min  Gait training-hallx2  Updated ROM R knee PROM flex/ext with OP at end ranges SLR 3x10  LAQ 5# 2x10- 5sec hold forward step 6in 2x10 Step down 2 inch 2x10  Standing heel raises 2x15  3/28 R knee PROM Grade II and III PA and AP mobilization for flexion and extension SLR 3x10  SAQ 3x10  Lateral/forward step 4 inch Step down 2 inch 2x10  Standing heel raises x20   3/19 R knee PROM Grade II and III PA and AP mobilization for flexion and extension SLR 3x10  SAQ 3x10  LAQ  3x15   3/15 R knee PROM Grade II and III PA and AP mobilization for flexion and extension SLR 3x10 1.5 lbs  LAQ 3x15  Step up 2 inch x20  Lateral step up 2 inch x20   Standing slow march x20   Standing heel/toe x15 but felt some pain   PATIENT EDUCATION:   Education details: HEP, symptom management;  Person educated: Patient Education method: Explanation, Demonstration, Tactile cues, Verbal cues, and Handouts Education comprehension: verbalized understanding, returned demonstration, verbal cues required, tactile cues required, and needs further education  HOME EXERCISE PROGRAM: Access Code: [facility] URL: [email or web address] Date: 01/11/2023 Prepared by: Carolyne Littles   ASSESSMENT:  CLINICAL IMPRESSION:  Pt demonstrates excellent gait pattern with very minimal deviations. She was able to complete full revolutions on RCB today in clinic, though she did report some stiffness with this. Updated measurements which show increases from last update. She lacks ROM at end ranges in both flexion and extension still and was educated on mehtods to stretch each of these at home. Continued to work on improving ROM to WNL. Will try going to the grocery store and walking her dog soon.    OBJECTIVE IMPAIRMENTS: Abnormal gait, decreased activity tolerance, decreased knowledge of use of DME, decreased mobility, difficulty walking, decreased ROM, decreased strength, and pain.   ACTIVITY LIMITATIONS: carrying, lifting, bending, sitting, standing, squatting, sleeping, stairs, transfers, bed mobility, bathing, dressing, and locomotion level  PARTICIPATION LIMITATIONS: meal prep, cleaning, laundry, driving, shopping, community activity, occupation, and yard work  PERSONAL FACTORS: 1-2 comorbidities: polymyalgia rheumatica, anxiety, and depression   are also affecting patient's functional outcome.   REHAB POTENTIAL: Good  CLINICAL DECISION MAKING: Stable/uncomplicated  EVALUATION COMPLEXITY: Low   GOALS: Goals reviewed with patient? Yes  SHORT TERM GOALS: Target date: 02/08/2023   Patient will increase right knee flexion to 110 degrees Baseline: Goal status: IN PROGRESS 01/15/23  2.  Patient will demonstrate full right knee  extension Baseline:  Goal status: INITIAL  3.  Patient will ambulate 300 feet with single-point cane showing good safety and weightbearing on the right side Baseline:  Goal status: MET 4/2  4.  Patient will be independent with initial exercise program Baseline:  Goal status: MET 4/2  LONG TERM GOALS: Target date: 03/08/2023    Patient will go up and down 8 steps with reciprocal gait in order to ambulate in the community Baseline:  Goal status: IN PROGRESS Has difficulty with descent due to L knee which will be replaced soon as well.  2.  Patient will ambulate 3000 feet with least restrictive assistive device in order to go shopping and walk her dog Baseline:  Goal status: IN PROGRESS   3.  Patient will be independent with complete exercise program Baseline:  Goal status: INITIAL  4.  Patient will stand for greater than 1 hour without increased right knee pain in order to perform ADLs and IADLs Baseline:  Goal status: NOT MET (limited by back pain. Can stand for a minute or 2) 4/2   PLAN:  PT FREQUENCY: 2x/week  PT DURATION: 8 weeks  PLANNED INTERVENTIONS: Therapeutic exercises, Therapeutic activity, Neuromuscular re-education, Balance training, Gait training, Patient/Family education, Self Care, Joint mobilization, Stair training, Aquatic Therapy, Dry Needling, Cryotherapy, Moist heat, Ultrasound, and Manual therapy  PLAN FOR NEXT SESSION: begin PROM into flexion and extension. Progress to SLR as tolerated and SAQ; progress standing exercises as tolerated. Start nu-step.    Sherlynn Carbon, PTA  02/19/2023, 4:56 PM

## 2023-02-22 ENCOUNTER — Ambulatory Visit (HOSPITAL_BASED_OUTPATIENT_CLINIC_OR_DEPARTMENT_OTHER): Payer: BC Managed Care – PPO

## 2023-02-22 ENCOUNTER — Encounter (HOSPITAL_BASED_OUTPATIENT_CLINIC_OR_DEPARTMENT_OTHER): Payer: Self-pay

## 2023-02-22 DIAGNOSIS — R2689 Other abnormalities of gait and mobility: Secondary | ICD-10-CM

## 2023-02-22 DIAGNOSIS — M6281 Muscle weakness (generalized): Secondary | ICD-10-CM

## 2023-02-22 DIAGNOSIS — M25661 Stiffness of right knee, not elsewhere classified: Secondary | ICD-10-CM

## 2023-02-22 DIAGNOSIS — M25561 Pain in right knee: Secondary | ICD-10-CM

## 2023-02-22 DIAGNOSIS — R600 Localized edema: Secondary | ICD-10-CM

## 2023-02-22 NOTE — Therapy (Signed)
OUTPATIENT PHYSICAL THERAPY LOWER EXTREMITY Treatment   Patient Name: Diana Herman MRN: [id number] DOB:February 18, 1966, 57 y.o., female Today's Date: 01/29/2023  END OF SESSION:  PT End of Session - 02/22/23 1436     Visit Number 11    Number of Visits 16    Date for PT Re-Evaluation 03/08/23    Authorization Type BCBS    PT Start Time 1432    PT Stop Time 1507    PT Time Calculation (min) 35 min    Activity Tolerance Patient tolerated treatment well    Behavior During Therapy [facility] for tasks assessed/performed                 Past Medical History:  Diagnosis Date   Achilles rupture, right 08/18/2018   Formatting of this note might be different from the original. 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 09/2021   Severe sepsis with acute organ dysfunction 10/15/2021   Sleep apnea    wears cpap    Past Surgical History:  Procedure Laterality Date   ACHILLES TENDON SURGERY Right    COLONOSCOPY  04/14/2021   [facility]   COLONOSCOPY WITH ESOPHAGOGASTRODUODENOSCOPY (EGD)  01/2022   HYSTERECTOMY     hysteroscopy and polypectomy     POLYPECTOMY     TONSILLECTOMY     WISDOM TOOTH EXTRACTION     early 90's    Patient Active Problem List   Diagnosis Date Noted   Depression with anxiety 10/15/2021   Nausea vomiting and diarrhea 08/13/2021   Type 2 diabetes mellitus without complication 08/13/2021   Hx of adenomatous colonic  polyps 04/28/2021   Long term current use of systemic steroids 09/21/2020   Primary hypertension 08/30/2020   Morbid obesity 01/01/2019   Polymyalgia rheumatica 08/27/2017   Primary osteoarthritis of left knee 06/30/2016   Primary osteoarthritis of right knee 06/30/2016   Anxiety 01/17/2016   Impaired glucose tolerance 01/17/2016   Mixed hyperlipidemia 01/17/2016   OSA on CPAP 01/17/2016    PCP: Pricilla Holm   REFERRING PROVIDER: Darnell Level MD   REFERRING DIAG: right TKA  THERAPY DIAG:  Muscle weakness (generalized)  Other abnormalities of gait and mobility  Acute pain of right knee  Stiffness of right knee, not elsewhere classified  Localized edema  Rationale for Evaluation and Treatment: Rehabilitation  ONSET DATE: 01/08/2023  SUBJECTIVE:   SUBJECTIVE STATEMENT: Pt reports her R  and L knees are sore today. She was able to walk around a store without R knee pain afterwards.  PERTINENT HISTORY: Right achilles repair, polymyalgia rheumatica; OA of both knees, sleep apneas. Depression, anxiety PAIN:  Are you having pain? Yes: NPRS scale: 8/10 sore  Pain location: right great toe  Pain description: aching  Aggravating factors: standing and walking  Relieving factors: rest  PRECAUTIONS: Knee  WEIGHT BEARING RESTRICTIONS: Yes WBAT   FALLS:  Has patient fallen in last 6 months? 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   Hobbies:  Play with her dog; walk her dog  PLOF: Independent  PATIENT GOALS:   To be able to do normal activity without increased pain   NEXT MD VISIT:  March 1st    OBJECTIVE:   DIAGNOSTIC FINDINGS: nothing post op  PATIENT SURVEYS:  FOTO    COGNITION: Overall cognitive status: Within functional limits for tasks assessed     SENSATION: Denies paresthesias   EDEMA:  Nothing significant  MUSCLE LENGTH:  POSTURE: No Significant postural  limitations  PALPATION: No unexpected TTP   LOWER EXTREMITY ROM:  Passive ROM Right eval Left eval Right  3/8 Right 4/2  Hip flexion      Hip extension      Hip abduction      Hip adduction      Hip internal rotation      Hip external rotation      Knee flexion  85 degrees 100 110  Knee extension  -10 -4 -2  Ankle dorsiflexion      Ankle plantarflexion      Ankle inversion      Ankle eversion       (Blank rows = not tested)  LOWER EXTREMITY MMT:  MMT Right eval Left eval  Hip flexion    Hip extension    Hip abduction    Hip adduction    Hip internal rotation    Hip external rotation    Knee flexion    Knee extension    Ankle dorsiflexion    Ankle plantarflexion    Ankle inversion    Ankle eversion     (Blank rows = not tested) not tested secondary recent surgery   FUNCTIONAL TESTS:  Bed mobility: Uses arms to lift right leg up onto the bed. Sit to stand transfer: Uses arms to push up. GAIT: Using a rolling walker. Decreased weight bearing on the left. Using UE to take weight off.   TODAY'S TREATMENT:                                                                                                                              DATE:   4/5  Bike 4min   R knee PROM flex/ext with OP at end ranges SLR 3x10  LAQ 5# 3x10- 5sec hold Seated HSC GTB 2x10 forward step 6in 2x10 Step down 4 inch 2x10 bilateral UE support Standing heel raises 2x15  4/2  Bike 4min  Gait training-hallx2  Updated ROM R knee PROM flex/ext with OP at end ranges SLR 3x10  LAQ 5# 2x10- 5sec hold forward step 6in 2x10 Step down 2 inch 2x10  Standing heel raises 2x15  3/28 R knee PROM Grade II and III PA and AP mobilization for  flexion and extension SLR 3x10  SAQ 3x10  Lateral/forward step 4 inch Step down 2 inch 2x10  Standing heel raises x20   3/19 R knee PROM Grade II and III PA and AP mobilization for flexion and extension SLR 3x10  SAQ 3x10  LAQ 3x15   3/15 R  knee PROM Grade II and III PA and AP mobilization for flexion and extension SLR 3x10 1.5 lbs  LAQ 3x15  Step up 2 inch x20  Lateral step up 2 inch x20   Standing slow march x20   Standing heel/toe x15 but felt some pain   PATIENT EDUCATION:  Education details: HEP, symptom management;  Person educated: Patient Education method: Explanation, Demonstration, Tactile cues, Verbal cues, and Handouts Education comprehension: verbalized understanding, returned demonstration, verbal cues required, tactile cues required, and needs further education  HOME EXERCISE PROGRAM: Access Code: [email or web address] URL: [email or web address] Date: 01/11/2023 Prepared by: Lorayne Benderavid Carroll   ASSESSMENT:  CLINICAL IMPRESSION:  PT with increased stiffness initially during RCB warm up. She did well with PROM and end range stretching. Able to progress step height with eccentric step down, but was challenged by this. She tends to compensate using R hip and did better once provided with bilateral UE support. Will continue to work on ROM and functional strength within her pain limitations. She is hoping to schedule L TKA for June.   OBJECTIVE IMPAIRMENTS: Abnormal gait, decreased activity tolerance, decreased knowledge of use of DME, decreased mobility, difficulty walking, decreased ROM, decreased strength, and pain.   ACTIVITY LIMITATIONS: carrying, lifting, bending, sitting, standing, squatting, sleeping, stairs, transfers, bed mobility, bathing, dressing, and locomotion level  PARTICIPATION LIMITATIONS: meal prep, cleaning, laundry, driving, shopping, community activity, occupation, and yard work  PERSONAL FACTORS: 1-2 comorbidities: polymyalgia rheumatica, anxiety, and depression   are also affecting patient's functional outcome.   REHAB POTENTIAL: Good  CLINICAL DECISION MAKING: Stable/uncomplicated  EVALUATION COMPLEXITY: Low   GOALS: Goals reviewed with patient? Yes  SHORT TERM GOALS:  Target date: 02/08/2023   Patient will increase right knee flexion to 110 degrees Baseline: Goal status: IN PROGRESS 01/15/23  2.  Patient will demonstrate full right knee extension Baseline:  Goal status: INITIAL  3.  Patient will ambulate 300 feet with single-point cane showing good safety and weightbearing on the right side Baseline:  Goal status: MET 4/2  4.  Patient will be independent with initial exercise program Baseline:  Goal status: MET 4/2  LONG TERM GOALS: Target date: 03/08/2023    Patient will go up and down 8 steps with reciprocal gait in order to ambulate in the community Baseline:  Goal status: IN PROGRESS Has difficulty with descent due to L knee which will be replaced soon as well.  2.  Patient will ambulate 3000 feet with least restrictive assistive device in order to go shopping and walk her dog Baseline:  Goal status: IN PROGRESS   3.  Patient will be independent with complete exercise program Baseline:  Goal status: INITIAL  4.  Patient will stand for greater than 1 hour without increased right knee pain in order to perform ADLs and IADLs Baseline:  Goal status: NOT MET (limited by back pain. Can stand for a minute or 2) 4/2   PLAN:  PT FREQUENCY: 2x/week  PT DURATION: 8 weeks  PLANNED INTERVENTIONS: Therapeutic exercises, Therapeutic activity, Neuromuscular re-education, Balance training, Gait training, Patient/Family education, Self Care, Joint mobilization, Stair training, Aquatic Therapy, Dry Needling, Cryotherapy, Moist heat, Ultrasound, and Manual therapy  PLAN FOR  NEXT SESSION: begin PROM into flexion and extension. Progress to SLR as tolerated and SAQ; progress standing exercises as tolerated. Start nu-step.    Riki Altes, PTA  02/22/2023, 3:18 PM

## 2023-02-26 ENCOUNTER — Encounter (HOSPITAL_BASED_OUTPATIENT_CLINIC_OR_DEPARTMENT_OTHER): Payer: Self-pay

## 2023-02-26 ENCOUNTER — Encounter (HOSPITAL_BASED_OUTPATIENT_CLINIC_OR_DEPARTMENT_OTHER): Payer: BC Managed Care – PPO | Admitting: Physical Therapy

## 2023-03-01 ENCOUNTER — Ambulatory Visit (HOSPITAL_BASED_OUTPATIENT_CLINIC_OR_DEPARTMENT_OTHER): Payer: BC Managed Care – PPO

## 2023-03-14 ENCOUNTER — Encounter (HOSPITAL_BASED_OUTPATIENT_CLINIC_OR_DEPARTMENT_OTHER): Payer: Self-pay

## 2023-03-14 ENCOUNTER — Encounter (HOSPITAL_BASED_OUTPATIENT_CLINIC_OR_DEPARTMENT_OTHER): Payer: BC Managed Care – PPO | Admitting: Physical Therapy

## 2023-04-02 IMAGING — CT CT ABD-PELV W/ CM
2 of 6 series · 16 of 46 positions shown, 18 images · IV contrast (APPLIED)
Comparison: None.

CLINICAL DATA: Abdominal pain, fever suspected C diff colitis
evaluate for perforation

EXAM:
CT ABDOMEN AND PELVIS WITH CONTRAST
TECHNIQUE: Multidetector CT imaging of the abdomen and pelvis was performed
using the standard protocol following bolus administration of
intravenous contrast.
CONTRAST:  100mL OMNIPAQUE IOHEXOL 300 MG/ML  SOLN

[Series 3: abd/ pelvis 5.0 i30f 2 · axial · 0.95mm/px · z∈[+854,+1309]mm · 13 of 105 slices shown, 15 images]
[im 7/105  soft-tissue]
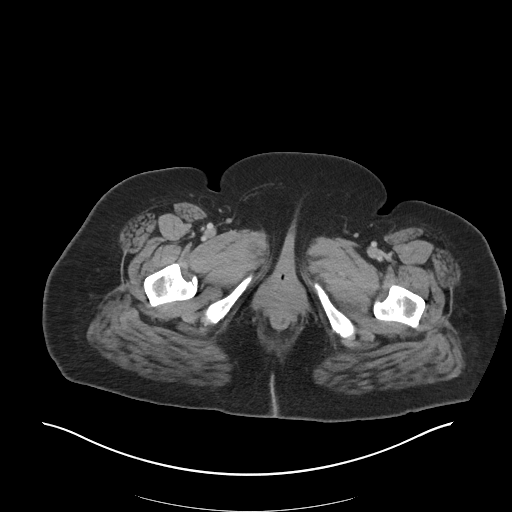
[im 7/105  bone]
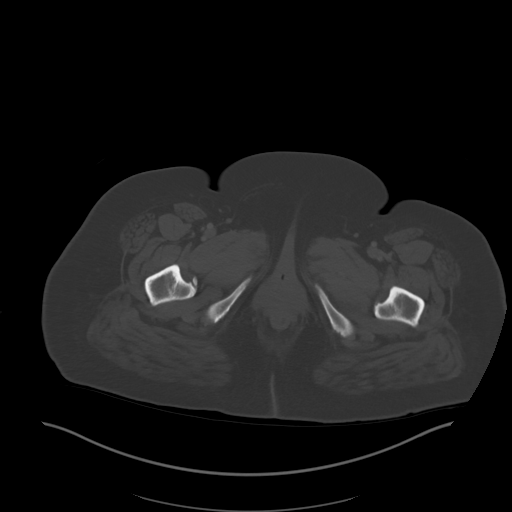
[im 14/105  soft-tissue]
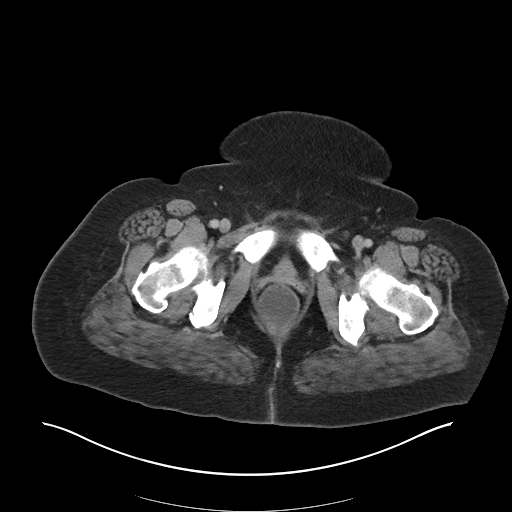
[im 21/105  soft-tissue]
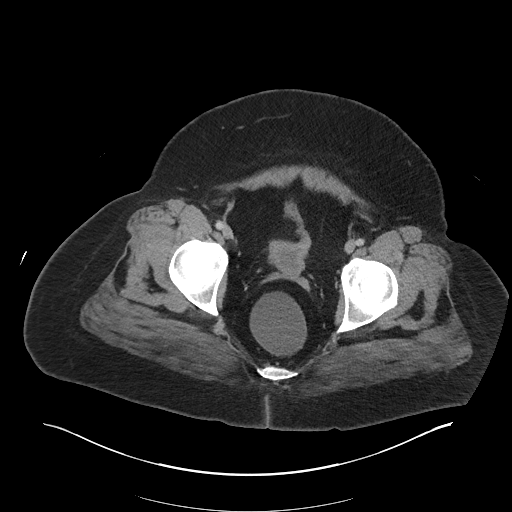
[im 28/105  soft-tissue]
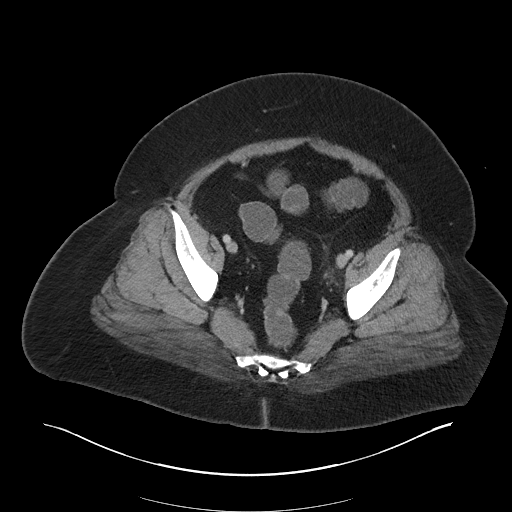
[im 35/105  soft-tissue]
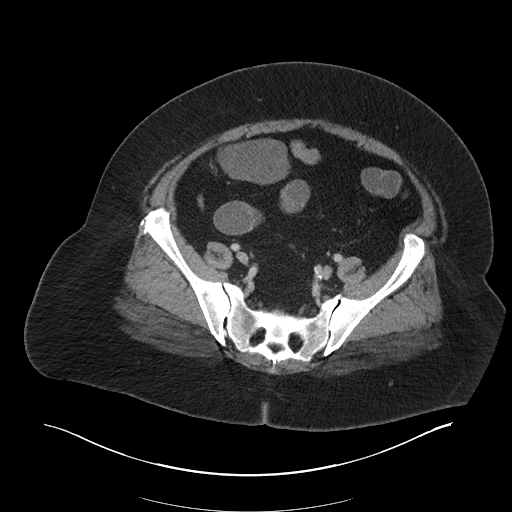
[im 42/105  soft-tissue]
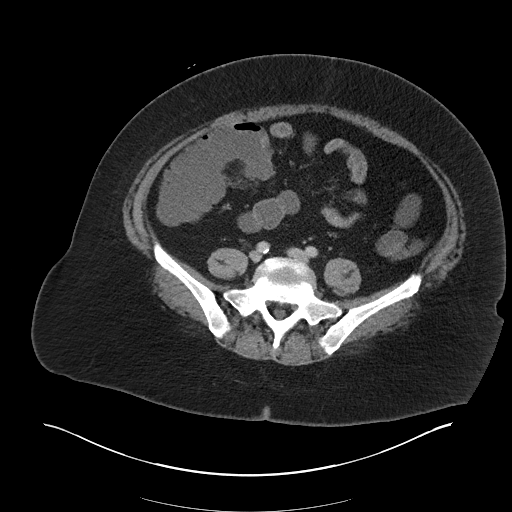
[im 56/105  soft-tissue]
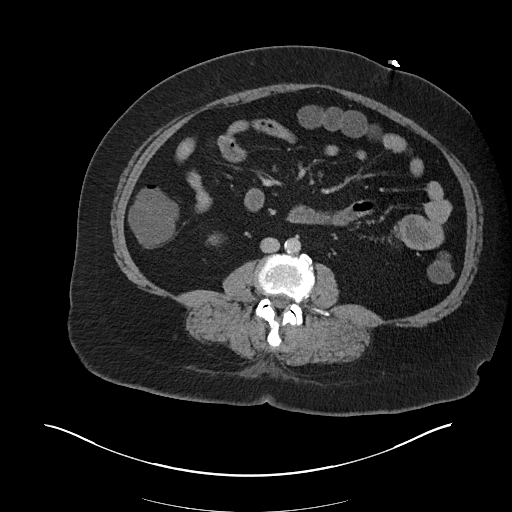
[im 63/105  soft-tissue]
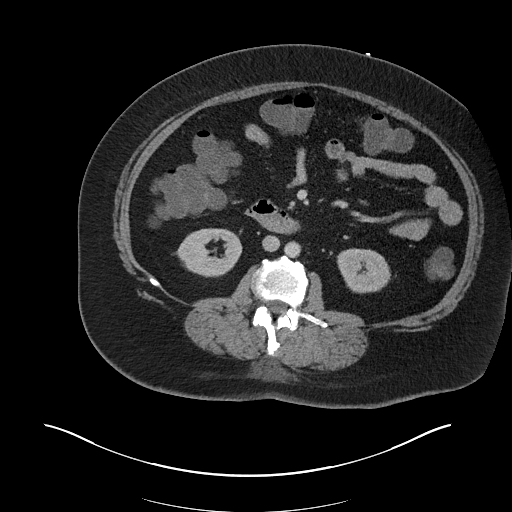
[im 70/105  soft-tissue]
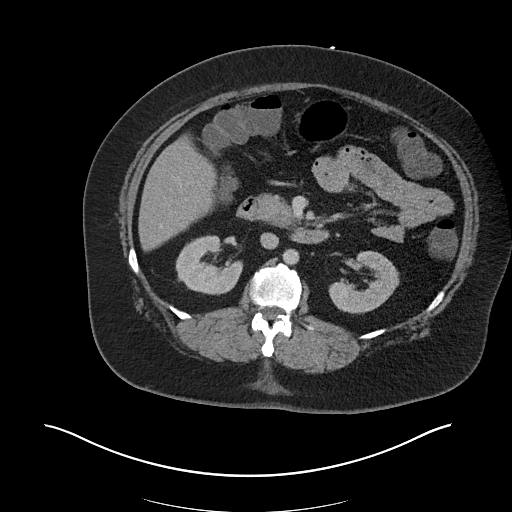
[im 70/105  bone]
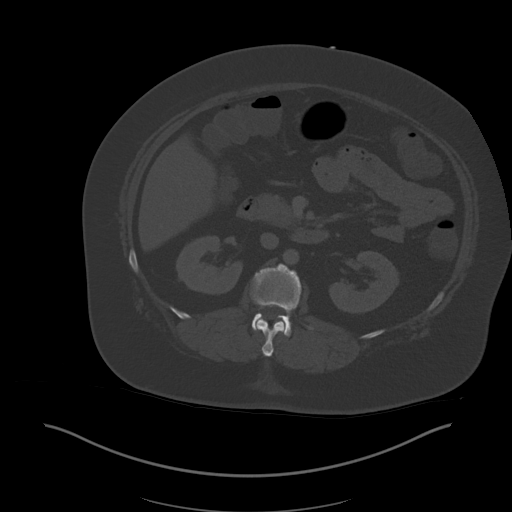
[im 77/105  soft-tissue]
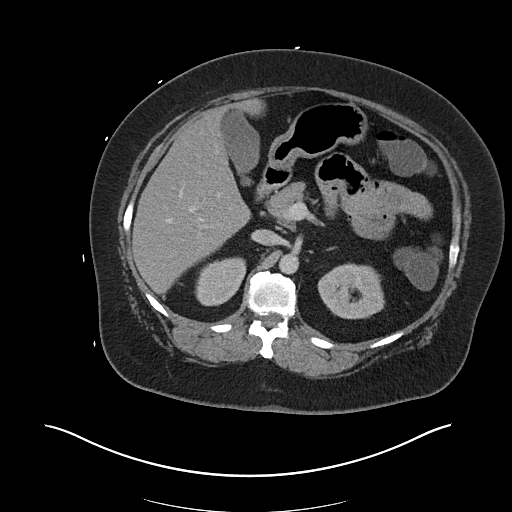
[im 84/105  soft-tissue]
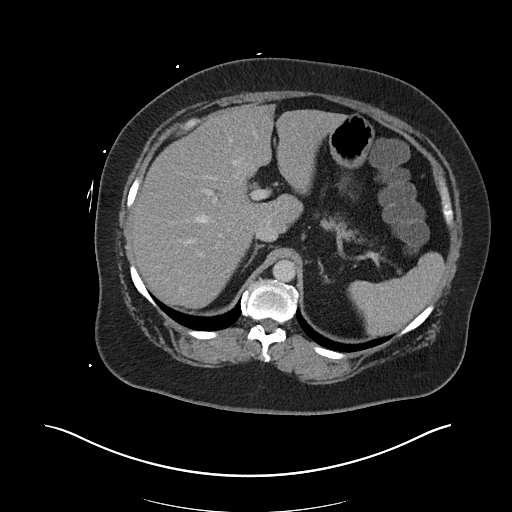
[im 91/105  soft-tissue]
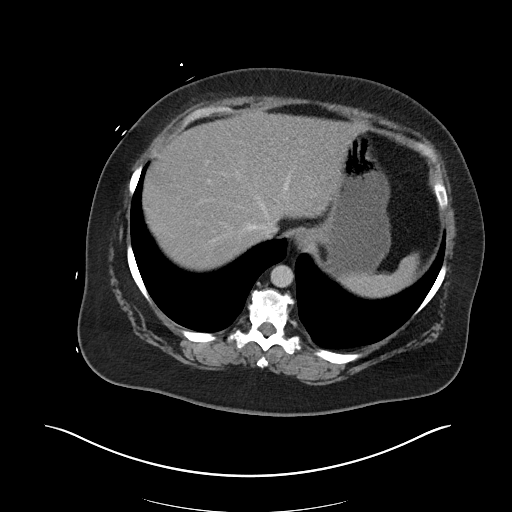
[im 98/105  soft-tissue]
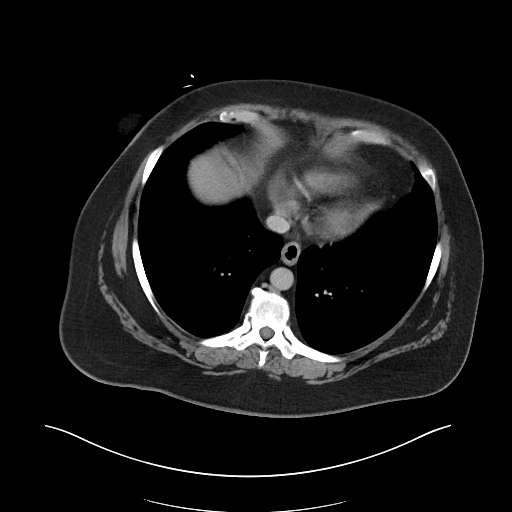

[Series 6: coronal soft tissue · coronal · 0.82mm/px · 3 of 119 slices shown]
[im 40/119  soft-tissue]
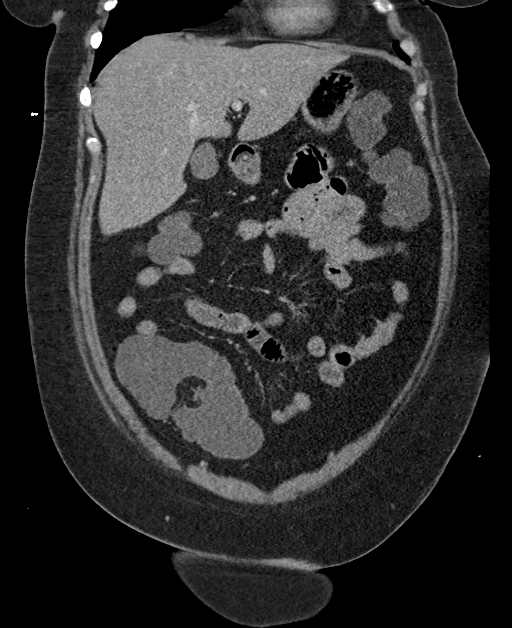
[im 53/119  soft-tissue]
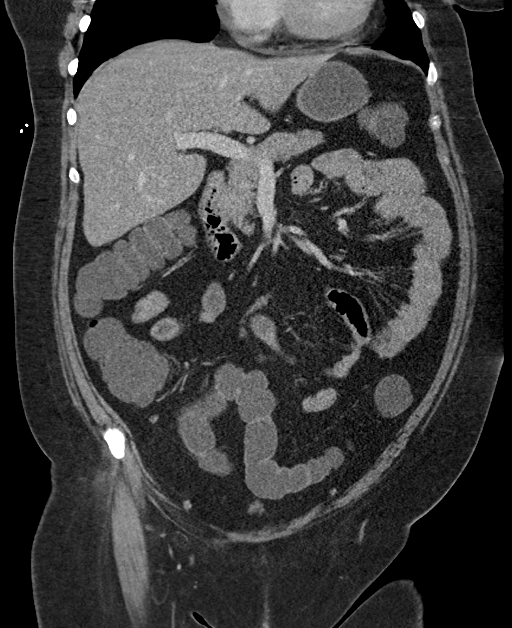
[im 66/119  soft-tissue]
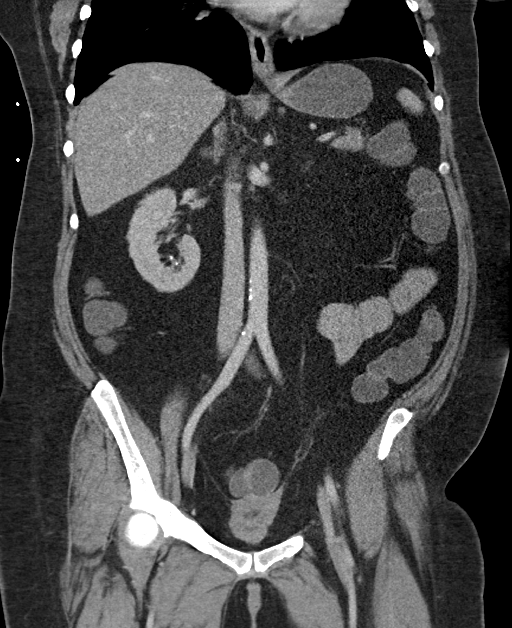

[16 of 46 positions shown; findings below may reference images not displayed]

FINDINGS: Lower chest: No acute abnormality.

Hepatobiliary: No focal liver abnormality. No gallstones,
gallbladder wall thickening, or pericholecystic fluid. No biliary
dilatation.

Pancreas: No focal lesion. Normal pancreatic contour. No surrounding
inflammatory changes. No main pancreatic ductal dilatation.

Spleen: Normal in size without focal abnormality.

Adrenals/Urinary Tract:

No adrenal nodule bilaterally.

Bilateral kidneys enhance symmetrically. Punctate calcifications
within bilateral kidneys.

No hydronephrosis. No hydroureter.

The urinary bladder is decompressed.

On delayed imaging, there is no urothelial wall thickening and there
are no filling defects in the opacified portions of the bilateral
collecting systems or ureters.

Stomach/Bowel: Stomach is within normal limits. No evidence of bowel
wall thickening or dilatation. Fluid throughout the lumen of the
colon. No associated bowel wall thickening or pericolonic fat
stranding. No pneumatosis. Appendix appears in caliber with no
inflammatory changing changes. Appendicoliths is noted within the
appendiceal lumen ([DATE]).

Vascular/Lymphatic: No abdominal aorta or iliac aneurysm. Mild
atherosclerotic plaque of the aorta and its branches. No abdominal,
pelvic, or inguinal lymphadenopathy.

Reproductive: Status post hysterectomy. No adnexal masses.

Other: No intraperitoneal free fluid. No intraperitoneal free gas.
No organized fluid collection.

Musculoskeletal:

Tiny fat containing umbilical hernia.

No suspicious lytic or blastic osseous lesions. No acute displaced
fracture. Multilevel degenerative changes of the spine.
IMPRESSION: 1. Fast transition state of the colon. No associated bowel
perforation or findings of inflammation.
2. Nonobstructive punctate bilateral nephrolithiasis.
3.  Aortic Atherosclerosis ([id number]).

## 2023-04-05 ENCOUNTER — Ambulatory Visit (HOSPITAL_BASED_OUTPATIENT_CLINIC_OR_DEPARTMENT_OTHER): Payer: BC Managed Care – PPO | Attending: Sports Medicine | Admitting: Physical Therapy

## 2023-04-05 ENCOUNTER — Encounter (HOSPITAL_BASED_OUTPATIENT_CLINIC_OR_DEPARTMENT_OTHER): Payer: Self-pay | Admitting: Physical Therapy

## 2023-04-05 DIAGNOSIS — M25512 Pain in left shoulder: Secondary | ICD-10-CM | POA: Diagnosis present

## 2023-04-05 DIAGNOSIS — M6281 Muscle weakness (generalized): Secondary | ICD-10-CM | POA: Diagnosis present

## 2023-04-05 NOTE — Therapy (Signed)
OUTPATIENT PHYSICAL THERAPY SHOULDER EVALUATION   Patient Name: KHUSHI FAUSTINO MRN: [id number] DOB:May 21, 1966, 57 y.o., female Today's Date: 04/05/2023  END OF SESSION:  PT End of Session - 04/05/23 0803     Visit Number 1    Number of Visits 13    Date for PT Re-Evaluation 05/17/23    Authorization Type BCBS    PT Start Time 0800    PT Stop Time 0843    PT Time Calculation (min) 43 min    Activity Tolerance Patient tolerated treatment well    Behavior During Therapy [facility] for tasks assessed/performed             Past Medical History:  Diagnosis Date   Achilles rupture, right 08/18/2018   Formatting of this note might be different from the original. 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   Severe sepsis with acute organ dysfunction (HCC) 10/15/2021   Sleep apnea    wears cpap    Past Surgical History:  Procedure Laterality Date   ACHILLES TENDON SURGERY Right    COLONOSCOPY  04/14/2021   [facility]   COLONOSCOPY WITH ESOPHAGOGASTRODUODENOSCOPY (EGD)  01/2022   HYSTERECTOMY     hysteroscopy and polypectomy     POLYPECTOMY     TONSILLECTOMY     WISDOM TOOTH EXTRACTION     early 90's    Patient Active Problem List   Diagnosis Date Noted   Depression with anxiety 10/15/2021   Nausea vomiting and diarrhea 08/13/2021   Type 2 diabetes mellitus without complication (HCC) 08/13/2021    Hx of adenomatous colonic polyps 04/28/2021   Long term current use of systemic steroids 09/21/2020   Primary hypertension 08/30/2020   Morbid obesity (HCC) 01/01/2019   Polymyalgia rheumatica (HCC) 08/27/2017   Primary osteoarthritis of left knee 06/30/2016   Primary osteoarthritis of right knee 06/30/2016   Anxiety 01/17/2016   Impaired glucose tolerance 01/17/2016   Mixed hyperlipidemia 01/17/2016   OSA on CPAP 01/17/2016    PCP: Pricilla Holm MD   REFERRING PROVIDER: Darnell Level MD   REFERRING DIAG:  Diagnosis  M25.512 (ICD-10-CM) - Pain in left shoulder    THERAPY DIAG:  No diagnosis found.  Rationale for Evaluation and Treatment: Rehabilitation  ONSET DATE: > 6 weeks prior SUBJECTIVE:  SUBJECTIVE STATEMENT: The patient presents with an acute onset of right shoulder pain. The onset was about 6 weeks prior. She has no prior incidence of pain. Sh ehas more pain when she sleeps and uses her shoulder. She will be having knee surgery in 2 weeks.    Hand dominance: Right  PERTINENT HISTORY:   PAIN:  Are you having pain? Yes: NPRS scale: 6/10 Pain location: right shoulder  Pain description: aching  Aggravating factors: use of the left shoulder, sleeping  Relieving factors: Rest  PRECAUTIONS: None  WEIGHT BEARING RESTRICTIONS: No  FALLS:  Has patient fallen in last 6 months? No  LIVING ENVIRONMENT:  OCCUPATION: Nutritionist   PLOF: Independent  PATIENT GOALS:  NEXT MD VISIT:   OBJECTIVE:   DIAGNOSTIC FINDINGS:  X-ray: (-)  PATIENT SURVEYS:  FOTO    COGNITION: Overall cognitive status: Within functional limits for tasks assessed     SENSATION: WFL  POSTURE: Good   UPPER EXTREMITY ROM:   Active ROM Right eval Left eval  Shoulder flexion Full  140   Shoulder extension    Shoulder abduction    Shoulder adduction    Shoulder internal rotation    Shoulder external rotation    Elbow flexion    Elbow extension    Wrist flexion    Wrist extension    Wrist ulnar deviation    Wrist radial deviation    Wrist pronation    Wrist supination    (Blank rows = not tested)   Active PROM Right eval Left eval  Shoulder flexion    Shoulder extension    Shoulder abduction    Shoulder adduction    Shoulder internal rotation    Shoulder external rotation    Elbow flexion    Elbow extension    Wrist flexion    Wrist extension    Wrist ulnar deviation    Wrist radial deviation    Wrist pronation    Wrist supination    (Blank rows = not tested)  UPPER EXTREMITY MMT:  MMT Right eval Left eval  Shoulder flexion    Shoulder extension    Shoulder abduction    Shoulder adduction    Shoulder internal rotation    Shoulder external rotation    Middle trapezius    Lower trapezius    Elbow flexion    Elbow extension    Wrist flexion    Wrist extension    Wrist ulnar deviation    Wrist radial deviation    Wrist pronation    Wrist supination    Grip strength (lbs)    (Blank rows = not tested)  SHOULDER SPECIAL TESTS:    JOINT MOBILITY TESTING:    PALPATION:     TODAY'S TREATMENT:  DATE:  Access Code: [email or web address] URL: [email or web address] Date: 04/05/2023 Prepared by: Lorayne Bender  Exercises - Supine Shoulder Press AAROM in Abduction with Dowel  - 1 x daily - 7 x weekly - 3 sets - 10 reps - Shoulder extension with resistance - Neutral  - 1 x daily - 7 x weekly - 3 sets - 10 reps - Scapular Retraction with Resistance  - 1 x daily - 7 x weekly - 3 sets - 10 reps  PATIENT EDUCATION: Education details: HEP , symptom management;  Person educated: Patient Education method:  Explanation, Demonstration, Tactile cues, Verbal cues, and Handouts Education comprehension: verbalized understanding, returned demonstration, verbal cues required, tactile cues required, and needs further education  HOME EXERCISE PROGRAM: Access Code: [email or web address] URL: [email or web address] Date: 04/05/2023 Prepared by: Lorayne Bender  Exercises - Supine Shoulder Press AAROM in Abduction with Dowel  - 1 x daily - 7 x weekly - 3 sets - 10 reps - Shoulder extension with resistance - Neutral  - 1 x daily - 7 x weekly - 3 sets - 10 reps - Scapular Retraction with Resistance  - 1 x daily - 7 x weekly - 3 sets - 10 reps  ASSESSMENT:  CLINICAL IMPRESSION: Patient is a 57 year old female who presents with acute onset of left shoulder pain.  She has no prior incidence of left shoulder pain until about 6 weeks ago.  She has increased pain with use of the arm and sleeping at night.  She has tenderness to palpation in the biceps groove and mild tenderness to palpation in her upper trap.  Signs and symptoms are consistent with biceps tendinitis and potentially rotator cuff tendinitis.  She has full passive range of motion with mild pain with end range passive external rotation.  She has a significant limitation in active shoulder flexion.  She will be having a knee replacement in 2 weeks.  She hopes to get her shoulder pain down so she is able to effectively use her walker.    OBJECTIVE IMPAIRMENTS: decreased activity tolerance, decreased ROM, decreased strength, impaired UE functional use, and pain.   ACTIVITY LIMITATIONS: carrying, lifting, sleeping, and self feeding  PARTICIPATION LIMITATIONS: meal prep, cleaning, and community activity  PERSONAL FACTORS: 1-2 comorbidities: right knee TKA,   are also affecting patient's functional outcome.   REHAB POTENTIAL: Good  CLINICAL DECISION MAKING: Stable/uncomplicated  EVALUATION COMPLEXITY: Low   GOALS: Goals reviewed with patient?  Yes  SHORT TERM GOALS: Target date: 04/26/2023    Patient will increase active shoulder flexion 150 degrees without pain Baseline: Goal status: INITIAL  2.  Patient will increase gross left shoulder strength to 5 out of 5 Baseline:  Goal status: INITIAL  3.  Patient will increase active internal rotation to L3 Baseline:  Goal status: INITIAL   LONG TERM GOALS: Target date: 05/17/2023    Patient will weight-bear on left shoulder in order to with walker following her left TKA Baseline:  Goal status: INITIAL  2.  Patient will sleep through the night without pain Baseline:  Goal status: INITIAL  3.  Patient will have full exercise program to prevent future exacerbations of left shoulder pain Baseline:  Goal status: INITIAL  PLAN:  PT FREQUENCY: 1-2x/week   PT DURATION: 6 weeks  PLANNED INTERVENTIONS: Therapeutic exercises, Therapeutic activity, Patient/Family education, Self Care, Joint mobilization, DME instructions, Aquatic Therapy, Dry Needling, Cryotherapy, Moist heat, Taping, and Manual therapy Iontophoresis using dexamethasone   PLAN FOR NEXT SESSION: consider joint mobilization; consider ionto. Consider RTC  strengthening; consider supine ABC.    Dessie Coma, PT 04/05/2023, 8:04 AM

## 2023-04-29 ENCOUNTER — Ambulatory Visit (HOSPITAL_BASED_OUTPATIENT_CLINIC_OR_DEPARTMENT_OTHER): Payer: BC Managed Care – PPO | Attending: Sports Medicine | Admitting: Physical Therapy

## 2023-04-29 ENCOUNTER — Other Ambulatory Visit: Payer: Self-pay

## 2023-04-29 DIAGNOSIS — M6281 Muscle weakness (generalized): Secondary | ICD-10-CM | POA: Diagnosis present

## 2023-04-29 DIAGNOSIS — R2689 Other abnormalities of gait and mobility: Secondary | ICD-10-CM | POA: Insufficient documentation

## 2023-04-29 DIAGNOSIS — M25512 Pain in left shoulder: Secondary | ICD-10-CM

## 2023-04-29 DIAGNOSIS — M25662 Stiffness of left knee, not elsewhere classified: Secondary | ICD-10-CM | POA: Insufficient documentation

## 2023-04-29 DIAGNOSIS — R600 Localized edema: Secondary | ICD-10-CM | POA: Diagnosis present

## 2023-04-29 DIAGNOSIS — M25562 Pain in left knee: Secondary | ICD-10-CM | POA: Diagnosis present

## 2023-04-29 NOTE — Therapy (Unsigned)
OUTPATIENT PHYSICAL THERAPY LOWER EXTREMITY EVALUATION   Patient Name: Diana Herman MRN: [id number] DOB:12/19/1965, 57 y.o., female Today's Date: 04/30/2023  END OF SESSION:  PT End of Session - 04/29/23 0941     Visit Number 1    Number of Visits 16    Date for PT Re-Evaluation 06/24/23    Authorization Type BCBS    PT Start Time 0935    PT Stop Time 1015    PT Time Calculation (min) 40 min    Activity Tolerance Patient tolerated treatment well    Behavior During Therapy [facility] for tasks assessed/performed             Past Medical History:  Diagnosis Date   Achilles rupture, right 08/18/2018   Formatting of this note might be different from the original. 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   Severe sepsis with acute organ dysfunction (HCC) 10/15/2021   Sleep apnea    wears cpap    Past Surgical History:  Procedure Laterality Date   ACHILLES TENDON SURGERY Right    COLONOSCOPY  04/14/2021   [facility]   COLONOSCOPY WITH ESOPHAGOGASTRODUODENOSCOPY (EGD)  01/2022   HYSTERECTOMY     hysteroscopy and polypectomy     POLYPECTOMY     TONSILLECTOMY     WISDOM TOOTH EXTRACTION     early 90's    Patient Active Problem List   Diagnosis Date Noted   Depression with anxiety 10/15/2021   Nausea vomiting and diarrhea 08/13/2021   Type 2 diabetes mellitus without complication (HCC)  08/13/2021   Hx of adenomatous colonic polyps 04/28/2021   Long term current use of systemic steroids 09/21/2020   Primary hypertension 08/30/2020   Morbid obesity (HCC) 01/01/2019   Polymyalgia rheumatica (HCC) 08/27/2017   Primary osteoarthritis of left knee 06/30/2016   Primary osteoarthritis of right knee 06/30/2016   Anxiety 01/17/2016   Impaired glucose tolerance 01/17/2016   Mixed hyperlipidemia 01/17/2016   OSA on CPAP 01/17/2016    PCP: Pricilla Holm   REFERRING PROVIDER: Darnell Level MD   REFERRING DIAG: Left TKA   THERAPY DIAG:  Acute pain of left knee  Muscle weakness (generalized)  Other abnormalities of gait and mobility  Localized edema  Stiffness of left knee, not elsewhere classified  Rationale for Evaluation and Treatment: Rehabilitation  ONSET DATE: 05/13/2023  SUBJECTIVE:   SUBJECTIVE STATEMENT: Patient has a long history of bilateral knee pain. She had a right knee replacement on 01/08/2023.  At this time her pain is well-controlled. She had a left knee replacement on 04/23/2023  She is using a roller walker for primary mobility.    PERTINENT HISTORY: Right achilles repair, polymyalgia rheumatica; OA of both knees, sleep apneas. Depression, anxiety PAIN:  Are you  having pain? Yes: NPRS scale: 3/10 Pain location: right knee  Pain description: aching  Aggravating factors: standing and walking  Relieving factors: rest   PRECAUTIONS: Knee  WEIGHT BEARING RESTRICTIONS: Yes WBAT   FALLS:  Has patient fallen in last 6 months? 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   Hobbies:  Play with her dog; walk her dog  PLOF: Independent  PATIENT GOALS:   To be able to do normal activity without increased pain   NEXT MD VISIT:  March 1st    OBJECTIVE:   DIAGNOSTIC FINDINGS: nothing post op  PATIENT SURVEYS:  FOTO    COGNITION: Overall cognitive status: Within  functional limits for tasks assessed     SENSATION: Denies paresthesias   EDEMA:  Nothing significant  MUSCLE LENGTH:  POSTURE: No Significant postural limitations  PALPATION: No unexpected TTP   LOWER EXTREMITY ROM:  Passive ROM Right eval Left eval  Hip flexion    Hip extension    Hip abduction    Hip adduction    Hip internal rotation    Hip external rotation    Knee flexion  85  Knee extension -10 -10  Ankle dorsiflexion    Ankle plantarflexion    Ankle inversion    Ankle eversion     (Blank rows = not tested)  LOWER EXTREMITY MMT:  MMT Right eval Left eval  Hip flexion    Hip extension    Hip abduction    Hip adduction    Hip internal rotation    Hip external rotation    Knee flexion    Knee extension    Ankle dorsiflexion    Ankle plantarflexion    Ankle inversion    Ankle eversion     (Blank rows = not tested) not tested secondary recent surgery   FUNCTIONAL TESTS:  Bed mobility: Uses arms to lift right leg up onto the bed. Sit to stand transfer: Uses arms to push up. GAIT: Using a rolling walker. Decreased weight bearing on the left. Using UE to take weight off.   TODAY'S TREATMENT:                                                                                                                              DATE:  Eval   Exercises - Supine Heel Slide with Strap  - 1 x daily - 7 x weekly - 3 sets - 10 reps - Supine Knee Extension Stretch on Towel Roll  - 1 x daily - 7 x weekly - 3 sets - 10 reps - Heel Raises with Counter Support  - 1 x daily - 7 x weekly - 3 sets - 10 reps - Small Range Straight Leg Raise  - 1 x daily - 7 x weekly - 3 sets - 10 reps - Supine Quad Set  - 1 x daily -  7 x weekly - 3 sets - 10 reps\  Manual: Passive range of motion into flexion and extension, trigger point release to posterior knee.   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 She had a right TKA on 01/08/2023. She presents with expected limitation in ROM, function, and strength. Her pain is well controlled. She would benefit from skilled therapy to be able to improve her ability to walk in the community.  She will have her left knee done at sometime in the future.  She would also like to be able to walk her dog. OBJECTIVE IMPAIRMENTS: Abnormal gait, decreased activity tolerance, decreased knowledge of use of DME, decreased mobility, difficulty walking, decreased ROM, decreased strength, and pain.   ACTIVITY LIMITATIONS: carrying, lifting, bending, sitting, standing, squatting, sleeping, stairs, transfers, bed mobility, bathing, dressing, and locomotion level  PARTICIPATION LIMITATIONS: meal prep, cleaning, laundry, driving, shopping, community activity, occupation, and yard work  PERSONAL FACTORS: 1-2 comorbidities: polymyalgia rheumatica, anxiety, and depression   are also affecting patient's functional outcome.   REHAB POTENTIAL: Good  CLINICAL DECISION MAKING: Stable/uncomplicated  EVALUATION COMPLEXITY: Low   GOALS: Goals reviewed with patient? Yes  SHORT TERM GOALS: Target date: 02/08/2023   Patient will increase left knee flexion to 110 degrees Baseline: Goal status: INITIAL  2.  Patient will demonstrate full left knee  extension Baseline:  Goal status: INITIAL  3.  Patient will ambulate 300 feet with single-point cane showing good safety and weightbearing on the right side Baseline:  Goal status: INITIAL  4.   Patient will be independent with initial exercise program Baseline:  Goal status: INITIAL  LONG TERM GOALS: Target date: 03/08/2023    Patient will go up and down 8 steps with reciprocal gait in order to ambulate in the community Baseline:  Goal status: INITIAL  2.  Patient will ambulate 3000 feet with least restrictive assistive device in order to go shopping and walk her dog Baseline:  Goal status: INITIAL  3.  Patient will be independent with complete exercise program Baseline:  Goal status: INITIAL  4.  Patient will stand for greater than 1 hour without increased right knee pain in order to perform ADLs and IADLs Baseline:  Goal status: INITIAL   PLAN:  PT FREQUENCY: 2x/week  PT DURATION: 8 weeks  PLANNED INTERVENTIONS: Therapeutic exercises, Therapeutic activity, Neuromuscular re-education, Balance training, Gait training, Patient/Family education, Self Care, Joint mobilization, Stair training, Aquatic Therapy, Dry Needling, Cryotherapy, Moist heat, Ultrasound, and Manual therapy  PLAN FOR NEXT SESSION: begin PROM into flexion and extension. Progress to SLR as tolerated and SAQ; progress standing exercises as tolerated. Start nu-step.    Dessie Coma, PT 04/30/2023, 3:24 PM

## 2023-04-30 ENCOUNTER — Encounter (HOSPITAL_BASED_OUTPATIENT_CLINIC_OR_DEPARTMENT_OTHER): Payer: Self-pay | Admitting: Physical Therapy

## 2023-05-01 ENCOUNTER — Ambulatory Visit (HOSPITAL_BASED_OUTPATIENT_CLINIC_OR_DEPARTMENT_OTHER): Payer: BC Managed Care – PPO | Admitting: Physical Therapy

## 2023-05-01 ENCOUNTER — Encounter (HOSPITAL_BASED_OUTPATIENT_CLINIC_OR_DEPARTMENT_OTHER): Payer: Self-pay | Admitting: Physical Therapy

## 2023-05-01 DIAGNOSIS — R600 Localized edema: Secondary | ICD-10-CM

## 2023-05-01 DIAGNOSIS — M25662 Stiffness of left knee, not elsewhere classified: Secondary | ICD-10-CM

## 2023-05-01 DIAGNOSIS — M25562 Pain in left knee: Secondary | ICD-10-CM | POA: Diagnosis not present

## 2023-05-01 DIAGNOSIS — M6281 Muscle weakness (generalized): Secondary | ICD-10-CM

## 2023-05-01 DIAGNOSIS — R2689 Other abnormalities of gait and mobility: Secondary | ICD-10-CM

## 2023-05-01 NOTE — Therapy (Signed)
OUTPATIENT PHYSICAL THERAPY LOWER EXTREMITY EVALUATION   Patient Name: Diana Herman MRN: [id number] DOB:1966-10-15, 57 y.o., female Today's Date: 05/01/2023  END OF SESSION:  PT End of Session - 05/01/23 1310     Visit Number 2    Number of Visits 16    Date for PT Re-Evaluation 06/24/23    Authorization Type [employer]    PT Start Time 1300    PT Stop Time 1344    PT Time Calculation (min) 44 min    Activity Tolerance Patient tolerated treatment well    Behavior During Therapy WFL for tasks assessed/performed             Past Medical History:  Diagnosis Date   Achilles rupture, right 08/18/2018   Formatting of this note might be different from the original. 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   Severe sepsis with acute organ dysfunction (HCC) 10/15/2021   Sleep apnea    wears cpap    Past Surgical History:  Procedure Laterality Date   ACHILLES TENDON SURGERY Right    COLONOSCOPY  04/14/2021   [facility]   COLONOSCOPY WITH ESOPHAGOGASTRODUODENOSCOPY (EGD)  01/2022   HYSTERECTOMY     hysteroscopy and polypectomy     POLYPECTOMY     TONSILLECTOMY     WISDOM TOOTH EXTRACTION     early 90's    Patient Active Problem List   Diagnosis Date Noted   Depression with anxiety 10/15/2021   Nausea vomiting and diarrhea 08/13/2021   Type 2 diabetes mellitus without complication (HCC)  08/13/2021   Hx of adenomatous colonic polyps 04/28/2021   Long term current use of systemic steroids 09/21/2020   Primary hypertension 08/30/2020   Morbid obesity (HCC) 01/01/2019   Polymyalgia rheumatica (HCC) 08/27/2017   Primary osteoarthritis of left knee 06/30/2016   Primary osteoarthritis of right knee 06/30/2016   Anxiety 01/17/2016   Impaired glucose tolerance 01/17/2016   Mixed hyperlipidemia 01/17/2016   OSA on CPAP 01/17/2016    PCP: Pricilla Holm   REFERRING PROVIDER: Darnell Level MD   REFERRING DIAG: Left TKA   THERAPY DIAG:  Muscle weakness (generalized)  Other abnormalities of gait and mobility  Localized edema  Acute pain of left knee  Stiffness of left knee, not elsewhere classified  Rationale for Evaluation and Treatment: Rehabilitation  ONSET DATE: 05/13/2023  SUBJECTIVE:   SUBJECTIVE STATEMENT: The patient is doing well. She is sleeping a little better at night.  PERTINENT HISTORY: Right achilles repair, polymyalgia rheumatica; OA of both knees, sleep apneas. Depression, anxiety PAIN:  Are you having pain? Yes: NPRS scale: 2/10 after manual therapy a 4/10  Pain location: right knee  Pain description: aching  Aggravating factors: standing and walking  Relieving factors: rest  PRECAUTIONS: Knee  WEIGHT BEARING RESTRICTIONS: Yes WBAT   FALLS:  Has patient fallen in last 6 months? 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   Hobbies:  Play with her dog; walk her dog  PLOF: Independent  PATIENT GOALS:   To be able to do normal activity without increased pain   NEXT MD VISIT:  March 1st    OBJECTIVE:   DIAGNOSTIC FINDINGS: nothing post op  PATIENT SURVEYS:  FOTO    COGNITION: Overall cognitive status: Within functional limits for tasks assessed     SENSATION: Denies paresthesias   EDEMA:  Nothing significant  MUSCLE LENGTH:  POSTURE: No  Significant postural limitations  PALPATION: No unexpected TTP   LOWER EXTREMITY ROM:  Passive ROM Right eval Left eval Left   Hip flexion     Hip extension     Hip abduction     Hip adduction     Hip internal rotation     Hip external rotation     Knee flexion  85 90  Knee extension  -10 -5  Ankle dorsiflexion     Ankle plantarflexion     Ankle inversion     Ankle eversion      (Blank rows = not tested)  LOWER EXTREMITY MMT:  MMT Right eval Left eval  Hip flexion    Hip extension    Hip abduction    Hip adduction    Hip internal rotation    Hip external rotation    Knee flexion    Knee extension    Ankle dorsiflexion    Ankle plantarflexion    Ankle inversion    Ankle eversion     (Blank rows = not tested) not tested secondary recent surgery   FUNCTIONAL TESTS:  Bed mobility: Uses arms to lift right leg up onto the bed. Sit to stand transfer: Uses arms to push up. GAIT: Using a rolling walker. Decreased weight bearing on the left. Using UE to take weight off.   TODAY'S TREATMENT:                                                                                                                              DATE:  6/12 PROM into flexion and emphasis on extension;  Trigger point release to medial posterior knee   Quad sets 2x15   SLR 2x10  LAQ in pain free range 2x15   Heel raise -> [employer] pew x20   Standing left hip flexion x12  Standing left hip extension x12    Eval   Exercises - Supine Heel Slide with Strap  - 1 x daily - 7 x weekly - 3 sets - 10 reps - Supine Knee Extension Stretch on Towel Roll  - 1 x daily - 7 x weekly - 3 sets - 10 reps - Heel Raises with Counter Support  -  1 x daily - 7 x weekly - 3 sets - 10 reps - Small Range Straight Leg Raise  - 1 x daily - 7 x weekly - 3 sets - 10 reps - Supine Quad Set  - 1 x daily - 7 x weekly - 3 sets - 10 reps\  Manual: Passive range of motion into flexion and extension, trigger point release  to posterior knee.   PATIENT EDUCATION:  Education details: HEP, symptom management;  Person educated: Patient Education method: Explanation, Demonstration, Tactile cues, Verbal cues, and Handouts Education comprehension: verbalized understanding, returned demonstration, verbal cues required, tactile cues required, and needs further education  HOME EXERCISE PROGRAM: Access Code: [facility] URL: [email or web address] Date: 01/11/2023 Prepared by: Lorayne Bender   ASSESSMENT:  CLINICAL IMPRESSION: The patient is making good progress. She had mild pain with flexion and extension. She tolerated there-ex well. We began to progress to standing exercises. She is getting better with SLR. Therapy will continue to progress as tolerated.   OBJECTIVE IMPAIRMENTS: Abnormal gait, decreased activity tolerance, decreased knowledge of use of DME, decreased mobility, difficulty walking, decreased ROM, decreased strength, and pain.   ACTIVITY LIMITATIONS: carrying, lifting, bending, sitting, standing, squatting, sleeping, stairs, transfers, bed mobility, bathing, dressing, and locomotion level  PARTICIPATION LIMITATIONS: meal prep, cleaning, laundry, driving, shopping, community activity, occupation, and yard work  PERSONAL FACTORS: 1-2 comorbidities: polymyalgia rheumatica, anxiety, and depression   are also affecting patient's functional outcome.   REHAB POTENTIAL: Good  CLINICAL DECISION MAKING: Stable/uncomplicated  EVALUATION COMPLEXITY: Low   GOALS: Goals reviewed with patient? Yes  SHORT TERM GOALS: Target date: 02/08/2023   Patient will increase left knee flexion to 110 degrees Baseline: Goal status: INITIAL  2.  Patient will demonstrate full left knee  extension Baseline:  Goal status: INITIAL  3.  Patient will ambulate 300 feet with single-point cane showing good safety and weightbearing on the right side Baseline:  Goal status: INITIAL  4.  Patient will be  independent with initial exercise program Baseline:  Goal status: INITIAL  LONG TERM GOALS: Target date: 03/08/2023    Patient will go up and down 8 steps with reciprocal gait in order to ambulate in the community Baseline:  Goal status: INITIAL  2.  Patient will ambulate 3000 feet with least restrictive assistive device in order to go shopping and walk her dog Baseline:  Goal status: INITIAL  3.  Patient will be independent with complete exercise program Baseline:  Goal status: INITIAL  4.  Patient will stand for greater than 1 hour without increased right knee pain in order to perform ADLs and IADLs Baseline:  Goal status: INITIAL   PLAN:  PT FREQUENCY: 2x/week  PT DURATION: 8 weeks  PLANNED INTERVENTIONS: Therapeutic exercises, Therapeutic activity, Neuromuscular re-education, Balance training, Gait training, Patient/Family education, Self Care, Joint mobilization, Stair training, Aquatic Therapy, Dry Needling, Cryotherapy, Moist heat, Ultrasound, and Manual therapy  PLAN FOR NEXT SESSION: begin PROM into flexion and extension. Progress to SLR as tolerated and SAQ; progress standing exercises as tolerated. Start nu-step.    Dessie Coma, PT 05/01/2023, 4:09 PM

## 2023-05-09 ENCOUNTER — Ambulatory Visit (HOSPITAL_BASED_OUTPATIENT_CLINIC_OR_DEPARTMENT_OTHER): Payer: BC Managed Care – PPO | Admitting: Physical Therapy

## 2023-05-09 ENCOUNTER — Encounter (HOSPITAL_BASED_OUTPATIENT_CLINIC_OR_DEPARTMENT_OTHER): Payer: Self-pay | Admitting: Physical Therapy

## 2023-05-09 DIAGNOSIS — M25662 Stiffness of left knee, not elsewhere classified: Secondary | ICD-10-CM

## 2023-05-09 DIAGNOSIS — R2689 Other abnormalities of gait and mobility: Secondary | ICD-10-CM

## 2023-05-09 DIAGNOSIS — M25562 Pain in left knee: Secondary | ICD-10-CM | POA: Diagnosis not present

## 2023-05-09 DIAGNOSIS — R600 Localized edema: Secondary | ICD-10-CM

## 2023-05-09 DIAGNOSIS — M6281 Muscle weakness (generalized): Secondary | ICD-10-CM

## 2023-05-09 NOTE — Therapy (Signed)
OUTPATIENT PHYSICAL THERAPY LOWER EXTREMITY EVALUATION   Patient Name: Diana Herman MRN: [id number] DOB:Feb 23, 1966, 57 y.o., female Today's Date: 05/01/2023  END OF SESSION:  PT End of Session - 05/01/23 1310     Visit Number 2    Number of Visits 16    Date for PT Re-Evaluation 06/24/23    Authorization Type [employer]    PT Start Time 1300    PT Stop Time 1344    PT Time Calculation (min) 44 min    Activity Tolerance Patient tolerated treatment well    Behavior During Therapy WFL for tasks assessed/performed             Past Medical History:  Diagnosis Date   Achilles rupture, right 08/18/2018   Formatting of this note might be different from the original. 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   Severe sepsis with acute organ dysfunction (HCC) 10/15/2021   Sleep apnea    wears cpap    Past Surgical History:  Procedure Laterality Date   ACHILLES TENDON SURGERY Right    COLONOSCOPY  04/14/2021   [facility]   COLONOSCOPY WITH ESOPHAGOGASTRODUODENOSCOPY (EGD)  01/2022   HYSTERECTOMY     hysteroscopy and polypectomy     POLYPECTOMY     TONSILLECTOMY     WISDOM TOOTH EXTRACTION     early 90's    Patient Active Problem List   Diagnosis Date Noted   Depression with anxiety 10/15/2021   Nausea vomiting and diarrhea 08/13/2021   Type 2 diabetes mellitus without complication (HCC)  08/13/2021   Hx of adenomatous colonic polyps 04/28/2021   Long term current use of systemic steroids 09/21/2020   Primary hypertension 08/30/2020   Morbid obesity (HCC) 01/01/2019   Polymyalgia rheumatica (HCC) 08/27/2017   Primary osteoarthritis of left knee 06/30/2016   Primary osteoarthritis of right knee 06/30/2016   Anxiety 01/17/2016   Impaired glucose tolerance 01/17/2016   Mixed hyperlipidemia 01/17/2016   OSA on CPAP 01/17/2016    PCP: Pricilla Holm   REFERRING PROVIDER: Darnell Level MD   REFERRING DIAG: Left TKA   THERAPY DIAG:  Muscle weakness (generalized)  Other abnormalities of gait and mobility  Localized edema  Acute pain of left knee  Stiffness of left knee, not elsewhere classified  Rationale for Evaluation and Treatment: Rehabilitation  ONSET DATE: 05/13/2023  SUBJECTIVE:   SUBJECTIVE STATEMENT: The patient tried walking without a device on Monday. It got very sore. She also may have overstretched it.   PERTINENT HISTORY: Right achilles repair, polymyalgia rheumatica; OA of both knees, sleep apneas. Depression, anxiety PAIN:  Are you having pain? Yes: NPRS scale: 2/10 after manual therapy a 4/10  Pain location: right knee  Pain description: aching  Aggravating factors: standing and  walking  Relieving factors: rest   PRECAUTIONS: Knee  WEIGHT BEARING RESTRICTIONS: Yes WBAT   FALLS:  Has patient fallen in last 6 months? 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   Hobbies:  Play with her dog; walk her dog  PLOF: Independent  PATIENT GOALS:   To be able to do normal activity without increased pain   NEXT MD VISIT:  March 1st    OBJECTIVE:   DIAGNOSTIC FINDINGS: nothing post op  PATIENT SURVEYS:  FOTO    COGNITION: Overall cognitive status: Within functional limits for tasks assessed     SENSATION: Denies paresthesias   EDEMA:  Nothing  significant  MUSCLE LENGTH:  POSTURE: No Significant postural limitations  PALPATION: No unexpected TTP   LOWER EXTREMITY ROM:  Passive ROM Right eval Left eval Left  Left 6/20   Hip flexion      Hip extension      Hip abduction      Hip adduction      Hip internal rotation      Hip external rotation      Knee flexion  85 90 106  Knee extension  -10 -5 -5  Ankle dorsiflexion      Ankle plantarflexion      Ankle inversion      Ankle eversion       (Blank rows = not tested)  LOWER EXTREMITY MMT:  MMT Right eval Left eval  Hip flexion    Hip extension    Hip abduction    Hip adduction    Hip internal rotation    Hip external rotation    Knee flexion    Knee extension    Ankle dorsiflexion    Ankle plantarflexion    Ankle inversion    Ankle eversion     (Blank rows = not tested) not tested secondary recent surgery   FUNCTIONAL TESTS:  Bed mobility: Uses arms to lift right leg up onto the bed. Sit to stand transfer: Uses arms to push up. GAIT: Using a rolling walker. Decreased weight bearing on the left. Using UE to take weight off.   TODAY'S TREATMENT:                                                                                                                              DATE:   6/20 PROM into flexion and emphasis on extension;  Trigger point release to medial posterior knee   Quad sets 2x15   SLR 2x10  LAQ in pain free range 2x15   Heel raise -> [employer] x20   Standing bilateral hip flexion x12  Standing left bilateral extension x12    6/12 PROM into flexion and emphasis on extension;  Trigger point release to medial posterior knee   Quad sets 2x15   SLR 2x10  LAQ in pain free range 2x15  Heel raise -> [employer]w x20   Standing left hip flexion x12  Standing left hip extension x12    Eval   Exercises - Supine Heel Slide with Strap  - 1 x daily - 7 x weekly - 3 sets - 10 reps - Supine Knee Extension Stretch on Towel  Roll  - 1 x daily - 7 x weekly - 3 sets - 10 reps - Heel Raises with Counter Support  - 1 x daily - 7 x weekly - 3 sets - 10 reps - Small Range Straight Leg Raise  - 1 x daily - 7 x weekly - 3 sets - 10 reps - Supine Quad Set  - 1 x daily - 7 x weekly - 3 sets - 10 reps\  Manual: Passive range of motion into flexion and extension, trigger point release to posterior knee.   PATIENT EDUCATION:  Education details: HEP, symptom management;  Person educated: Patient Education method: Explanation, Demonstration, Tactile cues, Verbal cues, and Handouts Education comprehension: verbalized understanding, returned demonstration, verbal cues required, tactile cues required, and needs further education  HOME EXERCISE PROGRAM: Access Code: [facility] URL: [email or web address] Date: 01/11/2023 Prepared by: Lorayne Bender   ASSESSMENT:  CLINICAL IMPRESSION: The patient was a little more sore today but overall she is doing well. She got her bandage off so we were able to do patella mobility today. She has great medial/lateral mobility and somewhat limited but good inferior/ superior movement. She has been walking with her cane. Her technique is good with her cane. Therapy will continue to progress as tolerated.  OBJECTIVE IMPAIRMENTS: Abnormal gait, decreased activity tolerance, decreased knowledge of use of DME, decreased mobility, difficulty walking, decreased ROM, decreased strength, and pain.   ACTIVITY LIMITATIONS: carrying, lifting, bending, sitting, standing, squatting, sleeping, stairs, transfers, bed mobility, bathing, dressing, and locomotion level  PARTICIPATION LIMITATIONS: meal prep, cleaning, laundry, driving, shopping, community activity, occupation, and yard work  PERSONAL FACTORS: 1-2 comorbidities: polymyalgia rheumatica, anxiety, and depression   are also affecting patient's functional outcome.   REHAB POTENTIAL: Good  CLINICAL DECISION MAKING:  Stable/uncomplicated  EVALUATION COMPLEXITY: Low   GOALS: Goals reviewed with patient? Yes  SHORT TERM GOALS: Target date: 02/08/2023   Patient will increase left knee flexion to 110 degrees Baseline: Goal status: INITIAL  2.  Patient will demonstrate full left knee  extension Baseline:  Goal status: INITIAL  3.  Patient will ambulate 300 feet with single-point cane showing good safety and weightbearing on the right side Baseline:  Goal status: INITIAL  4.  Patient will be independent with initial exercise program Baseline:  Goal status: INITIAL  LONG TERM GOALS: Target date: 03/08/2023    Patient will go up and down 8 steps with reciprocal gait in order to ambulate in the community Baseline:  Goal status: INITIAL  2.  Patient will ambulate 3000 feet with least restrictive assistive device in order to go shopping and walk her dog Baseline:  Goal status: INITIAL  3.  Patient will be independent with complete exercise program Baseline:  Goal status: INITIAL  4.  Patient will stand for greater than 1 hour without increased right knee pain in order to perform ADLs and IADLs Baseline:  Goal status: INITIAL   PLAN:  PT FREQUENCY: 2x/week  PT DURATION: 8 weeks  PLANNED INTERVENTIONS: Therapeutic exercises, Therapeutic activity, Neuromuscular re-education, Balance training, Gait training, Patient/Family education, Self Care, Joint mobilization, Stair training, Aquatic Therapy, Dry Needling, Cryotherapy, Moist heat, Ultrasound, and Manual therapy  PLAN  FOR NEXT SESSION: begin PROM into flexion and extension. Progress to SLR as tolerated and SAQ; progress standing exercises as tolerated. Start nu-step.    Dessie Coma, PT 05/01/2023, 4:09 PM

## 2023-05-11 ENCOUNTER — Encounter (HOSPITAL_BASED_OUTPATIENT_CLINIC_OR_DEPARTMENT_OTHER): Payer: BC Managed Care – PPO | Admitting: Physical Therapy

## 2023-05-14 ENCOUNTER — Ambulatory Visit (HOSPITAL_BASED_OUTPATIENT_CLINIC_OR_DEPARTMENT_OTHER): Payer: BC Managed Care – PPO

## 2023-05-14 ENCOUNTER — Encounter (HOSPITAL_BASED_OUTPATIENT_CLINIC_OR_DEPARTMENT_OTHER): Payer: Self-pay

## 2023-05-14 DIAGNOSIS — M25562 Pain in left knee: Secondary | ICD-10-CM | POA: Diagnosis not present

## 2023-05-14 DIAGNOSIS — M25662 Stiffness of left knee, not elsewhere classified: Secondary | ICD-10-CM

## 2023-05-14 DIAGNOSIS — R2689 Other abnormalities of gait and mobility: Secondary | ICD-10-CM

## 2023-05-14 DIAGNOSIS — M6281 Muscle weakness (generalized): Secondary | ICD-10-CM

## 2023-05-14 DIAGNOSIS — R600 Localized edema: Secondary | ICD-10-CM

## 2023-05-14 NOTE — Therapy (Signed)
OUTPATIENT PHYSICAL THERAPY LOWER EXTREMITY TREATMENT   Patient Name: Diana Herman MRN: [id number] DOB:June 29, 1966, 57 y.o., female Today's Date: 05/14/2023  END OF SESSION:  PT End of Session - 05/14/23 1528     Visit Number 4    Number of Visits 16    Date for PT Re-Evaluation 06/24/23    Authorization Type BCBS    PT Start Time 1518    PT Stop Time 1600    PT Time Calculation (min) 42 min    Activity Tolerance Patient tolerated treatment well    Behavior During Therapy [facility] for tasks assessed/performed              Past Medical History:  Diagnosis Date   Achilles rupture, right 08/18/2018   Formatting of this note might be different from the original. 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   Severe sepsis with acute organ dysfunction (HCC) 10/15/2021   Sleep apnea    wears cpap    Past Surgical History:  Procedure Laterality Date   ACHILLES TENDON SURGERY Right    COLONOSCOPY  04/14/2021   [facility]   COLONOSCOPY WITH ESOPHAGOGASTRODUODENOSCOPY (EGD)  01/2022   HYSTERECTOMY     hysteroscopy and polypectomy     POLYPECTOMY     TONSILLECTOMY     WISDOM TOOTH EXTRACTION     early 90's    Patient Active Problem List   Diagnosis Date Noted   Depression with anxiety 10/15/2021   Nausea vomiting and diarrhea 08/13/2021   Type 2 diabetes mellitus without complication (HCC)  08/13/2021   Hx of adenomatous colonic polyps 04/28/2021   Long term current use of systemic steroids 09/21/2020   Primary hypertension 08/30/2020   Morbid obesity (HCC) 01/01/2019   Polymyalgia rheumatica (HCC) 08/27/2017   Primary osteoarthritis of left knee 06/30/2016   Primary osteoarthritis of right knee 06/30/2016   Anxiety 01/17/2016   Impaired glucose tolerance 01/17/2016   Mixed hyperlipidemia 01/17/2016   OSA on CPAP 01/17/2016    PCP: Pricilla Holm   REFERRING PROVIDER: Darnell Level MD   REFERRING DIAG: Left TKA   THERAPY DIAG:  Muscle weakness (generalized)  Other abnormalities of gait and mobility  Localized edema  Acute pain of left knee  Stiffness of left knee, not elsewhere classified  Rationale for Evaluation and Treatment: Rehabilitation  ONSET DATE: 05/13/2023  SUBJECTIVE:   SUBJECTIVE STATEMENT: 1/10 pain level at entry. Having some pain at nighttime.   PERTINENT HISTORY: Right achilles repair, polymyalgia rheumatica; OA of both knees, sleep apneas. Depression, anxiety PAIN:  Are you having pain? Yes: NPRS scale: 2/10 after manual therapy a 4/10  Pain location: right knee  Pain description: aching  Aggravating factors: standing and walking  Relieving factors: rest   PRECAUTIONS:  Knee  WEIGHT BEARING RESTRICTIONS: Yes WBAT   FALLS:  Has patient fallen in last 6 months? 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   Hobbies:  Play with her dog; walk her dog  PLOF: Independent  PATIENT GOALS:   To be able to do normal activity without increased pain   NEXT MD VISIT:  March 1st    OBJECTIVE:   DIAGNOSTIC FINDINGS: nothing post op  PATIENT SURVEYS:  FOTO    COGNITION: Overall cognitive status: Within functional limits for tasks assessed     SENSATION: Denies paresthesias   EDEMA:  Nothing significant  MUSCLE LENGTH:  POSTURE: No Significant  postural limitations  PALPATION: No unexpected TTP   LOWER EXTREMITY ROM:  Passive ROM Right eval Left eval Left  Left 6/20   Hip flexion      Hip extension      Hip abduction      Hip adduction      Hip internal rotation      Hip external rotation      Knee flexion  85 90 106  Knee extension  -10 -5 -5  Ankle dorsiflexion      Ankle plantarflexion      Ankle inversion      Ankle eversion       (Blank rows = not tested)  LOWER EXTREMITY MMT:  MMT Right eval Left eval  Hip flexion    Hip extension    Hip abduction    Hip adduction    Hip internal rotation    Hip external rotation    Knee flexion    Knee extension    Ankle dorsiflexion    Ankle plantarflexion    Ankle inversion    Ankle eversion     (Blank rows = not tested) not tested secondary recent surgery   FUNCTIONAL TESTS:  Bed mobility: Uses arms to lift right leg up onto the bed. Sit to stand transfer: Uses arms to push up. GAIT: Using a rolling walker. Decreased weight bearing on the left. Using UE to take weight off.   TODAY'S TREATMENT:                                                                                                                              DATE:   6/25 PROM into flexion and extension Scifit bike, partial and full revs x30min Bridge 2x10 SAQ- 1x10 (some medial knee pain) SLR 2x10 Heel prop  LAQ in pain free range 5" hold 2x10 Hamstring curl seated EOB-RTB 2x10  Heel raise 2x10 Standing march 2x10 Partial squats 2x10   6/20 PROM into flexion and emphasis on extension;  Trigger point release to medial posterior knee   Quad sets 2x15   SLR 2x10  LAQ in pain free range 2x15   Heel raise -> [employer] x20   Standing bilateral hip  flexion x12  Standing left bilateral extension x12    6/12 PROM into flexion and emphasis on extension;  Trigger point release to medial posterior knee   Quad sets 2x15   SLR 2x10  LAQ in pain free range 2x15   Heel  raise -> [employer] x20   Standing left hip flexion x12  Standing left hip extension x12    Eval   Exercises - Supine Heel Slide with Strap  - 1 x daily - 7 x weekly - 3 sets - 10 reps - Supine Knee Extension Stretch on Towel Roll  - 1 x daily - 7 x weekly - 3 sets - 10 reps - Heel Raises with Counter Support  - 1 x daily - 7 x weekly - 3 sets - 10 reps - Small Range Straight Leg Raise  - 1 x daily - 7 x weekly - 3 sets - 10 reps - Supine Quad Set  - 1 x daily - 7 x weekly - 3 sets - 10 reps\  Manual: Passive range of motion into flexion and extension, trigger point release to posterior knee.   PATIENT EDUCATION:  Education details: HEP, symptom management;  Person educated: Patient Education method: Explanation, Demonstration, Tactile cues, Verbal cues, and Handouts Education comprehension: verbalized understanding, returned demonstration, verbal cues required, tactile cues required, and needs further education  HOME EXERCISE PROGRAM: Access Code: [facility] URL: [email or web address] Date: 01/11/2023 Prepared by: Lorayne Bender   ASSESSMENT:  CLINICAL IMPRESSION: Pt had good tolerance to progressions of ROM and strengthening interventions. Able to complete partial and full revolutions on recumbent bike today. Required cues to stay within pain limitations.  She does note medial knee discomfort with SAQ and states she experiences a similar sensation on contralateral knee as well. Worked on gait training without SPC, which pt demonstrates safety with and minimal deviation with. She was instructed to use cane as needed during/after periods of greater activity, but can go without it for shorter durations. Will continue to progress towards.  OBJECTIVE IMPAIRMENTS: Abnormal gait, decreased activity tolerance, decreased knowledge of use of DME, decreased mobility, difficulty walking, decreased ROM, decreased strength, and pain.   ACTIVITY LIMITATIONS: carrying, lifting,  bending, sitting, standing, squatting, sleeping, stairs, transfers, bed mobility, bathing, dressing, and locomotion level  PARTICIPATION LIMITATIONS: meal prep, cleaning, laundry, driving, shopping, community activity, occupation, and yard work  PERSONAL FACTORS: 1-2 comorbidities: polymyalgia rheumatica, anxiety, and depression   are also affecting patient's functional outcome.   REHAB POTENTIAL: Good  CLINICAL DECISION MAKING: Stable/uncomplicated  EVALUATION COMPLEXITY: Low   GOALS: Goals reviewed with patient? Yes  SHORT TERM GOALS: Target date: 02/08/2023   Patient will increase left knee flexion to 110 degrees Baseline: Goal status: INITIAL  2.  Patient will demonstrate full left knee  extension Baseline:  Goal status: INITIAL  3.  Patient will ambulate 300 feet with single-point cane showing good safety and weightbearing on the right side Baseline:  Goal status: INITIAL  4.  Patient will be independent with initial exercise program Baseline:  Goal status: INITIAL  LONG TERM GOALS: Target date: 03/08/2023    Patient will go up and down 8 steps with reciprocal gait in order to ambulate in the community Baseline:  Goal status: INITIAL  2.  Patient will ambulate 3000 feet with least restrictive assistive device in order to go shopping and walk her dog Baseline:  Goal status: INITIAL  3.  Patient will be independent with complete exercise program Baseline:  Goal status: INITIAL  4.  Patient will stand for greater than 1 hour without increased right knee pain in order to perform ADLs and IADLs Baseline:  Goal status: INITIAL   PLAN:  PT FREQUENCY: 2x/week  PT DURATION: 8 weeks  PLANNED INTERVENTIONS: Therapeutic exercises, Therapeutic activity, Neuromuscular re-education, Balance training, Gait training, Patient/Family education, Self Care, Joint mobilization, Stair training, Aquatic Therapy, Dry Needling, Cryotherapy, Moist heat, Ultrasound, and Manual  therapy  PLAN FOR NEXT SESSION: begin PROM into flexion and extension. Progress to SLR as tolerated and SAQ; progress standing exercises as tolerated. Start nu-step.    Donnel Saxon Chizara Mena, PTA 05/14/2023, 4:57 PM

## 2023-05-17 ENCOUNTER — Ambulatory Visit (HOSPITAL_BASED_OUTPATIENT_CLINIC_OR_DEPARTMENT_OTHER): Payer: BC Managed Care – PPO | Admitting: Physical Therapy

## 2023-05-17 DIAGNOSIS — M25562 Pain in left knee: Secondary | ICD-10-CM | POA: Diagnosis not present

## 2023-05-17 DIAGNOSIS — R600 Localized edema: Secondary | ICD-10-CM

## 2023-05-17 DIAGNOSIS — M6281 Muscle weakness (generalized): Secondary | ICD-10-CM

## 2023-05-17 DIAGNOSIS — R2689 Other abnormalities of gait and mobility: Secondary | ICD-10-CM

## 2023-05-17 DIAGNOSIS — M25662 Stiffness of left knee, not elsewhere classified: Secondary | ICD-10-CM

## 2023-05-17 NOTE — Therapy (Signed)
OUTPATIENT PHYSICAL THERAPY LOWER EXTREMITY TREATMENT   Patient Name: Diana Herman MRN: [id number] DOB:1966/08/04, 57 y.o., female Today's Date: 05/14/2023  END OF SESSION:  PT End of Session - 05/14/23 1528     Visit Number 4    Number of Visits 16    Date for PT Re-Evaluation 06/24/23    Authorization Type BCBS    PT Start Time 1518    PT Stop Time 1600    PT Time Calculation (min) 42 min    Activity Tolerance Patient tolerated treatment well    Behavior During Therapy [facility] for tasks assessed/performed              Past Medical History:  Diagnosis Date   Achilles rupture, right 08/18/2018   Formatting of this note might be different from the original. 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   Severe sepsis with acute organ dysfunction (HCC) 10/15/2021   Sleep apnea    wears cpap    Past Surgical History:  Procedure Laterality Date   ACHILLES TENDON SURGERY Right    COLONOSCOPY  04/14/2021   [facility]   COLONOSCOPY WITH ESOPHAGOGASTRODUODENOSCOPY (EGD)  01/2022   HYSTERECTOMY     hysteroscopy and polypectomy     POLYPECTOMY     TONSILLECTOMY     WISDOM TOOTH EXTRACTION     early 90's    Patient Active Problem List   Diagnosis Date Noted   Depression with anxiety 10/15/2021   Nausea vomiting and diarrhea 08/13/2021   Type 2 diabetes mellitus without complication (HCC)  08/13/2021   Hx of adenomatous colonic polyps 04/28/2021   Long term current use of systemic steroids 09/21/2020   Primary hypertension 08/30/2020   Morbid obesity (HCC) 01/01/2019   Polymyalgia rheumatica (HCC) 08/27/2017   Primary osteoarthritis of left knee 06/30/2016   Primary osteoarthritis of right knee 06/30/2016   Anxiety 01/17/2016   Impaired glucose tolerance 01/17/2016   Mixed hyperlipidemia 01/17/2016   OSA on CPAP 01/17/2016    PCP: Pricilla Holm   REFERRING PROVIDER: Darnell Level MD   REFERRING DIAG: Left TKA   THERAPY DIAG:  Muscle weakness (generalized)  Other abnormalities of gait and mobility  Localized edema  Acute pain of left knee  Stiffness of left knee, not elsewhere classified  Rationale for Evaluation and Treatment: Rehabilitation  ONSET DATE: 05/13/2023  SUBJECTIVE:   SUBJECTIVE STATEMENT: Patient is walking without a walker. She is doing well overall  PERTINENT HISTORY: Right achilles repair, polymyalgia rheumatica; OA of both knees, sleep apneas. Depression, anxiety PAIN:  Are you having pain? Yes: NPRS scale: 2/10 after manual therapy a 4/10  Pain location: right knee  Pain description: aching  Aggravating factors: standing and walking  Relieving factors: rest   PRECAUTIONS:  Knee  WEIGHT BEARING RESTRICTIONS: Yes WBAT   FALLS:  Has patient fallen in last 6 months? 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   Hobbies:  Play with her dog; walk her dog  PLOF: Independent  PATIENT GOALS:   To be able to do normal activity without increased pain   NEXT MD VISIT:  March 1st    OBJECTIVE:   DIAGNOSTIC FINDINGS: nothing post op  PATIENT SURVEYS:  FOTO    COGNITION: Overall cognitive status: Within functional limits for tasks assessed     SENSATION: Denies paresthesias   EDEMA:  Nothing significant  MUSCLE LENGTH:  POSTURE: No  Significant postural limitations  PALPATION: No unexpected TTP   LOWER EXTREMITY ROM:  Passive ROM Right eval Left eval Left  Left 6/20   Hip flexion      Hip extension      Hip abduction      Hip adduction      Hip internal rotation      Hip external rotation      Knee flexion  85 90 106  Knee extension  -10 -5 -5  Ankle dorsiflexion      Ankle plantarflexion      Ankle inversion      Ankle eversion       (Blank rows = not tested)  LOWER EXTREMITY MMT:  MMT Right eval Left eval  Hip flexion    Hip extension    Hip abduction    Hip adduction    Hip internal rotation    Hip external rotation    Knee flexion    Knee extension    Ankle dorsiflexion    Ankle plantarflexion    Ankle inversion    Ankle eversion     (Blank rows = not tested) not tested secondary recent surgery   FUNCTIONAL TESTS:  Bed mobility: Uses arms to lift right leg up onto the bed. Sit to stand transfer: Uses arms to push up. GAIT: Using a rolling walker. Decreased weight bearing on the left. Using UE to take weight off.   TODAY'S TREATMENT:                                                                                                                              DATE:  6/28  Nu-step 6 min L4 cuing for ROM   Quad set x20  SLR 3x10  Bridge 2x10   Manual: PROM into flexion and emphasis on extension; Trigger point release to medial posterior knee   LAQ in pain free range 5" hold 2x10  Heel raise 2x10 Standing march 2x10 Partial squats 2x10  6/25 PROM into flexion and extension Scifit bike, partial and full revs x58min Bridge 2x10 SAQ- 1x10 (some medial knee pain) SLR 2x10 Heel prop  LAQ in pain free range 5" hold 2x10 Hamstring curl seated EOB-RTB 2x10  Heel raise 2x10 Standing march 2x10 Partial squats 2x10   6/20 PROM into flexion and emphasis on extension;  Trigger point release to medial posterior knee   Quad sets 2x15   SLR 2x10  LAQ in pain free  range 2x15   Heel raise -> [employer] x20   Standing bilateral hip flexion x12  Standing left bilateral extension x12    6/12 PROM into flexion and emphasis on extension;  Trigger point release to medial posterior knee   Quad sets 2x15   SLR 2x10  LAQ in pain free range 2x15   Heel raise -> [employer] x20   Standing left hip flexion x12  Standing left hip extension x12    Eval   Exercises - Supine Heel Slide with Strap  - 1 x daily - 7 x weekly - 3 sets - 10 reps - Supine Knee Extension Stretch on Towel Roll  - 1 x daily - 7 x weekly - 3 sets - 10 reps - Heel Raises with Counter Support  - 1 x daily - 7 x weekly - 3 sets - 10 reps - Small Range Straight Leg Raise  - 1 x daily - 7 x weekly - 3 sets - 10 reps - Supine Quad Set  - 1 x daily - 7 x weekly - 3 sets - 10 reps\  Manual: Passive range of motion into flexion and extension, trigger point release to posterior knee.   PATIENT EDUCATION:  Education details: HEP, symptom management;  Person educated: Patient Education method: Explanation, Demonstration, Tactile cues, Verbal cues, and Handouts Education comprehension: verbalized understanding, returned demonstration, verbal cues required, tactile cues required, and needs further education  HOME EXERCISE PROGRAM: Access Code: [facility] URL: [email or web address] Date: 01/11/2023 Prepared by: Lorayne Bender   ASSESSMENT:  CLINICAL IMPRESSION: The patient continues to progress well. her range was about the same today with a mild improvement in flexion. She is walking without a canr. She had no significant increase in pain with her exercises just mild fatigue. We added in a low step just to get her thinking about steps over the weekend. We can progress her into stair training as tolerated.    OBJECTIVE IMPAIRMENTS: Abnormal gait, decreased activity tolerance, decreased knowledge of use of DME, decreased mobility, difficulty walking, decreased ROM,  decreased strength, and pain.   ACTIVITY LIMITATIONS: carrying, lifting, bending, sitting, standing, squatting, sleeping, stairs, transfers, bed mobility, bathing, dressing, and locomotion level  PARTICIPATION LIMITATIONS: meal prep, cleaning, laundry, driving, shopping, community activity, occupation, and yard work  PERSONAL FACTORS: 1-2 comorbidities: polymyalgia rheumatica, anxiety, and depression   are also affecting patient's functional outcome.   REHAB POTENTIAL: Good  CLINICAL DECISION MAKING: Stable/uncomplicated  EVALUATION COMPLEXITY: Low   GOALS: Goals reviewed with patient? Yes  SHORT TERM GOALS: Target date: 02/08/2023   Patient will increase left knee flexion to 110 degrees Baseline: Goal status: INITIAL  2.  Patient will demonstrate full left knee  extension Baseline:  Goal status: INITIAL  3.  Patient will ambulate 300 feet with single-point cane showing good safety and weightbearing on the right side Baseline:  Goal status: INITIAL  4.  Patient will be independent with initial exercise program Baseline:  Goal status: INITIAL  LONG TERM GOALS: Target date: 03/08/2023    Patient will go up and down 8 steps with reciprocal gait in order to ambulate in the community Baseline:  Goal status: INITIAL  2.  Patient will ambulate 3000 feet with least restrictive assistive device  in order to go shopping and walk her dog Baseline:  Goal status: INITIAL  3.  Patient will be independent with complete exercise program Baseline:  Goal status: INITIAL  4.  Patient will stand for greater than 1 hour without increased right knee pain in order to perform ADLs and IADLs Baseline:  Goal status: INITIAL   PLAN:  PT FREQUENCY: 2x/week  PT DURATION: 8 weeks  PLANNED INTERVENTIONS: Therapeutic exercises, Therapeutic activity, Neuromuscular re-education, Balance training, Gait training, Patient/Family education, Self Care, Joint mobilization, Stair training, Aquatic  Therapy, Dry Needling, Cryotherapy, Moist heat, Ultrasound, and Manual therapy  PLAN FOR NEXT SESSION: begin PROM into flexion and extension. Progress to SLR as tolerated and SAQ; progress standing exercises as tolerated. Start nu-step.    Lorayne Bender PT DPT 05/14/2023, 4:57 PM

## 2023-05-20 ENCOUNTER — Ambulatory Visit (HOSPITAL_BASED_OUTPATIENT_CLINIC_OR_DEPARTMENT_OTHER): Payer: BC Managed Care – PPO | Attending: Sports Medicine

## 2023-05-20 ENCOUNTER — Encounter (HOSPITAL_BASED_OUTPATIENT_CLINIC_OR_DEPARTMENT_OTHER): Payer: Self-pay

## 2023-05-20 DIAGNOSIS — M25562 Pain in left knee: Secondary | ICD-10-CM

## 2023-05-20 DIAGNOSIS — M25662 Stiffness of left knee, not elsewhere classified: Secondary | ICD-10-CM

## 2023-05-20 DIAGNOSIS — R600 Localized edema: Secondary | ICD-10-CM

## 2023-05-20 DIAGNOSIS — R2689 Other abnormalities of gait and mobility: Secondary | ICD-10-CM

## 2023-05-20 DIAGNOSIS — M6281 Muscle weakness (generalized): Secondary | ICD-10-CM | POA: Diagnosis present

## 2023-05-20 NOTE — Therapy (Signed)
OUTPATIENT PHYSICAL THERAPY LOWER EXTREMITY TREATMENT   Patient Name: Diana Herman MRN: [id number] DOB:1966-08-25, 57 y.o., female Today's Date: 05/20/2023  END OF SESSION:  PT End of Session - 05/20/23 1313     Visit Number 6    Number of Visits 16    Date for PT Re-Evaluation 06/24/23    PT Start Time 1147    PT Stop Time 1230    PT Time Calculation (min) 43 min    Activity Tolerance Patient tolerated treatment well    Behavior During Therapy [facility] for tasks assessed/performed              Past Medical History:  Diagnosis Date   Achilles rupture, right 08/18/2018   Formatting of this note might be different from the original. 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   Severe sepsis with acute organ dysfunction (HCC) 10/15/2021   Sleep apnea    wears cpap    Past Surgical History:  Procedure Laterality Date   ACHILLES TENDON SURGERY Right    COLONOSCOPY  04/14/2021   [facility]   COLONOSCOPY WITH ESOPHAGOGASTRODUODENOSCOPY (EGD)  01/2022   HYSTERECTOMY     hysteroscopy and polypectomy     POLYPECTOMY     TONSILLECTOMY     WISDOM TOOTH EXTRACTION     early 90's    Patient Active Problem List   Diagnosis Date Noted   Depression with anxiety 10/15/2021   Nausea vomiting and diarrhea 08/13/2021   Type 2 diabetes mellitus without complication (HCC) 08/13/2021   Hx of adenomatous  colonic polyps 04/28/2021   Long term current use of systemic steroids 09/21/2020   Primary hypertension 08/30/2020   Morbid obesity (HCC) 01/01/2019   Polymyalgia rheumatica (HCC) 08/27/2017   Primary osteoarthritis of left knee 06/30/2016   Primary osteoarthritis of right knee 06/30/2016   Anxiety 01/17/2016   Impaired glucose tolerance 01/17/2016   Mixed hyperlipidemia 01/17/2016   OSA on CPAP 01/17/2016    PCP: Pricilla Holm   REFERRING PROVIDER: Darnell Level MD   REFERRING DIAG: Left TKA   THERAPY DIAG:  Muscle weakness (generalized)  Other abnormalities of gait and mobility  Localized edema  Acute pain of left knee  Stiffness of left knee, not elsewhere classified  Rationale for Evaluation and Treatment: Rehabilitation  ONSET DATE: 05/13/2023  SUBJECTIVE:   SUBJECTIVE STATEMENT: Patient is walking without AD, thouh had recent increase in L knee stifneess for unknown reason. PERTINENT HISTORY: Right achilles repair, polymyalgia rheumatica; OA of both knees, sleep apneas. Depression, anxiety PAIN:  Are you having pain? Yes: NPRS scale: 2/10 after manual therapy a 4/10  Pain location: right knee  Pain description: aching  Aggravating factors: standing and walking  Relieving factors: rest   PRECAUTIONS: Knee  WEIGHT BEARING RESTRICTIONS: Yes WBAT   FALLS:  Has patient fallen in last 6 months? 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   Hobbies:  Play with her dog; walk her dog  PLOF: Independent  PATIENT GOALS:   To be able to do normal activity without increased pain   NEXT MD VISIT:  March 1st    OBJECTIVE:   DIAGNOSTIC FINDINGS: nothing post op  PATIENT SURVEYS:  FOTO    COGNITION: Overall cognitive status: Within functional limits for tasks assessed     SENSATION: Denies paresthesias   EDEMA:  Nothing significant  MUSCLE LENGTH:  POSTURE: No  Significant postural limitations  PALPATION: No unexpected TTP   LOWER EXTREMITY ROM:  Passive ROM Right eval Left eval Left  Left 6/20   Hip flexion      Hip extension      Hip abduction      Hip adduction      Hip internal rotation      Hip external rotation      Knee flexion  85 90 106  Knee extension  -10 -5 -5  Ankle dorsiflexion      Ankle plantarflexion      Ankle inversion      Ankle eversion       (Blank rows = not tested)  LOWER EXTREMITY MMT:  MMT Right eval Left eval  Hip flexion    Hip extension    Hip abduction    Hip adduction    Hip internal rotation    Hip external rotation    Knee flexion    Knee extension    Ankle dorsiflexion    Ankle plantarflexion    Ankle inversion    Ankle eversion     (Blank rows = not tested) not tested secondary recent surgery   FUNCTIONAL TESTS:  Bed mobility: Uses arms to lift right leg up onto the bed. Sit to stand transfer: Uses arms to push up. GAIT: Using a rolling walker. Decreased weight bearing on the left. Using UE to take weight off.   TODAY'S TREATMENT:                                                                                                                              DATE:   7/1  Sci fit biks 5 min for ROM  Long sit HSS- 30sec x3L SLR 3x10  Bridge 2x10 staggered-L behind  Manual: PROM into flexion/extension. Tib/fem mobilizations with posterior glide grade II-III  LAQ in pain free range 5" hold 2x10, 3#  Heel raise 2x10 Standing march 2x10 Partial squats 2x10 Sit to stands x10 from low plinth Standing HSC 2x10 Gait- 39ft no AD STM to medial HS   6/28  Nu-step 6 min L4 cuing for ROM   Quad set x20  SLR 3x10  Bridge 2x10   Manual: PROM into  flexion and emphasis on extension; Trigger point release to medial posterior knee   LAQ in pain free range 5" hold 2x10  Heel raise 2x10 Standing march 2x10 Partial squats 2x10  6/25 PROM into flexion and extension Scifit  bike, partial and full revs x75min Bridge 2x10 SAQ- 1x10 (some medial knee pain) SLR 2x10 Heel prop  LAQ in pain free range 5" hold 2x10 Hamstring curl seated EOB-RTB 2x10  Heel raise 2x10 Standing march 2x10 Partial squats 2x10  PATIENT EDUCATION:  Education details: HEP, symptom management;  Person educated: Patient Education method: Explanation, Demonstration, Tactile cues, Verbal cues, and Handouts Education comprehension: verbalized understanding, returned demonstration, verbal cues required, tactile cues required, and needs further education  HOME EXERCISE PROGRAM: Access Code: [facility] URL: [email or web address] Date: 01/11/2023 Prepared by: Lorayne Bender   ASSESSMENT:  CLINICAL IMPRESSION: Pt had good tolerance for therex today, though did have increased tightness in knee. With gait, she does have mild deviations, but overall safe and steady gait pattern. Trialled waking marches to challenge stability, which caused a pain in medial/posterior knee. Upon palpation, medial distal HS were very TTP. Spent some time on STM to this area which resulted in mild improvement. Will monitor this and progress as tolerated.   OBJECTIVE IMPAIRMENTS: Abnormal gait, decreased activity tolerance, decreased knowledge of use of DME, decreased mobility, difficulty walking, decreased ROM, decreased strength, and pain.   ACTIVITY LIMITATIONS: carrying, lifting, bending, sitting, standing, squatting, sleeping, stairs, transfers, bed mobility, bathing, dressing, and locomotion level  PARTICIPATION LIMITATIONS: meal prep, cleaning, laundry, driving, shopping, community activity, occupation, and yard work  PERSONAL FACTORS: 1-2 comorbidities: polymyalgia rheumatica, anxiety, and depression   are also affecting patient's functional outcome.   REHAB POTENTIAL: Good  CLINICAL DECISION MAKING: Stable/uncomplicated  EVALUATION COMPLEXITY: Low   GOALS: Goals reviewed with  patient? Yes  SHORT TERM GOALS: Target date: 02/08/2023   Patient will increase left knee flexion to 110 degrees Baseline: Goal status: INITIAL  2.  Patient will demonstrate full left knee  extension Baseline:  Goal status: INITIAL  3.  Patient will ambulate 300 feet with single-point cane showing good safety and weightbearing on the right side Baseline:  Goal status: INITIAL  4.  Patient will be independent with initial exercise program Baseline:  Goal status: INITIAL  LONG TERM GOALS: Target date: 03/08/2023    Patient will go up and down 8 steps with reciprocal gait in order to ambulate in the community Baseline:  Goal status: INITIAL  2.  Patient will ambulate 3000 feet with least restrictive assistive device in order to go shopping and walk her dog Baseline:  Goal status: INITIAL  3.  Patient will be independent with complete exercise program Baseline:  Goal status: INITIAL  4.  Patient will stand for greater than 1 hour without increased right knee pain in order to perform ADLs and IADLs Baseline:  Goal status: INITIAL   PLAN:  PT FREQUENCY: 2x/week  PT DURATION: 8 weeks  PLANNED INTERVENTIONS: Therapeutic exercises, Therapeutic activity, Neuromuscular re-education, Balance training, Gait training, Patient/Family education, Self Care, Joint mobilization, Stair training, Aquatic Therapy, Dry Needling, Cryotherapy, Moist heat, Ultrasound, and Manual therapy  PLAN FOR NEXT SESSION: begin PROM into flexion and extension. Progress to SLR as tolerated and SAQ; progress standing exercises as tolerated. Start nu-step.    Riki Altes, PTA  05/20/2023, 2:26 PM

## 2023-05-22 ENCOUNTER — Encounter (HOSPITAL_BASED_OUTPATIENT_CLINIC_OR_DEPARTMENT_OTHER): Payer: Self-pay

## 2023-05-22 ENCOUNTER — Ambulatory Visit (HOSPITAL_BASED_OUTPATIENT_CLINIC_OR_DEPARTMENT_OTHER): Payer: BC Managed Care – PPO

## 2023-05-22 DIAGNOSIS — M6281 Muscle weakness (generalized): Secondary | ICD-10-CM | POA: Diagnosis not present

## 2023-05-22 DIAGNOSIS — R2689 Other abnormalities of gait and mobility: Secondary | ICD-10-CM

## 2023-05-22 DIAGNOSIS — M25562 Pain in left knee: Secondary | ICD-10-CM

## 2023-05-22 DIAGNOSIS — R600 Localized edema: Secondary | ICD-10-CM

## 2023-05-22 DIAGNOSIS — M25662 Stiffness of left knee, not elsewhere classified: Secondary | ICD-10-CM

## 2023-05-22 NOTE — Therapy (Signed)
OUTPATIENT PHYSICAL THERAPY LOWER EXTREMITY TREATMENT   Patient Name: Diana Herman MRN: [id number] DOB:1966/04/18, 57 y.o., female Today's Date: 05/22/2023  END OF SESSION:  PT End of Session - 05/22/23 1022     Visit Number 7    Number of Visits 16    Date for PT Re-Evaluation 06/24/23    Authorization Type [employer]    PT Start Time 1017    PT Stop Time 1100    PT Time Calculation (min) 43 min    Activity Tolerance Patient tolerated treatment well    Behavior During Therapy WFL for tasks assessed/performed               Past Medical History:  Diagnosis Date   Achilles rupture, right 08/18/2018   Formatting of this note might be different from the original. 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   Severe sepsis with acute organ dysfunction (HCC) 10/15/2021   Sleep apnea    wears cpap    Past Surgical History:  Procedure Laterality Date   ACHILLES TENDON SURGERY Right    COLONOSCOPY  04/14/2021   [facility]   COLONOSCOPY WITH ESOPHAGOGASTRODUODENOSCOPY (EGD)  01/2022   HYSTERECTOMY     hysteroscopy and polypectomy     POLYPECTOMY     TONSILLECTOMY     WISDOM TOOTH EXTRACTION     early 90's    Patient Active Problem List   Diagnosis Date Noted   Depression with anxiety 10/15/2021   Nausea vomiting and diarrhea 08/13/2021   Type 2 diabetes mellitus without complication (HCC)  08/13/2021   Hx of adenomatous colonic polyps 04/28/2021   Long term current use of systemic steroids 09/21/2020   Primary hypertension 08/30/2020   Morbid obesity (HCC) 01/01/2019   Polymyalgia rheumatica (HCC) 08/27/2017   Primary osteoarthritis of left knee 06/30/2016   Primary osteoarthritis of right knee 06/30/2016   Anxiety 01/17/2016   Impaired glucose tolerance 01/17/2016   Mixed hyperlipidemia 01/17/2016   OSA on CPAP 01/17/2016    PCP: Pricilla Holm   REFERRING PROVIDER: Darnell Level MD   REFERRING DIAG: Left TKA   THERAPY DIAG:  Muscle weakness (generalized)  Other abnormalities of gait and mobility  Acute pain of left knee  Stiffness of left knee, not elsewhere classified  Localized edema  Rationale for Evaluation and Treatment: Rehabilitation  ONSET DATE: 04/23/2023  SUBJECTIVE:   SUBJECTIVE STATEMENT: Patient reports knee is doing well. She does have twitching and shaking now in knee at night time which can keep her awake.  PERTINENT HISTORY: Right achilles repair, polymyalgia rheumatica; OA of both knees, sleep apneas. Depression, anxiety PAIN:  Are you having pain? Yes: NPRS scale: 2/10 after manual therapy a 4/10  Pain location: right knee  Pain description: aching  Aggravating factors: standing and walking  Relieving factors: rest   PRECAUTIONS: Knee  WEIGHT BEARING RESTRICTIONS: Yes WBAT   FALLS:  Has patient fallen in last 6 months? 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   Hobbies:  Play with her dog; walk her dog  PLOF: Independent  PATIENT GOALS:   To be able to do normal activity without increased pain   NEXT MD VISIT:  March 1st    OBJECTIVE:   DIAGNOSTIC FINDINGS: nothing post op  PATIENT SURVEYS:  FOTO    COGNITION: Overall cognitive status: Within functional limits for tasks assessed     SENSATION: Denies paresthesias   EDEMA:   Nothing significant  MUSCLE LENGTH:  POSTURE: No Significant postural limitations  PALPATION: No unexpected TTP   LOWER EXTREMITY ROM:  Passive ROM Right eval Left eval Left  Left 6/20   Hip flexion      Hip extension      Hip abduction      Hip adduction      Hip internal rotation      Hip external rotation      Knee flexion  85 90 106  Knee extension  -10 -5 -5  Ankle dorsiflexion      Ankle plantarflexion      Ankle inversion      Ankle eversion       (Blank rows = not tested)  LOWER EXTREMITY MMT:  MMT Right eval Left eval  Hip flexion    Hip extension    Hip abduction    Hip adduction    Hip internal rotation    Hip external rotation    Knee flexion    Knee extension    Ankle dorsiflexion    Ankle plantarflexion    Ankle inversion    Ankle eversion     (Blank rows = not tested) not tested secondary recent surgery   FUNCTIONAL TESTS:  Bed mobility: Uses arms to lift right leg up onto the bed. Sit to stand transfer: Uses arms to push up. GAIT: Using a rolling walker. Decreased weight bearing on the left. Using UE to take weight off.   TODAY'S TREATMENT:                                                                                                                              DATE:   7/3  Sci fit biks partial and full revs  Long sit HSS- 30sec x3L SLR 3x10  Bridge 2x10 staggered-L behind  Manual: PROM into flexion/extension.  LAQ in pain free range 5" hold 2x10, 4# Seated HSC EOB x20 RTB  Heel raise 2x10 Standing march 2x10 Partial squats 2x10 Staggered Sit to stands x10ea from low plinth Gait fwd and backward 49ft ea Walking marching 42ft x2  Step ups 4" 2x10 L  7/1  Sci fit biks 5 min  for ROM  Long sit HSS- 30sec x3L SLR 3x10  Bridge 2x10 staggered-L behind  Manual: PROM into flexion/extension. Tib/fem mobilizations with posterior glide grade II-III  LAQ in pain free range 5" hold 2x10, 3#  Heel raise  2x10 Standing march 2x10 Partial squats 2x10 Sit to stands x10 from low plinth Standing HSC 2x10 Gait- 349ft no AD STM to medial HS   6/28  Nu-step 6 min L4 cuing for ROM   Quad set x20  SLR 3x10  Bridge 2x10   Manual: PROM into flexion and emphasis on extension; Trigger point release to medial posterior knee   LAQ in pain free range 5" hold 2x10  Heel raise 2x10 Standing march 2x10 Partial squats 2x10  6/25 PROM into flexion and extension Scifit bike, partial and full revs x103min Bridge 2x10 SAQ- 1x10 (some medial knee pain) SLR 2x10 Heel prop  LAQ in pain free range 5" hold 2x10 Hamstring curl seated EOB-RTB 2x10  Heel raise 2x10 Standing march 2x10 Partial squats 2x10  PATIENT EDUCATION:  Education details: HEP, symptom management;  Person educated: Patient Education method: Explanation, Demonstration, Tactile cues, Verbal cues, and Handouts Education comprehension: verbalized understanding, returned demonstration, verbal cues required, tactile cues required, and needs further education  HOME EXERCISE PROGRAM: Access Code: [facility] URL: [email or web address] Date: 01/11/2023 Prepared by: Lorayne Bender   ASSESSMENT:  CLINICAL IMPRESSION: Continues with mild ROM deficits in end range knee flexion and extension. Improved with tolerance for recumbent bike, though full revs are still challenging. Antalgic gait worsens throughout session as pt fatigues. Good tolerance for step ups, though some weakness present. Pt will benefit from continued PT to address continued deficits.    OBJECTIVE IMPAIRMENTS: Abnormal gait, decreased activity tolerance, decreased knowledge of use of DME, decreased mobility, difficulty walking, decreased ROM, decreased strength, and pain.   ACTIVITY LIMITATIONS: carrying, lifting, bending, sitting, standing, squatting, sleeping, stairs, transfers, bed mobility, bathing, dressing, and locomotion level  PARTICIPATION  LIMITATIONS: meal prep, cleaning, laundry, driving, shopping, community activity, occupation, and yard work  PERSONAL FACTORS: 1-2 comorbidities: polymyalgia rheumatica, anxiety, and depression   are also affecting patient's functional outcome.   REHAB POTENTIAL: Good  CLINICAL DECISION MAKING: Stable/uncomplicated  EVALUATION COMPLEXITY: Low   GOALS: Goals reviewed with patient? Yes  SHORT TERM GOALS: Target date: 02/08/2023   Patient will increase left knee flexion to 110 degrees Baseline: Goal status: INITIAL  2.  Patient will demonstrate full left knee  extension Baseline:  Goal status: INITIAL  3.  Patient will ambulate 300 feet with single-point cane showing good safety and weightbearing on the right side Baseline:  Goal status: INITIAL  4.  Patient will be independent with initial exercise program Baseline:  Goal status: INITIAL  LONG TERM GOALS: Target date: 03/08/2023    Patient will go up and down 8 steps with reciprocal gait in order to ambulate in the community Baseline:  Goal status: INITIAL  2.  Patient will ambulate 3000 feet with least restrictive assistive device in order to go shopping and walk her dog Baseline:  Goal status: INITIAL  3.  Patient will be independent with complete exercise program Baseline:  Goal status: INITIAL  4.  Patient will stand for greater than 1 hour without increased right knee pain in order to perform ADLs and IADLs Baseline:  Goal status: INITIAL   PLAN:  PT FREQUENCY: 2x/week  PT DURATION: 8 weeks  PLANNED INTERVENTIONS: Therapeutic exercises, Therapeutic activity, Neuromuscular re-education, Balance training, Gait training, Patient/Family education, Self  Care, Joint mobilization, Stair training, Aquatic Therapy, Dry Needling, Cryotherapy, Moist heat, Ultrasound, and Manual therapy  PLAN FOR NEXT SESSION: begin PROM into flexion and extension. Progress to SLR as tolerated and SAQ; progress standing exercises as  tolerated.   Riki Altes, PTA  05/22/2023, 12:04 PM

## 2023-05-28 ENCOUNTER — Encounter (HOSPITAL_BASED_OUTPATIENT_CLINIC_OR_DEPARTMENT_OTHER): Payer: BC Managed Care – PPO

## 2023-05-31 ENCOUNTER — Ambulatory Visit (HOSPITAL_BASED_OUTPATIENT_CLINIC_OR_DEPARTMENT_OTHER): Payer: BC Managed Care – PPO | Admitting: Physical Therapy

## 2023-05-31 ENCOUNTER — Encounter (HOSPITAL_BASED_OUTPATIENT_CLINIC_OR_DEPARTMENT_OTHER): Payer: Self-pay | Admitting: Physical Therapy

## 2023-05-31 DIAGNOSIS — M6281 Muscle weakness (generalized): Secondary | ICD-10-CM

## 2023-05-31 DIAGNOSIS — M25562 Pain in left knee: Secondary | ICD-10-CM

## 2023-05-31 DIAGNOSIS — M25662 Stiffness of left knee, not elsewhere classified: Secondary | ICD-10-CM

## 2023-05-31 DIAGNOSIS — R2689 Other abnormalities of gait and mobility: Secondary | ICD-10-CM

## 2023-05-31 NOTE — Therapy (Signed)
OUTPATIENT PHYSICAL THERAPY LOWER EXTREMITY TREATMENT   Patient Name: Diana Herman MRN: [id number] DOB:02-26-1966, 57 y.o., female Today's Date: 05/31/2023  END OF SESSION:  PT End of Session - 05/31/23 1422     Visit Number 8    Number of Visits 16    Date for PT Re-Evaluation 06/24/23    Authorization Type [employer]    PT Start Time 1100    PT Stop Time 1142    PT Time Calculation (min) 42 min    Activity Tolerance Patient tolerated treatment well    Behavior During Therapy WFL for tasks assessed/performed                Past Medical History:  Diagnosis Date   Achilles rupture, right 08/18/2018   Formatting of this note might be different from the original. 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   Severe sepsis with acute organ dysfunction (HCC) 10/15/2021   Sleep apnea    wears cpap    Past Surgical History:  Procedure Laterality Date   ACHILLES TENDON SURGERY Right    COLONOSCOPY  04/14/2021   [facility]   COLONOSCOPY WITH ESOPHAGOGASTRODUODENOSCOPY (EGD)  01/2022   HYSTERECTOMY     hysteroscopy and polypectomy     POLYPECTOMY     TONSILLECTOMY     WISDOM TOOTH EXTRACTION     early 90's    Patient Active Problem List   Diagnosis Date Noted   Depression with anxiety 10/15/2021   Nausea vomiting and diarrhea 08/13/2021   Type 2 diabetes mellitus without complication (HCC)  08/13/2021   Hx of adenomatous colonic polyps 04/28/2021   Long term current use of systemic steroids 09/21/2020   Primary hypertension 08/30/2020   Morbid obesity (HCC) 01/01/2019   Polymyalgia rheumatica (HCC) 08/27/2017   Primary osteoarthritis of left knee 06/30/2016   Primary osteoarthritis of right knee 06/30/2016   Anxiety 01/17/2016   Impaired glucose tolerance 01/17/2016   Mixed hyperlipidemia 01/17/2016   OSA on CPAP 01/17/2016    PCP: Pricilla Holm   REFERRING PROVIDER: Darnell Level MD   REFERRING DIAG: Left TKA   THERAPY DIAG:  Muscle weakness (generalized)  Other abnormalities of gait and mobility  Acute pain of left knee  Stiffness of left knee, not elsewhere classified  Rationale for Evaluation and Treatment: Rehabilitation  ONSET DATE: 04/23/2023  SUBJECTIVE:   SUBJECTIVE STATEMENT: The patient has had a stressful week but her knee has held up.  PERTINENT HISTORY: Right achilles repair, polymyalgia rheumatica; OA of both knees, sleep apneas. Depression, anxiety PAIN:  Are you having pain? Yes: NPRS scale: 2/10  Pain location: right knee  Pain description: aching  Aggravating factors: standing and walking  Relieving factors: rest   PRECAUTIONS: Knee  WEIGHT BEARING  RESTRICTIONS: Yes WBAT   FALLS:  Has patient fallen in last 6 months? 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   Hobbies:  Play with her dog; walk her dog  PLOF: Independent  PATIENT GOALS:   To be able to do normal activity without increased pain   NEXT MD VISIT:  March 1st    OBJECTIVE:   DIAGNOSTIC FINDINGS: nothing post op  PATIENT SURVEYS:  FOTO    COGNITION: Overall cognitive status: Within functional limits for tasks assessed     SENSATION: Denies paresthesias   EDEMA:  Nothing significant  MUSCLE LENGTH:  POSTURE: No Significant postural limitations  PALPATION: No  unexpected TTP   LOWER EXTREMITY ROM:  Passive ROM Right eval Left eval Left  Left 6/20    Hip flexion       Hip extension       Hip abduction       Hip adduction       Hip internal rotation       Hip external rotation       Knee flexion  85 90 106 111  Knee extension  -10 -5 -5 -6  Ankle dorsiflexion       Ankle plantarflexion       Ankle inversion       Ankle eversion        (Blank rows = not tested)  LOWER EXTREMITY MMT:  MMT Right eval Left eval  Hip flexion    Hip extension    Hip abduction    Hip adduction    Hip internal rotation    Hip external rotation    Knee flexion    Knee extension    Ankle dorsiflexion    Ankle plantarflexion    Ankle inversion    Ankle eversion     (Blank rows = not tested) not tested secondary recent surgery   FUNCTIONAL TESTS:  Bed mobility: Uses arms to lift right leg up onto the bed. Sit to stand transfer: Uses arms to push up. GAIT: Using a rolling walker. Decreased weight bearing on the left. Using UE to take weight off.   TODAY'S TREATMENT:                                                                                                                              DATE:  7/12 Sci fit biks partial and full revs SAQ 2lb 3x15  SLR 3x10   Manual: PROM into flexion/extension. Grade II and III PA and AP mobilization to improve extension.   Heel raise 2x10 Standing march 2x10 Partial squats 2x10  Step ups 4" 2x10 L Lateral step up 4" 2x10      7/3  Sci fit biks partial and full revs  Long sit HSS- 30sec x3L SLR 3x10  Bridge 2x10 staggered-L behind  Manual: PROM into flexion/extension.  LAQ  in pain free range 5" hold 2x10, 4# Seated HSC EOB x20 RTB  Heel raise 2x10 Standing march 2x10 Partial squats 2x10 Staggered Sit to stands x10ea from low plinth Gait fwd and backward 39ft ea Walking marching 64ft x2  Step ups 4" 2x10 L  7/1  Sci fit biks 5 min for ROM  Long sit HSS- 30sec x3L SLR  3x10  Bridge 2x10 staggered-L behind  Manual: PROM into flexion/extension. Tib/fem mobilizations with posterior glide grade II-III  LAQ in pain free range 5" hold 2x10, 3#  Heel raise 2x10 Standing march 2x10 Partial squats 2x10 Sit to stands x10 from low plinth Standing HSC 2x10 Gait- 381ft no AD STM to medial HS   6/28  Nu-step 6 min L4 cuing for ROM   Quad set x20  SLR 3x10  Bridge 2x10   Manual: PROM into flexion and emphasis on extension; Trigger point release to medial posterior knee   LAQ in pain free range 5" hold 2x10  Heel raise 2x10 Standing march 2x10 Partial squats 2x10  6/25 PROM into flexion and extension Scifit bike, partial and full revs x66min Bridge 2x10 SAQ- 1x10 (some medial knee pain) SLR 2x10 Heel prop  LAQ in pain free range 5" hold 2x10 Hamstring curl seated EOB-RTB 2x10  Heel raise 2x10 Standing march 2x10 Partial squats 2x10  PATIENT EDUCATION:  Education details: HEP, symptom management;  Person educated: Patient Education method: Explanation, Demonstration, Tactile cues, Verbal cues, and Handouts Education comprehension: verbalized understanding, returned demonstration, verbal cues required, tactile cues required, and needs further education  HOME EXERCISE PROGRAM: Access Code: [facility] URL: [email or web address] Date: 01/11/2023 Prepared by: Lorayne Bender   ASSESSMENT:  CLINICAL IMPRESSION: The patient is making good progress. Her flexion range is improving. She still needs to work on her extension range. She tolerated steps well. She felt comfortable with 4 inch. We did not go with a 6 today or do a step down. We will continue to progress as tolerated.    OBJECTIVE IMPAIRMENTS: Abnormal gait, decreased activity tolerance, decreased knowledge of use of DME, decreased mobility, difficulty walking, decreased ROM, decreased strength, and pain.   ACTIVITY LIMITATIONS: carrying, lifting, bending, sitting,  standing, squatting, sleeping, stairs, transfers, bed mobility, bathing, dressing, and locomotion level  PARTICIPATION LIMITATIONS: meal prep, cleaning, laundry, driving, shopping, community activity, occupation, and yard work  PERSONAL FACTORS: 1-2 comorbidities: polymyalgia rheumatica, anxiety, and depression   are also affecting patient's functional outcome.   REHAB POTENTIAL: Good  CLINICAL DECISION MAKING: Stable/uncomplicated  EVALUATION COMPLEXITY: Low   GOALS: Goals reviewed with patient? Yes  SHORT TERM GOALS: Target date: 02/08/2023   Patient will increase left knee flexion to 110 degrees Baseline: Goal status: INITIAL  2.  Patient will demonstrate full left knee  extension Baseline:  Goal status: INITIAL  3.  Patient will ambulate 300 feet with single-point cane showing good safety and weightbearing on the right side Baseline:  Goal status: INITIAL  4.  Patient will be independent with initial exercise program Baseline:  Goal status: INITIAL  LONG TERM GOALS: Target date: 03/08/2023    Patient will go up and down 8 steps with reciprocal gait in order to ambulate in the community Baseline:  Goal status: INITIAL  2.  Patient will ambulate 3000 feet with least restrictive assistive device in order to go shopping and walk her dog Baseline:  Goal status: INITIAL  3.  Patient will be independent with complete exercise program Baseline:  Goal status: INITIAL  4.  Patient will stand for greater than 1 hour without increased right knee pain in order to perform ADLs and IADLs Baseline:  Goal status: INITIAL   PLAN:  PT FREQUENCY: 2x/week  PT DURATION: 8 weeks  PLANNED INTERVENTIONS: Therapeutic exercises, Therapeutic activity, Neuromuscular re-education, Balance training, Gait training, Patient/Family education, Self Care, Joint mobilization, Stair training, Aquatic Therapy, Dry Needling, Cryotherapy, Moist heat, Ultrasound, and Manual therapy  PLAN FOR  NEXT SESSION: begin PROM into flexion and extension. Progress to SLR as tolerated and SAQ; progress standing exercises as tolerated.   Riki Altes, PTA  05/31/2023, 2:27 PM

## 2023-06-04 ENCOUNTER — Encounter (HOSPITAL_BASED_OUTPATIENT_CLINIC_OR_DEPARTMENT_OTHER): Payer: Self-pay

## 2023-06-04 ENCOUNTER — Ambulatory Visit (HOSPITAL_BASED_OUTPATIENT_CLINIC_OR_DEPARTMENT_OTHER): Payer: BC Managed Care – PPO

## 2023-06-07 ENCOUNTER — Encounter (HOSPITAL_BASED_OUTPATIENT_CLINIC_OR_DEPARTMENT_OTHER): Payer: BC Managed Care – PPO | Admitting: Physical Therapy

## 2023-06-11 ENCOUNTER — Ambulatory Visit (HOSPITAL_BASED_OUTPATIENT_CLINIC_OR_DEPARTMENT_OTHER): Payer: BC Managed Care – PPO

## 2023-06-11 ENCOUNTER — Encounter (HOSPITAL_BASED_OUTPATIENT_CLINIC_OR_DEPARTMENT_OTHER): Payer: Self-pay

## 2023-06-11 DIAGNOSIS — R600 Localized edema: Secondary | ICD-10-CM

## 2023-06-11 DIAGNOSIS — M25562 Pain in left knee: Secondary | ICD-10-CM

## 2023-06-11 DIAGNOSIS — M6281 Muscle weakness (generalized): Secondary | ICD-10-CM

## 2023-06-11 DIAGNOSIS — R2689 Other abnormalities of gait and mobility: Secondary | ICD-10-CM

## 2023-06-11 DIAGNOSIS — M25662 Stiffness of left knee, not elsewhere classified: Secondary | ICD-10-CM

## 2023-06-11 NOTE — Therapy (Signed)
OUTPATIENT PHYSICAL THERAPY LOWER EXTREMITY TREATMENT   Patient Name: Diana Herman MRN: [id number] DOB:1966-05-23, 57 y.o., female Today's Date: 06/11/2023  END OF SESSION:  PT End of Session - 06/11/23 1113     Visit Number 9    Number of Visits 16    Date for PT Re-Evaluation 06/24/23    Authorization Type BCBS    PT Start Time 1100    PT Stop Time 1140    PT Time Calculation (min) 40 min    Activity Tolerance Patient tolerated treatment well    Behavior During Therapy WFL for tasks assessed/performed                 Past Medical History:  Diagnosis Date   Achilles rupture, right 08/18/2018   Formatting of this note might be different from the original. 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   Severe sepsis with acute organ dysfunction (HCC) 10/15/2021   Sleep apnea    wears cpap    Past Surgical History:  Procedure Laterality Date   ACHILLES TENDON SURGERY Right    COLONOSCOPY  04/14/2021   [facility]   COLONOSCOPY WITH ESOPHAGOGASTRODUODENOSCOPY (EGD)  01/2022   HYSTERECTOMY     hysteroscopy and polypectomy     POLYPECTOMY     TONSILLECTOMY     WISDOM TOOTH EXTRACTION     early 90's    Patient Active Problem List   Diagnosis Date Noted   Depression with anxiety 10/15/2021   Nausea vomiting and diarrhea 08/13/2021   Type 2 diabetes mellitus without complication  (HCC) 08/13/2021   Hx of adenomatous colonic polyps 04/28/2021   Long term current use of systemic steroids 09/21/2020   Primary hypertension 08/30/2020   Morbid obesity (HCC) 01/01/2019   Polymyalgia rheumatica (HCC) 08/27/2017   Primary osteoarthritis of left knee 06/30/2016   Primary osteoarthritis of right knee 06/30/2016   Anxiety 01/17/2016   Impaired glucose tolerance 01/17/2016   Mixed hyperlipidemia 01/17/2016   OSA on CPAP 01/17/2016    PCP: Pricilla Holm   REFERRING PROVIDER: Darnell Level MD   REFERRING DIAG: Left TKA   THERAPY DIAG:  Muscle weakness (generalized)  Other abnormalities of gait and mobility  Stiffness of left knee, not elsewhere classified  Acute pain of left knee  Localized edema  Rationale for Evaluation and Treatment: Rehabilitation  ONSET DATE: 04/23/2023  SUBJECTIVE:   SUBJECTIVE STATEMENT: Pt reports she was able to grocery shop at [employer] for the first time in years. Was also able to carry and put all groceries away without significant pain or difficulty.  PERTINENT HISTORY: Right achilles repair, polymyalgia rheumatica; OA of both knees, sleep apneas. Depression, anxiety PAIN:  Are you having pain? Yes: NPRS scale: 2/10  Pain location:  right knee  Pain description: aching  Aggravating factors: standing and walking  Relieving factors: rest   PRECAUTIONS: Knee  WEIGHT BEARING RESTRICTIONS: Yes WBAT   FALLS:  Has patient fallen in last 6 months? 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   Hobbies:  Play with her dog; walk her dog  PLOF: Independent  PATIENT GOALS:   To be able to do normal activity without increased pain   NEXT MD VISIT:  March 1st    OBJECTIVE:   DIAGNOSTIC FINDINGS: nothing post op  PATIENT SURVEYS:  FOTO    COGNITION: Overall cognitive status: Within functional limits for tasks assessed     SENSATION: Denies  paresthesias   EDEMA:  Nothing significant  MUSCLE LENGTH:  POSTURE: No Significant postural limitations  PALPATION: No unexpected TTP   LOWER EXTREMITY ROM:  Passive ROM Right eval Left eval Left  Left 6/20    Hip flexion       Hip extension       Hip abduction       Hip adduction       Hip internal rotation       Hip external rotation       Knee flexion  85 90 106 111  Knee extension  -10 -5 -5 -6  Ankle dorsiflexion       Ankle plantarflexion       Ankle inversion       Ankle eversion        (Blank rows = not tested)  LOWER EXTREMITY MMT:  MMT Right eval Left eval  Hip flexion    Hip extension    Hip abduction    Hip adduction    Hip internal rotation    Hip external rotation    Knee flexion    Knee extension    Ankle dorsiflexion    Ankle plantarflexion    Ankle inversion    Ankle eversion     (Blank rows = not tested) not tested secondary recent surgery   FUNCTIONAL TESTS:  Bed mobility: Uses arms to lift right leg up onto the bed. Sit to stand transfer: Uses arms to push up. GAIT: Using a rolling walker. Decreased weight bearing on the left. Using UE to take weight off.   TODAY'S TREATMENT:                                                                                                                              DATE:   7/23  Sci fit biks partial and full revs  Long sit HSS- 30sec x3L SLR 3x10  Bridge 2x15 staggered-L behind  Manual: PROM into flexion/extension. Overpressure with extension  LAQ in pain free range 5" hold 2x10, 5#   Standing march 2x10 Partial squats 2x10 Staggered Sit to stands 2x10 from low plinth L behind Gait fwd and backward 68ft ea  Walking marching 27ft x2  Step ups 4" 2x10 L Postioer slide lunge with pillowcase under R foot   7/12 Sci fit biks partial and full revs SAQ 2lb 3x15  SLR 3x10   Manual: PROM into flexion/extension. Grade I-II mobilizations  Heel raise 2x10 Standing march  2x10 Partial squats 2x10  Step ups 4" 2x10 L Lateral step up 4" 2x10      7/3  Sci fit biks partial and full revs  Long sit HSS- 30sec x3L SLR 3x10  Bridge 2x10 staggered-L behind  Manual: PROM into flexion/extension.  LAQ in pain free range 5" hold 2x10, 4# Seated HSC EOB x20 RTB  Heel raise 2x10 Standing march 2x10 Partial squats 2x10 Staggered Sit to stands x10ea from low plinth Gait fwd and backward 26ft ea Walking marching 20ft x2  Step ups 4" 2x10 L  7/1  Sci fit biks 5 min for ROM  Long sit HSS- 30sec x3L SLR 3x10  Bridge 2x10 staggered-L behind  Manual: PROM into flexion/extension. Tib/fem mobilizations with posterior glide grade II-III  LAQ in pain free range 5" hold 2x10, 3#  Heel raise 2x10 Standing march 2x10 Partial squats 2x10 Sit to stands x10 from low plinth Standing HSC 2x10 Gait- 322ft no AD STM to medial HS   6/28  Nu-step 6 min L4 cuing for ROM   Quad set x20  SLR 3x10  Bridge 2x10   Manual: PROM into flexion and emphasis on extension; Trigger point release to medial posterior knee   LAQ in pain free range 5" hold 2x10  Heel raise 2x10 Standing march 2x10 Partial squats 2x10  6/25 PROM into flexion and extension Scifit bike, partial and full revs x28min Bridge 2x10 SAQ- 1x10 (some medial knee pain) SLR 2x10 Heel prop  LAQ in pain free range 5" hold 2x10 Hamstring curl seated EOB-RTB 2x10  Heel raise 2x10 Standing march 2x10 Partial squats 2x10  PATIENT EDUCATION:  Education details: HEP, symptom management;  Person educated: Patient Education method: Explanation, Demonstration, Tactile cues, Verbal cues, and Handouts Education comprehension: verbalized understanding, returned demonstration, verbal cues required, tactile cues required, and needs further education  HOME EXERCISE PROGRAM: Access Code: [facility] URL: [email or web address] Date: 01/11/2023 Prepared by: Lorayne Bender   ASSESSMENT:  CLINICAL IMPRESSION: Pt demonstrates improved ability with recumbent bike today, citing smoother and easier revolutions. Improved balance observed with walking marches. Able to increase resistance with LAQ without complaint. Trialled more advanced loading exercises today including posterior slide lunges and eccentric fwd slides. Pt able to complete step ups at 6" step and eccentric heel touches from 2" step. Will practice stair case in clinic  next session.   OBJECTIVE IMPAIRMENTS: Abnormal gait, decreased activity tolerance, decreased knowledge of use of DME, decreased mobility, difficulty walking, decreased ROM, decreased strength, and pain.   ACTIVITY LIMITATIONS: carrying, lifting, bending, sitting, standing, squatting, sleeping, stairs, transfers, bed mobility, bathing, dressing, and locomotion level  PARTICIPATION LIMITATIONS: meal prep, cleaning, laundry, driving, shopping, community activity, occupation, and yard work  PERSONAL FACTORS: 1-2 comorbidities: polymyalgia rheumatica, anxiety, and depression   are also affecting patient's functional outcome.   REHAB POTENTIAL: Good  CLINICAL DECISION MAKING: Stable/uncomplicated  EVALUATION COMPLEXITY: Low   GOALS: Goals reviewed with patient? Yes  SHORT TERM GOALS: Target date: 02/08/2023   Patient will increase left knee flexion to 110 degrees Baseline: Goal status: INITIAL  2.  Patient will demonstrate full left knee  extension Baseline:  Goal status: INITIAL  3.  Patient  will ambulate 300 feet with single-point cane showing good safety and weightbearing on the right side Baseline:  Goal status: INITIAL  4.  Patient will be independent with initial exercise program Baseline:  Goal status: INITIAL  LONG TERM GOALS: Target date: 03/08/2023    Patient will go up and down 8 steps with reciprocal gait in order to ambulate in the community Baseline:  Goal status: INITIAL  2.  Patient will  ambulate 3000 feet with least restrictive assistive device in order to go shopping and walk her dog Baseline:  Goal status: INITIAL  3.  Patient will be independent with complete exercise program Baseline:  Goal status: INITIAL  4.  Patient will stand for greater than 1 hour without increased right knee pain in order to perform ADLs and IADLs Baseline:  Goal status: INITIAL   PLAN:  PT FREQUENCY: 2x/week  PT DURATION: 8 weeks  PLANNED INTERVENTIONS: Therapeutic exercises, Therapeutic activity, Neuromuscular re-education, Balance training, Gait training, Patient/Family education, Self Care, Joint mobilization, Stair training, Aquatic Therapy, Dry Needling, Cryotherapy, Moist heat, Ultrasound, and Manual therapy  PLAN FOR NEXT SESSION: begin PROM into flexion and extension. Progress to SLR as tolerated and SAQ; progress standing exercises as tolerated.   Riki Altes, PTA  06/11/2023, 12:07 PM

## 2023-06-14 ENCOUNTER — Encounter (HOSPITAL_BASED_OUTPATIENT_CLINIC_OR_DEPARTMENT_OTHER): Payer: Self-pay

## 2023-06-14 ENCOUNTER — Ambulatory Visit (HOSPITAL_BASED_OUTPATIENT_CLINIC_OR_DEPARTMENT_OTHER): Payer: BC Managed Care – PPO

## 2023-06-14 DIAGNOSIS — M6281 Muscle weakness (generalized): Secondary | ICD-10-CM

## 2023-06-14 DIAGNOSIS — M25662 Stiffness of left knee, not elsewhere classified: Secondary | ICD-10-CM

## 2023-06-14 DIAGNOSIS — R600 Localized edema: Secondary | ICD-10-CM

## 2023-06-14 DIAGNOSIS — M25562 Pain in left knee: Secondary | ICD-10-CM

## 2023-06-14 DIAGNOSIS — R2689 Other abnormalities of gait and mobility: Secondary | ICD-10-CM

## 2023-06-14 NOTE — Therapy (Signed)
OUTPATIENT PHYSICAL THERAPY LOWER EXTREMITY TREATMENT   Patient Name: Diana Herman MRN: [id number] DOB:Apr 02, 1966, 57 y.o., female Today's Date: 06/14/2023  END OF SESSION:  PT End of Session - 06/14/23 1132     Visit Number 10    Number of Visits 16    Date for PT Re-Evaluation 06/24/23    Authorization Type BCBS    PT Start Time 1015    PT Stop Time 1058    PT Time Calculation (min) 43 min    Activity Tolerance Patient tolerated treatment well    Behavior During Therapy WFL for tasks assessed/performed                  Past Medical History:  Diagnosis Date   Achilles rupture, right 08/18/2018   Formatting of this note might be different from the original. 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   Severe sepsis with acute organ dysfunction (HCC) 10/15/2021   Sleep apnea    wears cpap    Past Surgical History:  Procedure Laterality Date   ACHILLES TENDON SURGERY Right    COLONOSCOPY  04/14/2021   [facility]   COLONOSCOPY WITH ESOPHAGOGASTRODUODENOSCOPY (EGD)  01/2022   HYSTERECTOMY     hysteroscopy and polypectomy     POLYPECTOMY     TONSILLECTOMY     WISDOM TOOTH EXTRACTION     early 90's    Patient Active Problem List   Diagnosis Date Noted   Depression with anxiety 10/15/2021   Nausea vomiting and diarrhea 08/13/2021   Type 2 diabetes mellitus without complication  (HCC) 08/13/2021   Hx of adenomatous colonic polyps 04/28/2021   Long term current use of systemic steroids 09/21/2020   Primary hypertension 08/30/2020   Morbid obesity (HCC) 01/01/2019   Polymyalgia rheumatica (HCC) 08/27/2017   Primary osteoarthritis of left knee 06/30/2016   Primary osteoarthritis of right knee 06/30/2016   Anxiety 01/17/2016   Impaired glucose tolerance 01/17/2016   Mixed hyperlipidemia 01/17/2016   OSA on CPAP 01/17/2016    PCP: Pricilla Holm   REFERRING PROVIDER: Darnell Level MD   REFERRING DIAG: Left TKA   THERAPY DIAG:  Localized edema  Muscle weakness (generalized)  Other abnormalities of gait and mobility  Stiffness of left knee, not elsewhere classified  Acute pain of left knee  Rationale for Evaluation and Treatment: Rehabilitation  ONSET DATE: 04/23/2023  SUBJECTIVE:   SUBJECTIVE STATEMENT: Pt reports no pain at entry. Taking prednisone dose pack for gout.   PERTINENT HISTORY: Right achilles repair, polymyalgia rheumatica; OA of both knees, sleep apneas. Depression, anxiety PAIN:  Are you having pain? No  PRECAUTIONS: Knee  WEIGHT BEARING RESTRICTIONS: Yes WBAT   FALLS:  Has patient fallen in last 6 months? 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   Hobbies:  Play with her dog; walk her dog  PLOF: Independent  PATIENT GOALS:   To be able to do normal activity without increased pain   NEXT MD VISIT:  March 1st    OBJECTIVE:   DIAGNOSTIC FINDINGS: nothing post op  PATIENT SURVEYS:  FOTO    COGNITION: Overall cognitive status: Within functional limits for tasks assessed     SENSATION: Denies paresthesias   EDEMA:  Nothing significant  MUSCLE LENGTH:  POSTURE: No Significant postural limitations  PALPATION: No unexpected TTP   LOWER EXTREMITY ROM:  Passive ROM Right eval Left eval Left  Left 6/20    Hip flexion       Hip  extension       Hip abduction       Hip adduction       Hip internal rotation       Hip external rotation       Knee flexion  85 90 106 111  Knee extension  -10 -5 -5 -6  Ankle dorsiflexion       Ankle plantarflexion       Ankle inversion       Ankle eversion        (Blank rows = not tested)  LOWER EXTREMITY MMT:  MMT Right eval Left eval  Hip flexion    Hip extension    Hip abduction    Hip adduction    Hip internal rotation    Hip external rotation    Knee flexion    Knee extension    Ankle dorsiflexion    Ankle plantarflexion    Ankle inversion    Ankle eversion     (Blank rows = not tested) not tested secondary recent surgery   FUNCTIONAL TESTS:  Bed mobility: Uses arms to lift right leg up onto the bed. Sit to stand transfer: Uses arms to push up. GAIT: Using a rolling walker. Decreased weight bearing on the left. Using UE to take weight off.   TODAY'S TREATMENT:                                                                                                                              DATE:   7/26  Sci fit biks full revs L 3  Stair climbing- 1/2 flight reciprocal, full flight reciprocal 1 rail Eccentric heel taps-bottom stair x10ea  Manual: IASTM to distal R quads PROM bilateral knee flexion  Walking marching 54ft x2 Side stepping with GTB at ankles  Lunges (static) x10ea at rail Walk 1 lap around track upstairs   7/23  Sci fit biks partial and full revs  Long sit HSS- 30sec x3L SLR 3x10  Bridge 2x15 staggered-L behind  Manual: PROM into flexion/extension. Overpressure with extension  LAQ in pain free range 5" hold 2x10, 5#   Standing  march 2x10 Partial squats 2x10 Staggered Sit to stands 2x10 from low plinth L behind Gait fwd and backward 16ft ea Walking marching 28ft x2  Step ups 4" 2x10 L Postioer slide lunge with pillowcase under R foot   PATIENT EDUCATION:  Education details: HEP, symptom management;  Person  educated: Patient Education method: Explanation, Demonstration, Tactile cues, Verbal cues, and Handouts Education comprehension: verbalized understanding, returned demonstration, verbal cues required, tactile cues required, and needs further education  HOME EXERCISE PROGRAM: Access Code: [facility] URL: [email or web address] Date: 01/11/2023 Prepared by: Lorayne Bender   ASSESSMENT:  CLINICAL IMPRESSION: Pt able to climb stairs reciprocally in clinic today with UE assist from single railing. Improvement in ability for eccentric step down compared to last session. She c/o R "catch" in distal quad with stationary bike use, so spent time of IASTM to decrease any scar adhesions present here. She remains tight into end range R knee flexion. Instructed her continue continue with self stretching bilaterally to improve this. Pt is progressing excellently.   OBJECTIVE IMPAIRMENTS: Abnormal gait, decreased activity tolerance, decreased knowledge of use of DME, decreased mobility, difficulty walking, decreased ROM, decreased strength, and pain.   ACTIVITY LIMITATIONS: carrying, lifting, bending, sitting, standing, squatting, sleeping, stairs, transfers, bed mobility, bathing, dressing, and locomotion level  PARTICIPATION LIMITATIONS: meal prep, cleaning, laundry, driving, shopping, community activity, occupation, and yard work  PERSONAL FACTORS: 1-2 comorbidities: polymyalgia rheumatica, anxiety, and depression   are also affecting patient's functional outcome.   REHAB POTENTIAL: Good  CLINICAL DECISION MAKING: Stable/uncomplicated  EVALUATION COMPLEXITY: Low   GOALS: Goals reviewed with patient? Yes  SHORT TERM GOALS: Target date: 02/08/2023   Patient will increase left knee flexion to 110 degrees Baseline: Goal status: INITIAL  2.  Patient will demonstrate full left knee  extension Baseline:  Goal status: INITIAL  3.  Patient will ambulate 300 feet with single-point cane  showing good safety and weightbearing on the right side Baseline:  Goal status: INITIAL  4.  Patient will be independent with initial exercise program Baseline:  Goal status: INITIAL  LONG TERM GOALS: Target date: 03/08/2023    Patient will go up and down 8 steps with reciprocal gait in order to ambulate in the community Baseline:  Goal status: INITIAL  2.  Patient will ambulate 3000 feet with least restrictive assistive device in order to go shopping and walk her dog Baseline:  Goal status: INITIAL  3.  Patient will be independent with complete exercise program Baseline:  Goal status: INITIAL  4.  Patient will stand for greater than 1 hour without increased right knee pain in order to perform ADLs and IADLs Baseline:  Goal status: INITIAL   PLAN:  PT FREQUENCY: 2x/week  PT DURATION: 8 weeks  PLANNED INTERVENTIONS: Therapeutic exercises, Therapeutic activity, Neuromuscular re-education, Balance training, Gait training, Patient/Family education, Self Care, Joint mobilization, Stair training, Aquatic Therapy, Dry Needling, Cryotherapy, Moist heat, Ultrasound, and Manual therapy  PLAN FOR NEXT SESSION: begin PROM into flexion and extension. Progress to SLR as tolerated and SAQ; progress standing exercises as tolerated.   Riki Altes, PTA  06/14/2023, 11:42 AM

## 2023-06-18 ENCOUNTER — Encounter (HOSPITAL_BASED_OUTPATIENT_CLINIC_OR_DEPARTMENT_OTHER): Payer: Self-pay | Admitting: Physical Therapy

## 2023-06-18 ENCOUNTER — Ambulatory Visit (HOSPITAL_BASED_OUTPATIENT_CLINIC_OR_DEPARTMENT_OTHER): Payer: BC Managed Care – PPO | Admitting: Physical Therapy

## 2023-06-18 DIAGNOSIS — R2689 Other abnormalities of gait and mobility: Secondary | ICD-10-CM

## 2023-06-18 DIAGNOSIS — M6281 Muscle weakness (generalized): Secondary | ICD-10-CM | POA: Diagnosis not present

## 2023-06-18 DIAGNOSIS — R600 Localized edema: Secondary | ICD-10-CM

## 2023-06-18 DIAGNOSIS — M25662 Stiffness of left knee, not elsewhere classified: Secondary | ICD-10-CM

## 2023-06-18 NOTE — Therapy (Signed)
OUTPATIENT PHYSICAL THERAPY LOWER EXTREMITY TREATMENT   Patient Name: Diana Herman MRN: [id number] DOB:Jun 14, 1966, 57 y.o., female Today's Date: 06/18/2023  END OF SESSION:  PT End of Session - 06/18/23 1020     Visit Number 11    Number of Visits 16    Date for PT Re-Evaluation 06/24/23    Authorization Type BCBS    PT Start Time 1018    PT Stop Time 1100    PT Time Calculation (min) 42 min    Activity Tolerance Patient tolerated treatment well    Behavior During Therapy WFL for tasks assessed/performed                  Past Medical History:  Diagnosis Date   Achilles rupture, right 08/18/2018   Formatting of this note might be different from the original. 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   Severe sepsis with acute organ dysfunction (HCC) 10/15/2021   Sleep apnea    wears cpap    Past Surgical History:  Procedure Laterality Date   ACHILLES TENDON SURGERY Right    COLONOSCOPY  04/14/2021   [facility]   COLONOSCOPY WITH ESOPHAGOGASTRODUODENOSCOPY (EGD)  01/2022   HYSTERECTOMY     hysteroscopy and polypectomy     POLYPECTOMY     TONSILLECTOMY     WISDOM TOOTH EXTRACTION     early 90's    Patient Active Problem List   Diagnosis Date Noted   Depression with anxiety 10/15/2021   Nausea vomiting and diarrhea 08/13/2021   Type 2 diabetes mellitus without complication  (HCC) 08/13/2021   Hx of adenomatous colonic polyps 04/28/2021   Long term current use of systemic steroids 09/21/2020   Primary hypertension 08/30/2020   Morbid obesity (HCC) 01/01/2019   Polymyalgia rheumatica (HCC) 08/27/2017   Primary osteoarthritis of left knee 06/30/2016   Primary osteoarthritis of right knee 06/30/2016   Anxiety 01/17/2016   Impaired glucose tolerance 01/17/2016   Mixed hyperlipidemia 01/17/2016   OSA on CPAP 01/17/2016    PCP: Pricilla Holm   REFERRING PROVIDER: Darnell Level MD   REFERRING DIAG: Left TKA   THERAPY DIAG:  Localized edema  Muscle weakness (generalized)  Other abnormalities of gait and mobility  Stiffness of left knee, not elsewhere classified  Rationale for Evaluation and Treatment: Rehabilitation  ONSET DATE: 04/23/2023  SUBJECTIVE:   SUBJECTIVE STATEMENT: Pt reports no pain at entry. Taking prednisone dose pack for gout.   PERTINENT HISTORY: Right achilles repair, polymyalgia rheumatica; OA of both knees, sleep apneas. Depression, anxiety PAIN:  Are you having pain? No  PRECAUTIONS: Knee  WEIGHT BEARING RESTRICTIONS: Yes WBAT   FALLS:  Has patient fallen in last 6 months? 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   Hobbies:  Play with her dog; walk her dog  PLOF: Independent  PATIENT GOALS:   To be able to do normal activity without increased pain   NEXT MD VISIT:  March 1st    OBJECTIVE:   DIAGNOSTIC FINDINGS: nothing post op  PATIENT SURVEYS:  FOTO    COGNITION: Overall cognitive status: Within functional limits for tasks assessed     SENSATION: Denies paresthesias   EDEMA:  Nothing significant  MUSCLE LENGTH:  POSTURE: No Significant postural limitations  PALPATION: No unexpected TTP   LOWER EXTREMITY ROM:  Passive ROM Right eval Left eval Left  Left 6/20    Hip flexion       Hip extension       Hip  abduction       Hip adduction       Hip internal rotation       Hip external rotation       Knee flexion  85 90 106 111  Knee extension  -10 -5 -5 -6  Ankle dorsiflexion       Ankle plantarflexion       Ankle inversion       Ankle eversion        (Blank rows = not tested)  LOWER EXTREMITY MMT:  MMT Right eval Left eval  Hip flexion    Hip extension    Hip abduction    Hip adduction    Hip internal rotation    Hip external rotation    Knee flexion    Knee extension    Ankle dorsiflexion    Ankle plantarflexion    Ankle inversion    Ankle eversion     (Blank rows = not tested) not tested secondary recent surgery   FUNCTIONAL TESTS:  Bed mobility: Uses arms to lift right leg up onto the bed. Sit to stand transfer: Uses arms to push up. GAIT: Using a rolling walker. Decreased weight bearing on the left. Using UE to take weight off.   TODAY'S TREATMENT:                                                                                                                              DATE:  7/20 Nu-step L5 6 min   6 inch step up x20  Lateral step up x20   Leg press 2x15 50 lbs   LF hip abduction 55 lbs 3x15   Reviewed set up of gym exercises   Squats 2x10    7/26  Sci fit biks full revs L 3  Stair climbing- 1/2 flight reciprocal, full flight reciprocal 1 rail Eccentric heel taps-bottom stair x10ea  Manual: IASTM to distal R quads PROM bilateral knee flexion  Walking marching 43ft x2 Side stepping with GTB at ankles  Lunges (static) x10ea at rail Walk 1 lap around track upstairs   7/23  Sci fit  biks partial and full revs  Long sit HSS- 30sec x3L SLR 3x10  Bridge 2x15 staggered-L behind  Manual: PROM into flexion/extension. Overpressure with extension  LAQ in pain free range 5" hold 2x10, 5#   Standing march 2x10 Partial squats 2x10 Staggered Sit to stands 2x10 from low plinth L behind Gait fwd and backward 48ft ea Walking marching  55ft x2  Step ups 4" 2x10 L Postioer slide lunge with pillowcase under R foot   PATIENT EDUCATION:  Education details: HEP, symptom management;  Person educated: Patient Education method: Explanation, Demonstration, Tactile cues, Verbal cues, and Handouts Education comprehension: verbalized understanding, returned demonstration, verbal cues required, tactile cues required, and needs further education  HOME EXERCISE PROGRAM: Access Code: [facility] URL: [email or web address] Date: 01/11/2023 Prepared by: Lorayne Bender   ASSESSMENT:  CLINICAL IMPRESSION: The patient tolerated treatment well. She has reached full ROM. We reviewed use of gym equipment today. She plans on joing the gym. She talked about maybe doing yoga. We reviewed techniques to see if she can progreess to getting up and down off the ground. She did well with gym equipment. Likely D/C next visit  OBJECTIVE IMPAIRMENTS: Abnormal gait, decreased activity tolerance, decreased knowledge of use of DME, decreased mobility, difficulty walking, decreased ROM, decreased strength, and pain.   ACTIVITY LIMITATIONS: carrying, lifting, bending, sitting, standing, squatting, sleeping, stairs, transfers, bed mobility, bathing, dressing, and locomotion level  PARTICIPATION LIMITATIONS: meal prep, cleaning, laundry, driving, shopping, community activity, occupation, and yard work  PERSONAL FACTORS: 1-2 comorbidities: polymyalgia rheumatica, anxiety, and depression   are also affecting patient's functional outcome.   REHAB POTENTIAL: Good  CLINICAL DECISION MAKING: Stable/uncomplicated  EVALUATION COMPLEXITY: Low   GOALS: Goals reviewed with patient? Yes  SHORT TERM GOALS: Target date: 02/08/2023   Patient will increase left knee flexion to 110 degrees Baseline: Goal status: INITIAL  2.  Patient will demonstrate full left knee  extension Baseline:  Goal status: INITIAL  3.  Patient will ambulate 300 feet with  single-point cane showing good safety and weightbearing on the right side Baseline:  Goal status: INITIAL  4.  Patient will be independent with initial exercise program Baseline:  Goal status: INITIAL  LONG TERM GOALS: Target date: 03/08/2023    Patient will go up and down 8 steps with reciprocal gait in order to ambulate in the community Baseline:  Goal status: INITIAL  2.  Patient will ambulate 3000 feet with least restrictive assistive device in order to go shopping and walk her dog Baseline:  Goal status: INITIAL  3.  Patient will be independent with complete exercise program Baseline:  Goal status: INITIAL  4.  Patient will stand for greater than 1 hour without increased right knee pain in order to perform ADLs and IADLs Baseline:  Goal status: INITIAL   PLAN:  PT FREQUENCY: 2x/week  PT DURATION: 8 weeks  PLANNED INTERVENTIONS: Therapeutic exercises, Therapeutic activity, Neuromuscular re-education, Balance training, Gait training, Patient/Family education, Self Care, Joint mobilization, Stair training, Aquatic Therapy, Dry Needling, Cryotherapy, Moist heat, Ultrasound, and Manual therapy  PLAN FOR NEXT SESSION: begin PROM into flexion and extension. Progress to SLR as tolerated and SAQ; progress standing exercises as tolerated.   Riki Altes, PTA  06/18/2023, 10:45 AM

## 2023-06-21 ENCOUNTER — Encounter (HOSPITAL_BASED_OUTPATIENT_CLINIC_OR_DEPARTMENT_OTHER): Payer: BC Managed Care – PPO

## 2023-06-24 ENCOUNTER — Encounter (HOSPITAL_BASED_OUTPATIENT_CLINIC_OR_DEPARTMENT_OTHER): Payer: Self-pay

## 2023-06-24 ENCOUNTER — Ambulatory Visit (HOSPITAL_BASED_OUTPATIENT_CLINIC_OR_DEPARTMENT_OTHER): Payer: BC Managed Care – PPO | Admitting: Physical Therapy

## 2023-07-17 ENCOUNTER — Ambulatory Visit (HOSPITAL_BASED_OUTPATIENT_CLINIC_OR_DEPARTMENT_OTHER): Payer: BC Managed Care – PPO | Attending: Sports Medicine | Admitting: Physical Therapy

## 2023-07-17 ENCOUNTER — Encounter (HOSPITAL_BASED_OUTPATIENT_CLINIC_OR_DEPARTMENT_OTHER): Payer: Self-pay | Admitting: Physical Therapy

## 2023-07-17 DIAGNOSIS — R600 Localized edema: Secondary | ICD-10-CM | POA: Insufficient documentation

## 2023-07-17 DIAGNOSIS — R2689 Other abnormalities of gait and mobility: Secondary | ICD-10-CM | POA: Insufficient documentation

## 2023-07-17 DIAGNOSIS — M25662 Stiffness of left knee, not elsewhere classified: Secondary | ICD-10-CM | POA: Diagnosis present

## 2023-07-17 DIAGNOSIS — M25562 Pain in left knee: Secondary | ICD-10-CM | POA: Diagnosis present

## 2023-07-17 DIAGNOSIS — M6281 Muscle weakness (generalized): Secondary | ICD-10-CM | POA: Insufficient documentation

## 2023-07-17 NOTE — Therapy (Signed)
OUTPATIENT PHYSICAL THERAPY LOWER EXTREMITY TREATMENT/Discharge    Patient Name: Diana Herman MRN: [id number] DOB:Jul 13, 1966, 57 y.o., female Today's Date: 07/17/2023  END OF SESSION:  PT End of Session - 07/17/23 0903     Visit Number 12    Number of Visits 16    Date for PT Re-Evaluation 06/24/23    Authorization Type [employer]    PT Start Time [phone]    PT Stop Time 0930    PT Time Calculation (min) 43 min    Activity Tolerance Patient tolerated treatment well    Behavior During Therapy [facility] for tasks assessed/performed                   Past Medical History:  Diagnosis Date   Achilles rupture, right 08/18/2018   Formatting of this note might be different from the original. 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   Severe sepsis with acute organ dysfunction (HCC) 10/15/2021   Sleep apnea    wears cpap    Past Surgical History:  Procedure Laterality Date   ACHILLES TENDON SURGERY Right    COLONOSCOPY  04/14/2021   [facility]   COLONOSCOPY WITH ESOPHAGOGASTRODUODENOSCOPY (EGD)  01/2022   HYSTERECTOMY     hysteroscopy and polypectomy     POLYPECTOMY     TONSILLECTOMY     WISDOM TOOTH EXTRACTION     early 90's    Patient Active Problem List   Diagnosis Date Noted   Depression with anxiety 10/15/2021   Nausea vomiting and diarrhea 08/13/2021   Type 2 diabetes mellitus without  complication (HCC) 08/13/2021   Hx of adenomatous colonic polyps 04/28/2021   Long term current use of systemic steroids 09/21/2020   Primary hypertension 08/30/2020   Morbid obesity (HCC) 01/01/2019   Polymyalgia rheumatica (HCC) 08/27/2017   Primary osteoarthritis of left knee 06/30/2016   Primary osteoarthritis of right knee 06/30/2016   Anxiety 01/17/2016   Impaired glucose tolerance 01/17/2016   Mixed hyperlipidemia 01/17/2016   OSA on CPAP 01/17/2016    PCP: Pricilla Holm   REFERRING PROVIDER: Darnell Level MD   REFERRING DIAG: Left TKA   THERAPY DIAG:  No diagnosis found.  Rationale for Evaluation and Treatment: Rehabilitation  ONSET DATE: 04/23/2023  SUBJECTIVE:   SUBJECTIVE STATEMENT: Continues to have no issues. Mild strength difference noted going down stairs   PERTINENT HISTORY: Right achilles repair, polymyalgia rheumatica; OA of both knees, sleep apneas. Depression, anxiety PAIN:  Are you having pain? No  PRECAUTIONS: Knee  WEIGHT BEARING RESTRICTIONS: Yes WBAT   FALLS:  Has patient fallen in last 6 months? 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   Hobbies:  Play with her dog; walk her dog  PLOF: Independent  PATIENT GOALS:   To be able to do normal activity without increased pain   NEXT MD VISIT:  March 1st    OBJECTIVE:   DIAGNOSTIC FINDINGS: nothing post op  PATIENT SURVEYS:  FOTO    COGNITION: Overall cognitive status: Within functional limits for tasks assessed     SENSATION: Denies paresthesias   EDEMA:  Nothing significant  MUSCLE LENGTH:  POSTURE: No Significant postural limitations  PALPATION: No unexpected TTP   LOWER EXTREMITY ROM:  Passive ROM Right eval Left eval Left  Left 6/20    Hip flexion       Hip extension       Hip abduction       Hip adduction       Hip internal rotation       Hip external rotation       Knee  flexion  85 90 106 111  Knee extension  -10 -5 -5 -6  Ankle dorsiflexion       Ankle plantarflexion       Ankle inversion       Ankle eversion        (Blank rows = not tested)  LOWER EXTREMITY MMT:  MMT Right 8/28 Left 8/28  Hip flexion 33.2 37.5  Hip extension    Hip abduction 36.5 38.0  Hip adduction    Hip internal rotation    Hip external rotation    Knee flexion    Knee extension 45.1 44.2  Ankle dorsiflexion    Ankle plantarflexion    Ankle inversion    Ankle eversion     (Blank rows = not tested) not tested secondary recent surgery   FUNCTIONAL TESTS:  Bed mobility: Uses arms to lift right leg up onto the bed. Sit to stand transfer: Uses arms to push up. GAIT: Using a rolling walker. Decreased weight bearing on the left. Using UE to take weight off.   TODAY'S TREATMENT:                                                                                                                              DATE:  8/26 Nu-step L5 6 min   6 inch step up x20  Lateral step up x20  Step down 4 inch x20   SLR x20  SAQ 2.5 lbs x20   Reviewed strength measurements and photo scores  Reviewed goals     7/20 Nu-step L5 6 min   6 inch step up x20  Lateral step up x20   Leg press 2x15 50 lbs   LF hip abduction 55 lbs 3x15   Reviewed set up of gym exercises   Squats 2x10    7/26  Sci fit biks full revs L 3  Stair climbing- 1/2 flight reciprocal, full flight reciprocal 1 rail Eccentric heel taps-bottom stair x10ea  Manual: IASTM to distal R quads PROM bilateral  knee flexion  Walking marching 54ft x2 Side stepping with GTB at ankles  Lunges (static) x10ea at rail Walk 1 lap around track upstairs   7/23  Sci fit biks partial and full revs  Long sit HSS- 30sec x3L SLR 3x10  Bridge 2x15 staggered-L behind  Manual: PROM into flexion/extension. Overpressure with extension  LAQ in pain free range 5" hold 2x10, 5#   Standing march  2x10 Partial squats 2x10 Staggered Sit to stands 2x10 from low plinth L behind Gait fwd and backward 48ft ea Walking marching 37ft x2  Step ups 4" 2x10 L Postioer slide lunge with pillowcase under R foot   PATIENT EDUCATION:  Education details: HEP, symptom management;  Person educated: Patient Education method: Explanation, Demonstration, Tactile cues, Verbal cues, and Handouts Education comprehension: verbalized understanding, returned demonstration, verbal cues required, tactile cues required, and needs further education  HOME EXERCISE PROGRAM: Access Code: [facility] URL: [email or web address] Date: 01/11/2023 Prepared by: Lorayne Bender   ASSESSMENT:  CLINICAL IMPRESSION: The patient has made excellent progress.  She continues to feel mild strength deficits when going downstairs.  Otherwise she is having no pain and able to perform daily activities without difficulty.  She is having some pain in her shoulders.  She may return to physical therapy for her shoulders if warranted.  Therapy will discharge left knee episode at this time.  See below for goal specific progress. OBJECTIVE IMPAIRMENTS: Abnormal gait, decreased activity tolerance, decreased knowledge of use of DME, decreased mobility, difficulty walking, decreased ROM, decreased strength, and pain.   ACTIVITY LIMITATIONS: carrying, lifting, bending, sitting, standing, squatting, sleeping, stairs, transfers, bed mobility, bathing, dressing, and locomotion level  PARTICIPATION LIMITATIONS: meal prep, cleaning, laundry, driving, shopping, community activity, occupation, and yard work  PERSONAL FACTORS: 1-2 comorbidities: polymyalgia rheumatica, anxiety, and depression   are also affecting patient's functional outcome.   REHAB POTENTIAL: Good  CLINICAL DECISION MAKING: Stable/uncomplicated  EVALUATION COMPLEXITY: Low   GOALS: Goals reviewed with patient? Yes  SHORT TERM GOALS: Target date: 02/08/2023    Patient will increase left knee flexion to 110 degrees Baseline: Goal status: Full 8/28  2.  Patient will demonstrate full left knee  extension Baseline:  Goal status: Full 8/28  3.  Patient will ambulate 300 feet with single-point cane showing good safety and weightbearing on the right side Baseline:  Goal status: Walking without difficulty achieved 8/28  4.  Patient will be independent with initial exercise program Baseline:  Goal status: Achieved 8/28  LONG TERM GOALS: Target date: 03/08/2023    Patient will go up and down 8 steps with reciprocal gait in order to ambulate in the community Baseline:  Goal status: Performing a reciprocal pattern achieved 8/28  2.  Patient will ambulate 3000 feet with least restrictive assistive device in order to go shopping and walk her dog Baseline:  Goal status: Ambulating community distances without difficulty 8/28  3.  Patient will be independent with complete exercise program Baseline:  Goal status: Has complete exercise program 8/28  4.  Patient will stand for greater than 1 hour without increased right knee pain in order to perform ADLs and IADLs Baseline:  Goal status: Can stand for greater than an hour to perform ADLs 8/28   PLAN:  PT FREQUENCY: 2x/week  PT DURATION: 8 weeks  PLANNED INTERVENTIONS: Therapeutic exercises, Therapeutic activity, Neuromuscular re-education, Balance training, Gait training, Patient/Family education, Self Care, Joint mobilization, Stair training, Aquatic Therapy, Dry Needling, Cryotherapy, Moist heat, Ultrasound, and Manual therapy  PLAN FOR NEXT SESSION: begin PROM into flexion and extension. Progress to SLR as tolerated and SAQ; progress standing exercises as tolerated.  Lorayne Bender PT DPT  07/17/2023, 9:03 AM

## 2023-08-15 ENCOUNTER — Other Ambulatory Visit: Payer: Self-pay | Admitting: Internal Medicine

## 2023-08-29 ENCOUNTER — Ambulatory Visit (HOSPITAL_BASED_OUTPATIENT_CLINIC_OR_DEPARTMENT_OTHER): Payer: BC Managed Care – PPO | Attending: Sports Medicine | Admitting: Physical Therapy

## 2023-08-29 DIAGNOSIS — M6281 Muscle weakness (generalized): Secondary | ICD-10-CM | POA: Insufficient documentation

## 2023-08-29 DIAGNOSIS — M25512 Pain in left shoulder: Secondary | ICD-10-CM | POA: Diagnosis present

## 2023-08-29 DIAGNOSIS — R600 Localized edema: Secondary | ICD-10-CM | POA: Insufficient documentation

## 2023-08-29 DIAGNOSIS — M25561 Pain in right knee: Secondary | ICD-10-CM | POA: Insufficient documentation

## 2023-08-29 DIAGNOSIS — R293 Abnormal posture: Secondary | ICD-10-CM | POA: Diagnosis present

## 2023-08-29 NOTE — Therapy (Signed)
OUTPATIENT PHYSICAL THERAPY UPPER EXTREMITY EVALUATION   Patient Name: Diana Herman MRN: [id number] DOB:05-16-1966, 57 y.o., female Today's Date: 08/30/2023  END OF SESSION:  PT End of Session - 08/30/23 7846     Visit Number 1    Number of Visits 8    Date for PT Re-Evaluation 10/25/23    PT Start Time 1351    PT Stop Time 1428    PT Time Calculation (min) 37 min    Activity Tolerance Patient tolerated treatment well    Behavior During Therapy [facility] for tasks assessed/performed             Past Medical History:  Diagnosis Date   Achilles rupture, right 08/18/2018   Formatting of this note might be different from the original. 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 09/2021   Severe sepsis with acute organ dysfunction (HCC) 10/15/2021   Sleep apnea    wears cpap    Past Surgical History:  Procedure Laterality Date   ACHILLES TENDON SURGERY Right    COLONOSCOPY  04/14/2021   [facility]   COLONOSCOPY WITH ESOPHAGOGASTRODUODENOSCOPY (EGD)  01/2022   HYSTERECTOMY     hysteroscopy and polypectomy     POLYPECTOMY     TONSILLECTOMY     WISDOM TOOTH EXTRACTION     early 90's    Patient Active Problem List   Diagnosis Date Noted   Depression with anxiety 10/15/2021   Nausea vomiting and diarrhea 08/13/2021   Type 2 diabetes mellitus without complication (HCC) 08/13/2021   Hx of adenomatous  colonic polyps 04/28/2021   Long term current use of systemic steroids 09/21/2020   Primary hypertension 08/30/2020   Morbid obesity (HCC) 01/01/2019   Polymyalgia rheumatica (HCC) 08/27/2017   Primary osteoarthritis of left knee 06/30/2016   Primary osteoarthritis of right knee 06/30/2016   Anxiety 01/17/2016   Impaired glucose tolerance 01/17/2016   Mixed hyperlipidemia 01/17/2016   OSA on CPAP 01/17/2016    PCP: Pricilla Holm MD  REFERRING PROVIDER: Darnell Level MD   REFERRING DIAG:   THERAPY DIAG:  Acute pain of left shoulder  Acute pain of right knee  Abnormal posture  Rationale for Evaluation and Treatment: Rehabilitation  ONSET DATE: 5 months prior   SUBJECTIVE:  SUBJECTIVE STATEMENT: Patient began having gradual onset of bilateral shoulder pain prior to her knee surgeries.  She decided to have her knee surgeries first.  The pain is progressively increased over that time.  She has had pain for approximately 6 to 8 months.  She feels like her left is worse than her right.  She has a progressive loss of ability to reach overhead and reach behind her back.  Most of the pain is in the front of her shoulder.  She does a lot of work on [employer].  She also has increased pain when she sleeps on her shoulders. Hand dominance: Right  PERTINENT HISTORY: Bilateral  knee replacement;   PAIN:  Are you having pain? Yes: NPRS scale: 6/10 at worst  Pain location: left shoulder  Pain description: sharp  Aggravating factors: flexion and reaching behind the back  Relieving factors: time   Are you having pain? Yes: NPRS scale: 4/10 at worst  Pain location: left shoudler  Pain description:  sharp  Aggravating factors: use of the shoulder  Relieving factors: time  PRECAUTIONS: None  RED  FLAGS: None   WEIGHT BEARING RESTRICTIONS: No  FALLS:  Has patient fallen in last 6 months? 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  DIAGNOSTIC FINDINGS:  No diagnostic findings  PATIENT SURVEYS :  FOTO    COGNITION: Overall cognitive status: Within functional limits for tasks assessed     SENSATION: WFL  POSTURE:   UPPER EXTREMITY ROM:   Active ROM Right eval Left eval  Shoulder flexion 103 102  Shoulder extension    Shoulder abduction    Shoulder adduction    Shoulder internal rotation To L5  To gluteal   Shoulder external rotation full painful  Elbow flexion    Elbow extension    Wrist flexion    Wrist extension    Wrist ulnar deviation    Wrist radial deviation    Wrist pronation    Wrist supination    (Blank rows = not tested)  UPPER EXTREMITY MMT:  MMT Right eval Left eval  Shoulder flexion 4 4  Shoulder extension    Shoulder abduction    Shoulder adduction    Shoulder internal rotation 4+ 4+  Shoulder external rotation 3+ 3+  Middle trapezius    Lower trapezius    Elbow flexion    Elbow extension    Wrist flexion    Wrist extension    Wrist ulnar deviation    Wrist radial deviation    Wrist pronation    Wrist supination    Grip strength (lbs)    (Blank rows = not tested)      PALPATION:  Spasming of upper trap; right mild tenderness to palpation in biceps groove.  No tenderness to palpation in biceps groove on the left   TODAY'S TREATMENT:  DATE:  Access Code: [email or web address] URL: [email or web address] Date: 08/29/2023 Prepared by: Lorayne Bender  Exercises - Seated Upper Trapezius Stretch  - 1 x  daily - 7 x weekly - 3 sets - 3 reps - 20  hold - Theracane Over Shoulder  - 1 x daily - 7 x weekly - 3 sets - 10 reps - Supine Shoulder Flexion Extension AAROM with Dowel  - 1 x daily - 7 x weekly - 3 sets - 10 reps - Shoulder extension with resistance - Neutral  - 1 x daily - 7 x weekly - 3 sets - 10 reps - Scapular Retraction with Resistance  - 1 x daily - 7 x weekly - 3 sets - 10 reps  Manual: Reviewed bilateral trigger point release to upper traps PATIENT EDUCATION: Education details: HEP, symptom management  Person educated: Patient Education method: Explanation, Demonstration, Tactile cues, Verbal cues, and Handouts Education comprehension: verbalized understanding, returned demonstration, verbal cues required, tactile cues required, and needs further education  HOME EXERCISE PROGRAM: Access Code: [email or web address] URL: [email or web address] Date: 08/29/2023 Prepared by: Lorayne Bender  Exercises - Seated Upper Trapezius Stretch  - 1 x daily - 7 x weekly - 3 sets - 3 reps - 20  hold - Theracane Over Shoulder  - 1 x daily - 7 x weekly - 3 sets - 10 reps - Supine Shoulder Flexion Extension AAROM with Dowel  - 1 x daily - 7 x weekly - 3 sets - 10 reps - Shoulder extension with resistance - Neutral  - 1 x daily - 7 x weekly - 3 sets - 10 reps - Scapular Retraction with Resistance  - 1 x daily - 7 x weekly - 3 sets - 10 reps  ASSESSMENT:  CLINICAL IMPRESSION: Patient is a 57 year old female with gradual onset of bilateral shoulder pain left greater than right.  She presents with limited active and passive flexion, ER, active IR.  She has bilateral weakness particularly in ER.  She has rounded shoulders.  Signs and symptoms are consistent with bilateral rotator cuff impingement.  She would benefit from skilled therapy to improve ability to use shoulders for general function.  OBJECTIVE IMPAIRMENTS: decreased activity tolerance, decreased strength, impaired UE functional use,  postural dysfunction, and pain.   ACTIVITY LIMITATIONS: carrying, lifting, reach over head, and hygiene/grooming  PARTICIPATION LIMITATIONS: meal prep, cleaning, community activity, occupation, and yard work  PERSONAL FACTORS: 1 comorbidity: bilateral knee replacement   are also affecting patient's functional outcome.   REHAB POTENTIAL: Good  CLINICAL DECISION MAKING: Stable/uncomplicated  EVALUATION COMPLEXITY: Low  GOALS: Goals reviewed with patient? Yes  SHORT TERM GOALS: Target date: 09/26/2023    Patient will increase active bilateral shoulder flexion by 15 degrees Baseline: Goal status: INITIAL  2.  Patient will increase bilateral shoulder gross strength by 1 grade Baseline:  Goal status: INITIAL  3.  Patient will reach behind back to L5 without significant pain Baseline:  Goal status: INITIAL  4.  Patient will be independent with initial HEP Baseline:  Goal status: INITIAL   LONG TERM GOALS: Target date: 10/24/2023    Patient will use bilateral shoulders for ADLs without increased pain Baseline:  Goal status: INITIAL  2.  Patient will sleep through the night without increased pain Baseline:  Goal status: INITIAL  3.  Patient will reach overhead that shelves without increased pain Baseline:  Goal status: INITIAL  4.  Patient will have complete upper extremity strengthening and postural correction program to prevent further exacerbation of  shoulder pain Baseline:  Goal status: INITIAL  PLAN: PT FREQUENCY: 2x/week  PT DURATION: 8 weeks  PLANNED INTERVENTIONS: Therapeutic exercises, Therapeutic activity, Neuromuscular re-education, Balance training, Gait training, Patient/Family education, Self Care, Joint mobilization, Stair training, DME instructions, Aquatic Therapy, Dry Needling, Electrical stimulation, Cryotherapy, Moist heat, Taping, Manual therapy, and Re-evaluation.   PLAN FOR NEXT SESSION: Consider manual therapy to improve passive and active  range of motion.  Consider PA glides and inferior glides.  Consider trigger point dry needling to bilateral upper traps.  Review HEP.  Consider rotator cuff isometrics with progression to active rotator cuff movement and strengthening.  Consider supine ABC if tolerated.  Progress rows and extensions per RPE.   Dessie Coma, PT 08/30/2023, 8:13 AM

## 2023-08-30 ENCOUNTER — Encounter (HOSPITAL_BASED_OUTPATIENT_CLINIC_OR_DEPARTMENT_OTHER): Payer: Self-pay | Admitting: Physical Therapy

## 2023-09-05 ENCOUNTER — Encounter (HOSPITAL_BASED_OUTPATIENT_CLINIC_OR_DEPARTMENT_OTHER): Payer: Self-pay

## 2023-09-05 ENCOUNTER — Ambulatory Visit (HOSPITAL_BASED_OUTPATIENT_CLINIC_OR_DEPARTMENT_OTHER): Payer: BC Managed Care – PPO | Admitting: Physical Therapy

## 2023-09-10 ENCOUNTER — Encounter (HOSPITAL_BASED_OUTPATIENT_CLINIC_OR_DEPARTMENT_OTHER): Payer: Self-pay | Admitting: Physical Therapy

## 2023-09-10 ENCOUNTER — Ambulatory Visit (HOSPITAL_BASED_OUTPATIENT_CLINIC_OR_DEPARTMENT_OTHER): Payer: BC Managed Care – PPO | Admitting: Physical Therapy

## 2023-09-10 DIAGNOSIS — M25512 Pain in left shoulder: Secondary | ICD-10-CM | POA: Diagnosis not present

## 2023-09-10 DIAGNOSIS — M25561 Pain in right knee: Secondary | ICD-10-CM

## 2023-09-10 DIAGNOSIS — R600 Localized edema: Secondary | ICD-10-CM

## 2023-09-10 DIAGNOSIS — R293 Abnormal posture: Secondary | ICD-10-CM

## 2023-09-10 DIAGNOSIS — M6281 Muscle weakness (generalized): Secondary | ICD-10-CM

## 2023-09-10 NOTE — Therapy (Signed)
OUTPATIENT PHYSICAL THERAPY UPPER EXTREMITY EVALUATION   Patient Name: Diana Herman MRN: [id number] DOB:1966/04/12, 57 y.o., female Today's Date: 09/10/2023  END OF SESSION:  PT End of Session - 09/10/23 1301     Visit Number 2    Number of Visits 8    Date for PT Re-Evaluation 10/25/23    Authorization Type BCBS    PT Start Time 1300    PT Stop Time 1343    PT Time Calculation (min) 43 min    Activity Tolerance Patient tolerated treatment well    Behavior During Therapy WFL for tasks assessed/performed             Past Medical History:  Diagnosis Date   Achilles rupture, right 08/18/2018   Formatting of this note might be different from the original. 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   Severe sepsis with acute organ dysfunction (HCC) 10/15/2021   Sleep apnea    wears cpap    Past Surgical History:  Procedure Laterality Date   ACHILLES TENDON SURGERY Right    COLONOSCOPY  04/14/2021   [facility]   COLONOSCOPY WITH ESOPHAGOGASTRODUODENOSCOPY (EGD)  01/2022   HYSTERECTOMY     hysteroscopy and polypectomy     POLYPECTOMY     TONSILLECTOMY     WISDOM TOOTH EXTRACTION     early 90's    Patient Active Problem List   Diagnosis Date Noted   Depression with anxiety 10/15/2021   Nausea vomiting and diarrhea 08/13/2021   Type 2 diabetes mellitus without complication (HCC)  08/13/2021   Hx of adenomatous colonic polyps 04/28/2021   Long term current use of systemic steroids 09/21/2020   Primary hypertension 08/30/2020   Morbid obesity (HCC) 01/01/2019   Polymyalgia rheumatica (HCC) 08/27/2017   Primary osteoarthritis of left knee 06/30/2016   Primary osteoarthritis of right knee 06/30/2016   Anxiety 01/17/2016   Impaired glucose tolerance 01/17/2016   Mixed hyperlipidemia 01/17/2016   OSA on CPAP 01/17/2016    PCP: Pricilla Holm MD  REFERRING PROVIDER: Darnell Level MD   REFERRING DIAG:   THERAPY DIAG:  Acute pain of left shoulder  Acute pain of right knee  Abnormal posture  Localized edema  Muscle weakness (generalized)  Rationale for Evaluation and Treatment: Rehabilitation  ONSET DATE: 5 months prior   SUBJECTIVE:  SUBJECTIVE STATEMENT: Patient began having gradual onset of bilateral shoulder pain prior to her knee surgeries.  She decided to have her knee surgeries first.  The pain is progressively increased over that time.  She has had pain for approximately 6 to 8 months.  She feels like her left is worse than her right.  She has a progressive loss of ability to reach overhead and reach behind her back.  Most of the pain is in the front of her shoulder.  She does a lot of work on [employer].  She also has increased pain when she sleeps on her shoulders. Hand dominance: Right  PERTINENT HISTORY: Bilateral  knee replacement;   PAIN:  Are you having pain? Yes: NPRS scale: 6/10 at worst  Pain location: left shoulder  Pain description: sharp  Aggravating factors: flexion and reaching behind the back  Relieving factors: time   Are you having pain? Yes: NPRS scale: 4/10 at worst  Pain location: left shoudler  Pain description:  sharp  Aggravating factors:  use of the shoulder  Relieving factors: time  PRECAUTIONS: None  RED FLAGS: None   WEIGHT BEARING RESTRICTIONS: No  FALLS:  Has patient fallen in last 6 months? 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  DIAGNOSTIC FINDINGS:  No diagnostic findings  PATIENT SURVEYS :  FOTO    COGNITION: Overall cognitive status: Within functional limits for tasks assessed     SENSATION: WFL  POSTURE:   UPPER EXTREMITY ROM:   Active ROM Right eval Left eval  Shoulder flexion 103 102  Shoulder extension    Shoulder abduction    Shoulder adduction    Shoulder internal rotation To L5  To gluteal   Shoulder external rotation full painful  Elbow flexion    Elbow extension    Wrist flexion    Wrist extension    Wrist ulnar deviation    Wrist radial deviation    Wrist pronation    Wrist supination    (Blank rows = not tested)  UPPER EXTREMITY MMT:  MMT Right eval Left eval  Shoulder flexion 4 4  Shoulder extension    Shoulder abduction    Shoulder adduction    Shoulder internal rotation 4+ 4+  Shoulder external rotation 3+ 3+  Middle trapezius    Lower trapezius    Elbow flexion    Elbow extension    Wrist flexion    Wrist extension    Wrist ulnar deviation    Wrist radial deviation    Wrist pronation    Wrist supination    Grip strength (lbs)    (Blank rows = not tested)      PALPATION:  Spasming of upper trap; right mild tenderness to palpation in biceps groove.  No tenderness to palpation in biceps groove on the left   TODAY'S TREATMENT:  DATE:   10/22  Pulleys 2.5 min  Wand flexion 2x10  Bilateral er 3x10  Bilateral abc 1lb 1x  each    Reviewed HEP      Manual: PROM into flexion and ER bilateral grade II and III PA and iferior glides; trigger point release to bilateral upper traps.   Eval: Access Code: [email or web address] URL: [email or web address] Date: 08/29/2023 Prepared by: Lorayne Bender  Exercises - Seated Upper Trapezius Stretch  - 1 x daily - 7 x weekly - 3 sets - 3 reps - 20  hold - Theracane Over Shoulder  - 1 x daily - 7 x weekly - 3 sets - 10 reps - Supine Shoulder Flexion Extension AAROM with Dowel  - 1 x daily - 7 x weekly - 3 sets - 10 reps - Shoulder extension with resistance - Neutral  - 1 x daily - 7 x weekly - 3 sets - 10 reps - Scapular Retraction with Resistance  - 1 x daily - 7 x weekly - 3 sets - 10 reps  Manual: Reviewed bilateral trigger point release to upper traps PATIENT EDUCATION: Education details: HEP, symptom management  Person educated: Patient Education method: Explanation, Demonstration, Tactile cues, Verbal cues, and Handouts Education comprehension: verbalized understanding, returned demonstration, verbal cues required, tactile cues required, and needs further education  HOME EXERCISE PROGRAM: Access Code: [email or web address] URL: [email or web address] Date: 08/29/2023 Prepared by: Lorayne Bender  Exercises - Seated Upper Trapezius Stretch  - 1 x daily - 7 x weekly - 3 sets - 3 reps - 20  hold - Theracane Over Shoulder  - 1 x daily - 7 x weekly - 3 sets - 10 reps - Supine Shoulder Flexion Extension AAROM with Dowel  - 1 x daily - 7 x weekly - 3 sets - 10 reps - Shoulder extension with resistance - Neutral  - 1 x daily - 7 x weekly - 3 sets - 10 reps - Scapular Retraction with Resistance  - 1 x daily - 7 x weekly - 3 sets - 10 reps  ASSESSMENT:  CLINICAL IMPRESSION: The patient had limited left shoulder flexion upon arrival but it improved with manual therapy. We reviewed HEP. We added in supine ABC and bilateral ER> she had no pain with either. We  reviewed how to use her HEP at home. She had to leave early 2nd to work.    Eval: Patient is a 57 year old female with gradual onset of bilateral shoulder pain left greater than right.  She presents with limited active and passive flexion, ER, active IR.  She has bilateral weakness particularly in ER.  She has rounded shoulders.  Signs and symptoms are consistent with bilateral rotator cuff impingement.  She would benefit from skilled therapy to improve ability to use shoulders for general function.  OBJECTIVE IMPAIRMENTS: decreased activity tolerance, decreased strength, impaired UE functional use, postural dysfunction, and pain.   ACTIVITY LIMITATIONS: carrying, lifting, reach over head, and hygiene/grooming  PARTICIPATION LIMITATIONS: meal prep, cleaning, community activity, occupation, and yard work  PERSONAL FACTORS: 1 comorbidity: bilateral knee replacement   are also affecting patient's functional outcome.   REHAB POTENTIAL: Good  CLINICAL DECISION MAKING: Stable/uncomplicated  EVALUATION COMPLEXITY: Low  GOALS: Goals reviewed with patient? Yes  SHORT TERM GOALS: Target date: 09/26/2023    Patient will increase active bilateral shoulder flexion by 15 degrees Baseline: Goal status: INITIAL  2.  Patient will increase bilateral shoulder gross strength by 1 grade Baseline:  Goal status: INITIAL  3.  Patient  will reach behind back to L5 without significant pain Baseline:  Goal status: INITIAL  4.  Patient will be independent with initial HEP Baseline:  Goal status: INITIAL   LONG TERM GOALS: Target date: 10/24/2023    Patient will use bilateral shoulders for ADLs without increased pain Baseline:  Goal status: INITIAL  2.  Patient will sleep through the night without increased pain Baseline:  Goal status: INITIAL  3.  Patient will reach overhead that shelves without increased pain Baseline:  Goal status: INITIAL  4.  Patient will have complete upper  extremity strengthening and postural correction program to prevent further exacerbation of shoulder pain Baseline:  Goal status: INITIAL  PLAN: PT FREQUENCY: 2x/week  PT DURATION: 8 weeks  PLANNED INTERVENTIONS: Therapeutic exercises, Therapeutic activity, Neuromuscular re-education, Balance training, Gait training, Patient/Family education, Self Care, Joint mobilization, Stair training, DME instructions, Aquatic Therapy, Dry Needling, Electrical stimulation, Cryotherapy, Moist heat, Taping, Manual therapy, and Re-evaluation.   PLAN FOR NEXT SESSION: Consider manual therapy to improve passive and active range of motion.  Consider PA glides and inferior glides.  Consider trigger point dry needling to bilateral upper traps.  Review HEP.  Consider rotator cuff isometrics with progression to active rotator cuff movement and strengthening.  Consider supine ABC if tolerated.  Progress rows and extensions per RPE.   Dessie Coma, PT 09/10/2023, 1:09 PM

## 2023-09-17 ENCOUNTER — Encounter (HOSPITAL_BASED_OUTPATIENT_CLINIC_OR_DEPARTMENT_OTHER): Payer: Self-pay | Admitting: Physical Therapy

## 2023-09-17 ENCOUNTER — Ambulatory Visit (HOSPITAL_BASED_OUTPATIENT_CLINIC_OR_DEPARTMENT_OTHER): Payer: BC Managed Care – PPO | Admitting: Physical Therapy

## 2023-09-17 DIAGNOSIS — M25512 Pain in left shoulder: Secondary | ICD-10-CM

## 2023-09-17 DIAGNOSIS — M25561 Pain in right knee: Secondary | ICD-10-CM

## 2023-09-17 DIAGNOSIS — R293 Abnormal posture: Secondary | ICD-10-CM

## 2023-09-17 NOTE — Therapy (Signed)
OUTPATIENT PHYSICAL THERAPY UPPER EXTREMITY EVALUATION   Patient Name: Diana Herman MRN: [id number] DOB:09-Jul-1966, 57 y.o., female Today's Date: 09/17/2023  END OF SESSION:  PT End of Session - 09/17/23 1324     Visit Number 3    Number of Visits 8    Date for PT Re-Evaluation 10/25/23    Authorization Type BCBS    PT Start Time 1303    PT Stop Time 1344    PT Time Calculation (min) 41 min    Activity Tolerance Patient tolerated treatment well    Behavior During Therapy [facility] for tasks assessed/performed              Past Medical History:  Diagnosis Date   Achilles rupture, right 08/18/2018   Formatting of this note might be different from the original. 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   Severe sepsis with acute organ dysfunction (HCC) 10/15/2021   Sleep apnea    wears cpap    Past Surgical History:  Procedure Laterality Date   ACHILLES TENDON SURGERY Right    COLONOSCOPY  04/14/2021   [facility]   COLONOSCOPY WITH ESOPHAGOGASTRODUODENOSCOPY (EGD)  01/2022   HYSTERECTOMY     hysteroscopy and polypectomy     POLYPECTOMY     TONSILLECTOMY     WISDOM TOOTH EXTRACTION     early 90's    Patient Active Problem List   Diagnosis Date Noted   Depression with anxiety 10/15/2021   Nausea vomiting and diarrhea 08/13/2021   Type 2 diabetes mellitus without complication (HCC)  08/13/2021   Hx of adenomatous colonic polyps 04/28/2021   Long term current use of systemic steroids 09/21/2020   Primary hypertension 08/30/2020   Morbid obesity (HCC) 01/01/2019   Polymyalgia rheumatica (HCC) 08/27/2017   Primary osteoarthritis of left knee 06/30/2016   Primary osteoarthritis of right knee 06/30/2016   Anxiety 01/17/2016   Impaired glucose tolerance 01/17/2016   Mixed hyperlipidemia 01/17/2016   OSA on CPAP 01/17/2016    PCP: Pricilla Holm MD  REFERRING PROVIDER: Darnell Level MD   REFERRING DIAG:   THERAPY DIAG:  Acute pain of left shoulder  Acute pain of right knee  Abnormal posture  Rationale for Evaluation and Treatment: Rehabilitation  ONSET DATE: 5 months prior   SUBJECTIVE:  SUBJECTIVE STATEMENT: The patient reports use of both shoulder L> R is worse when she does actvitiy. Her left hurts pretty much whatever she does. Sleep continues to be difficult.     Eval: Patient began having gradual onset of bilateral shoulder pain prior to her knee surgeries.  She decided to have her knee surgeries first.  The pain is progressively increased over that time.  She has had pain for approximately 6 to 8 months.  She feels like her left is worse than her right.  She has a progressive loss of ability to reach overhead and reach behind her back.  Most of the pain is in the front of her shoulder.  She does a lot of work on [employer].  She also has increased pain when she sleeps on her shoulders. Hand dominance: Right  PERTINENT HISTORY: Bilateral  knee replacement;   PAIN:  Are you having pain? Yes: NPRS scale: 6/10 at worst  Pain location: left shoulder  Pain description: sharp  Aggravating factors: flexion and reaching behind the back  Relieving factors: time   Are you  having pain? Yes: NPRS scale: 4/10 at worst  Pain location: left shoudler  Pain description:  sharp  Aggravating factors: use of the shoulder  Relieving factors: time  PRECAUTIONS: None  RED FLAGS: None   WEIGHT BEARING RESTRICTIONS: No  FALLS:  Has patient fallen in last 6 months? 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  DIAGNOSTIC FINDINGS:  No diagnostic findings  PATIENT SURVEYS :  FOTO    COGNITION: Overall cognitive status: Within functional limits for tasks assessed     SENSATION: WFL  POSTURE:   UPPER EXTREMITY ROM:   Active ROM Right eval Left eval  Shoulder flexion 103 102  Shoulder extension    Shoulder abduction    Shoulder adduction    Shoulder internal rotation To L5  To gluteal   Shoulder external rotation full painful  Elbow flexion    Elbow extension    Wrist flexion    Wrist extension    Wrist ulnar deviation    Wrist radial deviation    Wrist pronation    Wrist supination    (Blank rows = not tested)  UPPER EXTREMITY MMT:  MMT Right eval Left eval  Shoulder flexion 4 4  Shoulder extension    Shoulder abduction    Shoulder adduction    Shoulder internal rotation 4+ 4+  Shoulder external rotation 3+ 3+  Middle trapezius    Lower trapezius    Elbow flexion    Elbow extension    Wrist flexion    Wrist extension    Wrist ulnar deviation    Wrist radial deviation    Wrist pronation    Wrist supination    Grip strength (lbs)    (Blank rows = not tested)      PALPATION:  Spasming of upper trap; right mild tenderness to palpation in biceps groove.  No tenderness to palpation in biceps groove on the left   TODAY'S TREATMENT:  DATE:   10/29  Pulleys 2.5 min  Wand flexion 2x10  Bilateral er 3x10 sidelying R- 1lbs L- 0lbs Bilateral abc 2lb 1x each  Rows RTB 2x12 Straight arm pulldowns RTB 2x12 Open books L UE only- x15     10/22  Pulleys 2.5 min  Wand flexion 2x10  Bilateral er 3x10  Bilateral abc 1lb 1x each    Reviewed HEP      Manual: PROM into flexion and ER bilateral grade II and III PA and iferior glides; trigger point release to bilateral upper traps.   Eval: Access Code: [email or web address] URL: [email or web address] Date: 08/29/2023 Prepared by: Lorayne Bender  Exercises - Seated Upper Trapezius Stretch  - 1 x daily - 7 x weekly - 3 sets - 3 reps - 20  hold - Theracane Over Shoulder  - 1 x daily - 7 x weekly - 3 sets - 10 reps - Supine Shoulder Flexion Extension AAROM with Dowel  - 1 x daily - 7 x weekly - 3 sets - 10 reps - Shoulder extension with resistance - Neutral  - 1 x daily - 7 x weekly - 3 sets - 10 reps - Scapular Retraction with Resistance  - 1 x daily - 7 x weekly - 3 sets - 10 reps  Manual: Reviewed bilateral trigger point release to upper traps PATIENT EDUCATION: Education details: HEP, symptom management  Person educated: Patient Education method: Explanation, Demonstration, Tactile cues, Verbal cues, and Handouts Education comprehension: verbalized understanding, returned demonstration, verbal cues required, tactile cues required, and needs further education  HOME EXERCISE PROGRAM: Access Code: [email or web address] URL: [email or web address] Date: 08/29/2023 Prepared by: Lorayne Bender  Exercises - Seated Upper Trapezius Stretch  - 1 x daily - 7 x weekly - 3 sets - 3 reps - 20  hold - Theracane Over Shoulder  - 1 x daily - 7 x weekly - 3 sets - 10 reps - Supine Shoulder Flexion Extension AAROM with Dowel  - 1 x daily - 7 x weekly - 3 sets - 10 reps - Shoulder extension with resistance - Neutral  - 1 x daily - 7 x weekly - 3 sets -  10 reps - Scapular Retraction with Resistance  - 1 x daily - 7 x weekly - 3 sets - 10 reps  ASSESSMENT:  CLINICAL IMPRESSION: The patient had limited shoulder ER left greater than right and mod tightness in her L pec both of which improved following mobilizations and stretching pec. Pt able to progress her weight used during supine ABC with only a mild increase in pain towards the end of the intervention on the right- which decreased upon resting. Pt had mild pain at end range during open books but was able to complete without modifications.  We also gave her a copy of her knee exercises.   We gave her an open book stretch to work on at home to stretch her pec. She had mild pain at the end range.   Eval: Patient is a 57 year old female with gradual onset of bilateral shoulder pain left greater than right.  She presents with limited active and passive flexion, ER, active IR.  She has bilateral weakness particularly in ER.  She has rounded shoulders.  Signs and symptoms are consistent with bilateral rotator cuff impingement.  She would benefit from skilled therapy to improve ability to use shoulders for general function.  OBJECTIVE IMPAIRMENTS: decreased activity tolerance, decreased strength, impaired UE functional use, postural dysfunction, and pain.   ACTIVITY LIMITATIONS: carrying,  lifting, reach over head, and hygiene/grooming  PARTICIPATION LIMITATIONS: meal prep, cleaning, community activity, occupation, and yard work  PERSONAL FACTORS: 1 comorbidity: bilateral knee replacement   are also affecting patient's functional outcome.   REHAB POTENTIAL: Good  CLINICAL DECISION MAKING: Stable/uncomplicated  EVALUATION COMPLEXITY: Low  GOALS: Goals reviewed with patient? Yes  SHORT TERM GOALS: Target date: 09/26/2023    Patient will increase active bilateral shoulder flexion by 15 degrees Baseline: Goal status: still limited 10/29  2.  Patient will increase bilateral shoulder gross  strength by 1 grade Baseline:  Goal status: INITIAL  3.  Patient will reach behind back to L5 without significant pain Baseline:  Goal status: Pain IR 10/29  4.  Patient will be independent with initial HEP Baseline:  Goal status: INITIAL   LONG TERM GOALS: Target date: 10/24/2023    Patient will use bilateral shoulders for ADLs without increased pain Baseline:  Goal status: INITIAL  2.  Patient will sleep through the night without increased pain Baseline:  Goal status: INITIAL  3.  Patient will reach overhead that shelves without increased pain Baseline:  Goal status: INITIAL  4.  Patient will have complete upper extremity strengthening and postural correction program to prevent further exacerbation of shoulder pain Baseline:  Goal status: INITIAL  PLAN: PT FREQUENCY: 2x/week  PT DURATION: 8 weeks  PLANNED INTERVENTIONS: Therapeutic exercises, Therapeutic activity, Neuromuscular re-education, Balance training, Gait training, Patient/Family education, Self Care, Joint mobilization, Stair training, DME instructions, Aquatic Therapy, Dry Needling, Electrical stimulation, Cryotherapy, Moist heat, Taping, Manual therapy, and Re-evaluation.   PLAN FOR NEXT SESSION: Consider manual therapy to improve passive and active range of motion.  Consider PA glides and inferior glides.  Consider trigger point dry needling to bilateral upper traps.  Review HEP.  Consider rotator cuff isometrics with progression to active rotator cuff movement and strengthening.  Consider supine ABC if tolerated.  Progress rows and extensions per RPE.   Dessie Coma, PT 09/17/2023, 3:00 PM

## 2023-09-23 ENCOUNTER — Ambulatory Visit (HOSPITAL_BASED_OUTPATIENT_CLINIC_OR_DEPARTMENT_OTHER): Payer: BC Managed Care – PPO | Attending: Sports Medicine | Admitting: Physical Therapy

## 2023-09-23 ENCOUNTER — Encounter (HOSPITAL_BASED_OUTPATIENT_CLINIC_OR_DEPARTMENT_OTHER): Payer: Self-pay

## 2023-09-23 DIAGNOSIS — M25512 Pain in left shoulder: Secondary | ICD-10-CM | POA: Diagnosis present

## 2023-09-23 DIAGNOSIS — M25561 Pain in right knee: Secondary | ICD-10-CM | POA: Diagnosis present

## 2023-09-23 DIAGNOSIS — R293 Abnormal posture: Secondary | ICD-10-CM | POA: Diagnosis present

## 2023-09-23 NOTE — Therapy (Signed)
OUTPATIENT PHYSICAL THERAPY UPPER EXTREMITY EVALUATION   Patient Name: Diana Herman MRN: [id number] DOB:1966/08/20, 57 y.o., female Today's Date: 09/24/2023  END OF SESSION:  PT End of Session - 09/24/23 0818     Visit Number 4    Number of Visits 8    Date for PT Re-Evaluation 10/25/23    Authorization Type BCBS    PT Start Time 1315    PT Stop Time 1358    PT Time Calculation (min) 43 min    Activity Tolerance Patient tolerated treatment well    Behavior During Therapy [facility] for tasks assessed/performed               Past Medical History:  Diagnosis Date   Achilles rupture, right 08/18/2018   Formatting of this note might be different from the original. 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   Severe sepsis with acute organ dysfunction (HCC) 10/15/2021   Sleep apnea    wears cpap    Past Surgical History:  Procedure Laterality Date   ACHILLES TENDON SURGERY Right    COLONOSCOPY  04/14/2021   [facility]   COLONOSCOPY WITH ESOPHAGOGASTRODUODENOSCOPY (EGD)  01/2022   HYSTERECTOMY     hysteroscopy and polypectomy     POLYPECTOMY     TONSILLECTOMY     WISDOM TOOTH EXTRACTION     early 90's    Patient Active Problem List   Diagnosis Date Noted   Depression with anxiety 10/15/2021   Nausea vomiting and diarrhea 08/13/2021   Type 2 diabetes mellitus without complication (HCC)  08/13/2021   Hx of adenomatous colonic polyps 04/28/2021   Long term current use of systemic steroids 09/21/2020   Primary hypertension 08/30/2020   Morbid obesity (HCC) 01/01/2019   Polymyalgia rheumatica (HCC) 08/27/2017   Primary osteoarthritis of left knee 06/30/2016   Primary osteoarthritis of right knee 06/30/2016   Anxiety 01/17/2016   Impaired glucose tolerance 01/17/2016   Mixed hyperlipidemia 01/17/2016   OSA on CPAP 01/17/2016    PCP: Pricilla Holm MD  REFERRING PROVIDER: Darnell Level MD   REFERRING DIAG:   THERAPY DIAG:  Acute pain of left shoulder  Abnormal posture  Rationale for Evaluation and Treatment: Rehabilitation  ONSET DATE: 5 months prior   SUBJECTIVE:  SUBJECTIVE STATEMENT: The patient reports use of both shoulder L> R is worse when she does actvitiy. Her left hurts pretty much whatever she does. Sleep continues to be difficult.     Eval: Patient began having gradual onset of bilateral shoulder pain prior to her knee surgeries.  She decided to have her knee surgeries first.  The pain is progressively increased over that time.  She has had pain for approximately 6 to 8 months.  She feels like her left is worse than her right.  She has a progressive loss of ability to reach overhead and reach behind her back.  Most of the pain is in the front of her shoulder.  She does a lot of work on [employer].  She also has increased pain when she sleeps on her shoulders. Hand dominance: Right  PERTINENT HISTORY: Bilateral  knee replacement;   PAIN:  Are you having pain? Yes: NPRS scale: 6/10 at worst  Pain location: left shoulder  Pain description: sharp  Aggravating factors: flexion and reaching behind the back  Relieving factors: time   Are you having pain? Yes: NPRS scale:  4/10 at worst  Pain location: left shoudler  Pain description:  sharp  Aggravating factors: use of the shoulder  Relieving factors: time  PRECAUTIONS: None  RED FLAGS: None   WEIGHT BEARING RESTRICTIONS: No  FALLS:  Has patient fallen in last 6 months? 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  DIAGNOSTIC FINDINGS:  No diagnostic findings  PATIENT SURVEYS :  FOTO    COGNITION: Overall cognitive status: Within functional limits for tasks assessed     SENSATION: WFL  POSTURE:   UPPER EXTREMITY ROM:   Active ROM Right eval Left eval  Shoulder flexion 103 102  Shoulder extension    Shoulder abduction    Shoulder adduction    Shoulder internal rotation To L5  To gluteal   Shoulder external rotation full painful  Elbow flexion    Elbow extension    Wrist flexion    Wrist extension    Wrist ulnar deviation    Wrist radial deviation    Wrist pronation    Wrist supination    (Blank rows = not tested)  UPPER EXTREMITY MMT:  MMT Right eval Left eval  Shoulder flexion 4 4  Shoulder extension    Shoulder abduction    Shoulder adduction    Shoulder internal rotation 4+ 4+  Shoulder external rotation 3+ 3+  Middle trapezius    Lower trapezius    Elbow flexion    Elbow extension    Wrist flexion    Wrist extension    Wrist ulnar deviation    Wrist radial deviation    Wrist pronation    Wrist supination    Grip strength (lbs)    (Blank rows = not tested)      PALPATION:  Spasming of upper trap; right mild tenderness to palpation in biceps groove.  No tenderness to palpation in biceps groove on the left   TODAY'S TREATMENT:  DATE:   11/4  Manual: manual: PROM into flexion and ER bilateral grade II and III PA and iferior glides; trigger point release to bilateral upper traps.   Pulleys 2.5 min   Wand flexion 3x 10 2lbs   Bilateral er 3x10 sidelying R- 1lbs L- 1lbs Bilateral abc 2lb 1x each    Rows green 2x15 Shoulder extension green 2x15   Standing flexion 2x10 in mirror  Standing scaption 2x10 yellow       10/29  Pulleys 2.5 min  Wand flexion 2x10  Bilateral er 3x10 sidelying R- 1lbs L- 0lbs Bilateral abc 2lb 1x each  Rows RTB 2x12 Straight arm pulldowns RTB 2x12 Open books L UE only- x15     10/22  Pulleys 2.5 min  Wand flexion 2x10  Bilateral er 3x10  Bilateral abc 1lb 1x each    Reviewed HEP      Manual: PROM into flexion and ER bilateral grade II and III PA and iferior glides; trigger point release to bilateral upper traps.   Eval: Access Code: [email or web address] URL: [email or web address] Date: 08/29/2023 Prepared by: Lorayne Bender  Exercises - Seated Upper Trapezius Stretch  - 1 x daily - 7 x weekly - 3 sets - 3 reps - 20  hold - Theracane Over Shoulder  - 1 x daily - 7 x weekly - 3 sets - 10 reps - Supine Shoulder Flexion Extension AAROM with Dowel  - 1 x daily - 7 x weekly - 3 sets - 10 reps - Shoulder extension with resistance - Neutral  - 1 x daily - 7 x weekly - 3 sets - 10 reps - Scapular Retraction with Resistance  - 1 x daily - 7 x weekly - 3 sets - 10 reps  Manual: Reviewed bilateral trigger point release to upper traps PATIENT EDUCATION: Education details: HEP, symptom management  Person educated: Patient Education method: Explanation, Demonstration, Tactile cues, Verbal cues, and Handouts Education comprehension: verbalized understanding, returned demonstration, verbal cues required, tactile cues required, and needs further education  HOME EXERCISE PROGRAM: Access Code: [email or web address] URL: [email or web address] Date: 08/29/2023 Prepared by:  Lorayne Bender  Exercises - Seated Upper Trapezius Stretch  - 1 x daily - 7 x weekly - 3 sets - 3 reps - 20  hold - Theracane Over Shoulder  - 1 x daily - 7 x weekly - 3 sets - 10 reps - Supine Shoulder Flexion Extension AAROM with Dowel  - 1 x daily - 7 x weekly - 3 sets - 10 reps - Shoulder extension with resistance - Neutral  - 1 x daily - 7 x weekly - 3 sets - 10 reps - Scapular Retraction with Resistance  - 1 x daily - 7 x weekly - 3 sets - 10 reps  ASSESSMENT:  CLINICAL IMPRESSION: Therapy advanced the patients weight with ER on the left and with the cane. Overall she is making progress. She had better range with the left. Her right ER began a little limited today but was full after manual therapy. We will continue to progress as tolerated. We added in standing shoulder flexion. She was advised to do in the mirror at home.    We gave her an open book stretch to work on at home to stretch her pec. She had mild pain at the end range.   Eval: Patient is a 57 year old female with gradual onset of bilateral shoulder pain left greater than right.  She presents with limited active and passive flexion, ER, active IR.  She has bilateral weakness particularly in ER.  She has rounded shoulders.  Signs and symptoms are consistent with bilateral rotator cuff impingement.  She would benefit from skilled therapy to improve ability to use shoulders for general function.  OBJECTIVE IMPAIRMENTS: decreased activity tolerance, decreased strength, impaired UE functional use, postural dysfunction, and pain.   ACTIVITY LIMITATIONS: carrying, lifting, reach over head, and hygiene/grooming  PARTICIPATION LIMITATIONS: meal prep, cleaning, community activity, occupation, and yard work  PERSONAL FACTORS: 1 comorbidity: bilateral knee replacement   are also affecting patient's functional outcome.   REHAB POTENTIAL: Good  CLINICAL DECISION MAKING: Stable/uncomplicated  EVALUATION COMPLEXITY:  Low  GOALS: Goals reviewed with patient? Yes  SHORT TERM GOALS: Target date: 09/26/2023    Patient will increase active bilateral shoulder flexion by 15 degrees Baseline: Goal status: still limited 10/29  2.  Patient will increase bilateral shoulder gross strength by 1 grade Baseline:  Goal status: INITIAL  3.  Patient will reach behind back to L5 without significant pain Baseline:  Goal status: Pain IR 10/29  4.  Patient will be independent with initial HEP Baseline:  Goal status: INITIAL   LONG TERM GOALS: Target date: 10/24/2023    Patient will use bilateral shoulders for ADLs without increased pain Baseline:  Goal status: INITIAL  2.  Patient will sleep through the night without increased pain Baseline:  Goal status: INITIAL  3.  Patient will reach overhead that shelves without increased pain Baseline:  Goal status: INITIAL  4.  Patient will have complete upper extremity strengthening and postural correction program to prevent further exacerbation of shoulder pain Baseline:  Goal status: INITIAL  PLAN: PT FREQUENCY: 2x/week  PT DURATION: 8 weeks  PLANNED INTERVENTIONS: Therapeutic exercises, Therapeutic activity, Neuromuscular re-education, Balance training, Gait training, Patient/Family education, Self Care, Joint mobilization, Stair training, DME instructions, Aquatic Therapy, Dry Needling, Electrical stimulation, Cryotherapy, Moist heat, Taping, Manual therapy, and Re-evaluation.   PLAN FOR NEXT SESSION: Consider manual therapy to improve passive and active range of motion.  Consider PA glides and inferior glides.  Consider trigger point dry needling to bilateral upper traps.  Review HEP.  Consider rotator cuff isometrics with progression to active rotator cuff movement and strengthening.  Consider supine ABC if tolerated.  Progress rows and extensions per RPE.   Dessie Coma, PT 09/24/2023, 8:25 AM

## 2023-09-24 ENCOUNTER — Encounter (HOSPITAL_BASED_OUTPATIENT_CLINIC_OR_DEPARTMENT_OTHER): Payer: Self-pay | Admitting: Physical Therapy

## 2023-10-01 ENCOUNTER — Encounter (HOSPITAL_BASED_OUTPATIENT_CLINIC_OR_DEPARTMENT_OTHER): Payer: Self-pay | Admitting: Physical Therapy

## 2023-10-01 ENCOUNTER — Ambulatory Visit (HOSPITAL_BASED_OUTPATIENT_CLINIC_OR_DEPARTMENT_OTHER): Payer: BC Managed Care – PPO | Admitting: Physical Therapy

## 2023-10-01 ENCOUNTER — Encounter: Payer: Self-pay | Admitting: Internal Medicine

## 2023-10-01 ENCOUNTER — Encounter (HOSPITAL_BASED_OUTPATIENT_CLINIC_OR_DEPARTMENT_OTHER): Payer: BC Managed Care – PPO | Admitting: Physical Therapy

## 2023-10-01 DIAGNOSIS — R293 Abnormal posture: Secondary | ICD-10-CM

## 2023-10-01 DIAGNOSIS — M25512 Pain in left shoulder: Secondary | ICD-10-CM | POA: Diagnosis not present

## 2023-10-01 NOTE — Therapy (Signed)
OUTPATIENT PHYSICAL THERAPY UPPER EXTREMITY EVALUATION   Patient Name: Diana Herman MRN: [id number] DOB:September 09, 1966, 57 y.o., female Today's Date: 10/01/2023  END OF SESSION:  PT End of Session - 10/01/23 1057     Visit Number 5    Number of Visits 8    Date for PT Re-Evaluation 10/25/23    Authorization Type BCBS    PT Start Time 1015    PT Stop Time 1057    PT Time Calculation (min) 42 min    Activity Tolerance Patient tolerated treatment well    Behavior During Therapy WFL for tasks assessed/performed               Past Medical History:  Diagnosis Date   Achilles rupture, right 08/18/2018   Formatting of this note might be different from the original. 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   Severe sepsis with acute organ dysfunction (HCC) 10/15/2021   Sleep apnea    wears cpap    Past Surgical History:  Procedure Laterality Date   ACHILLES TENDON SURGERY Right    COLONOSCOPY  04/14/2021   [facility]   COLONOSCOPY WITH ESOPHAGOGASTRODUODENOSCOPY (EGD)  01/2022   HYSTERECTOMY     hysteroscopy and polypectomy     POLYPECTOMY     TONSILLECTOMY     WISDOM TOOTH EXTRACTION     early 90's    Patient Active Problem List   Diagnosis Date Noted   Depression with anxiety 10/15/2021   Nausea vomiting and diarrhea 08/13/2021   Type 2 diabetes mellitus without complication (HCC)  08/13/2021   Hx of adenomatous colonic polyps 04/28/2021   Long term current use of systemic steroids 09/21/2020   Primary hypertension 08/30/2020   Morbid obesity (HCC) 01/01/2019   Polymyalgia rheumatica (HCC) 08/27/2017   Primary osteoarthritis of left knee 06/30/2016   Primary osteoarthritis of right knee 06/30/2016   Anxiety 01/17/2016   Impaired glucose tolerance 01/17/2016   Mixed hyperlipidemia 01/17/2016   OSA on CPAP 01/17/2016    PCP: Pricilla Holm MD  REFERRING PROVIDER: Darnell Level MD   REFERRING DIAG:   THERAPY DIAG:  Acute pain of left shoulder  Abnormal posture  Rationale for Evaluation and Treatment: Rehabilitation  ONSET DATE: 5 months prior   SUBJECTIVE:  SUBJECTIVE STATEMENT: The patient reports her left shoulder is hurting. Her right is as well but not as bad. She tried a lot of clothes on yesterday.    Eval: Patient began having gradual onset of bilateral shoulder pain prior to her knee surgeries.  She decided to have her knee surgeries first.  The pain is progressively increased over that time.  She has had pain for approximately 6 to 8 months.  She feels like her left is worse than her right.  She has a progressive loss of ability to reach overhead and reach behind her back.  Most of the pain is in the front of her shoulder.  She does a lot of work on [employer].  She also has increased pain when she sleeps on her shoulders. Hand dominance: Right  PERTINENT HISTORY: Bilateral  knee replacement;   PAIN:  Are you having pain? Yes: NPRS scale: 6/10 at worst  Pain location: left shoulder  Pain description: sharp  Aggravating factors: flexion and reaching behind the back  Relieving factors: time   Are you having pain? Yes: NPRS scale: 4/10 at worst  Pain location: left  shoudler  Pain description:  sharp  Aggravating factors: use of the shoulder  Relieving factors: time  PRECAUTIONS: None  RED FLAGS: None   WEIGHT BEARING RESTRICTIONS: No  FALLS:  Has patient fallen in last 6 months? 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  DIAGNOSTIC FINDINGS:  No diagnostic findings  PATIENT SURVEYS :  FOTO    COGNITION: Overall cognitive status: Within functional limits for tasks assessed     SENSATION: WFL  POSTURE:   UPPER EXTREMITY ROM:   Active ROM Right eval Left eval  Shoulder flexion 103 102  Shoulder extension    Shoulder abduction    Shoulder adduction    Shoulder internal rotation To L5  To gluteal   Shoulder external rotation full painful  Elbow flexion    Elbow extension    Wrist flexion    Wrist extension    Wrist ulnar deviation    Wrist radial deviation    Wrist pronation    Wrist supination    (Blank rows = not tested)  UPPER EXTREMITY MMT:  MMT Right eval Left eval  Shoulder flexion 4 4  Shoulder extension    Shoulder abduction    Shoulder adduction    Shoulder internal rotation 4+ 4+  Shoulder external rotation 3+ 3+  Middle trapezius    Lower trapezius    Elbow flexion    Elbow extension    Wrist flexion    Wrist extension    Wrist ulnar deviation    Wrist radial deviation    Wrist pronation    Wrist supination    Grip strength (lbs)    (Blank rows = not tested)      PALPATION:  Spasming of upper trap; right mild tenderness to palpation in biceps groove.  No tenderness to palpation in biceps groove on the left   TODAY'S TREATMENT:  DATE:  11/12  Manual: manual: PROM  into flexion and ER bilateral grade II and III PA and iferior glides; trigger point release to bilateral upper traps.   Pulleys 2.5 min   Wand flexion 3x 10 1lbs   Bilateral abc 2lb 1x each    Rows red 2x15 Shoulder extension red 2x15   11/4  Manual: manual: PROM into flexion and ER bilateral grade II and III PA and iferior glides; trigger point release to bilateral upper traps.   Pulleys 2.5 min   Wand flexion 3x 10 2lbs   Bilateral er 3x10 sidelying R- 1lbs L- 1lbs Bilateral abc 2lb 1x each    Rows green 2x15 Shoulder extension green 2x15   Standing flexion 2x10 in mirror  Standing scaption 2x10 yellow       10/29  Pulleys 2.5 min  Wand flexion 2x10  Bilateral er 3x10 sidelying R- 1lbs L- 0lbs Bilateral abc 2lb 1x each  Rows RTB 2x12 Straight arm pulldowns RTB 2x12 Open books L UE only- x15     10/22  Pulleys 2.5 min  Wand flexion 2x10  Bilateral er 3x10  Bilateral abc 1lb 1x each    Reviewed HEP      Manual: PROM into flexion and ER bilateral grade II and III PA and iferior glides; trigger point release to bilateral upper traps.   Eval: Access Code: [email or web address] URL: [email or web address] Date: 08/29/2023 Prepared by: Lorayne Bender  Exercises - Seated Upper Trapezius Stretch  - 1 x daily - 7 x weekly - 3 sets - 3 reps - 20  hold - Theracane Over Shoulder  - 1 x daily - 7 x weekly - 3 sets - 10 reps - Supine Shoulder Flexion Extension AAROM with Dowel  - 1 x daily - 7 x weekly - 3 sets - 10 reps - Shoulder extension with resistance - Neutral  - 1 x daily - 7 x weekly - 3 sets - 10 reps - Scapular Retraction with Resistance  - 1 x daily - 7 x weekly - 3 sets - 10 reps  Manual: Reviewed bilateral trigger point release to upper traps PATIENT EDUCATION: Education details: HEP, symptom management  Person educated: Patient Education method: Explanation, Demonstration, Tactile cues, Verbal cues, and Handouts Education comprehension:  verbalized understanding, returned demonstration, verbal cues required, tactile cues required, and needs further education  HOME EXERCISE PROGRAM: Access Code: [email or web address] URL: [email or web address] Date: 08/29/2023 Prepared by: Lorayne Bender  Exercises - Seated Upper Trapezius Stretch  - 1 x daily - 7 x weekly - 3 sets - 3 reps - 20  hold - Theracane Over Shoulder  - 1 x daily - 7 x weekly - 3 sets - 10 reps - Supine Shoulder Flexion Extension AAROM with Dowel  - 1 x daily - 7 x weekly - 3 sets - 10 reps - Shoulder extension with resistance - Neutral  - 1 x daily - 7 x weekly - 3 sets - 10 reps - Scapular Retraction with Resistance  - 1 x daily - 7 x weekly - 3 sets - 10 reps  ASSESSMENT:  CLINICAL IMPRESSION: The patient had improved motion with manual therapy. She reported only minor pain with there-ex. She was encouraged to modify her exercises at home. We scaled back her resistance today.Therapy will continue to progress as tolerated.   We gave her an open book stretch to work on at home to stretch her pec. She had mild pain at the end range.   Eval: Patient  is a 57 year old female with gradual onset of bilateral shoulder pain left greater than right.  She presents with limited active and passive flexion, ER, active IR.  She has bilateral weakness particularly in ER.  She has rounded shoulders.  Signs and symptoms are consistent with bilateral rotator cuff impingement.  She would benefit from skilled therapy to improve ability to use shoulders for general function.  OBJECTIVE IMPAIRMENTS: decreased activity tolerance, decreased strength, impaired UE functional use, postural dysfunction, and pain.   ACTIVITY LIMITATIONS: carrying, lifting, reach over head, and hygiene/grooming  PARTICIPATION LIMITATIONS: meal prep, cleaning, community activity, occupation, and yard work  PERSONAL FACTORS: 1 comorbidity: bilateral knee replacement   are also affecting patient's functional  outcome.   REHAB POTENTIAL: Good  CLINICAL DECISION MAKING: Stable/uncomplicated  EVALUATION COMPLEXITY: Low  GOALS: Goals reviewed with patient? Yes  SHORT TERM GOALS: Target date: 09/26/2023    Patient will increase active bilateral shoulder flexion by 15 degrees Baseline: Goal status: still limited 10/29  2.  Patient will increase bilateral shoulder gross strength by 1 grade Baseline:  Goal status: INITIAL  3.  Patient will reach behind back to L5 without significant pain Baseline:  Goal status: Pain IR 10/29  4.  Patient will be independent with initial HEP Baseline:  Goal status: INITIAL   LONG TERM GOALS: Target date: 10/24/2023    Patient will use bilateral shoulders for ADLs without increased pain Baseline:  Goal status: INITIAL  2.  Patient will sleep through the night without increased pain Baseline:  Goal status: INITIAL  3.  Patient will reach overhead that shelves without increased pain Baseline:  Goal status: INITIAL  4.  Patient will have complete upper extremity strengthening and postural correction program to prevent further exacerbation of shoulder pain Baseline:  Goal status: INITIAL  PLAN: PT FREQUENCY: 2x/week  PT DURATION: 8 weeks  PLANNED INTERVENTIONS: Therapeutic exercises, Therapeutic activity, Neuromuscular re-education, Balance training, Gait training, Patient/Family education, Self Care, Joint mobilization, Stair training, DME instructions, Aquatic Therapy, Dry Needling, Electrical stimulation, Cryotherapy, Moist heat, Taping, Manual therapy, and Re-evaluation.   PLAN FOR NEXT SESSION: Consider manual therapy to improve passive and active range of motion.  Consider PA glides and inferior glides.  Consider trigger point dry needling to bilateral upper traps.  Review HEP.  Consider rotator cuff isometrics with progression to active rotator cuff movement and strengthening.  Consider supine ABC if tolerated.  Progress rows and  extensions per RPE.   Dessie Coma, PT 10/01/2023, 11:36 AM

## 2023-10-08 ENCOUNTER — Ambulatory Visit (HOSPITAL_BASED_OUTPATIENT_CLINIC_OR_DEPARTMENT_OTHER): Payer: BC Managed Care – PPO

## 2023-10-08 ENCOUNTER — Encounter (HOSPITAL_BASED_OUTPATIENT_CLINIC_OR_DEPARTMENT_OTHER): Payer: Self-pay

## 2023-10-08 DIAGNOSIS — M25512 Pain in left shoulder: Secondary | ICD-10-CM | POA: Diagnosis not present

## 2023-10-08 DIAGNOSIS — M25561 Pain in right knee: Secondary | ICD-10-CM

## 2023-10-08 DIAGNOSIS — R293 Abnormal posture: Secondary | ICD-10-CM

## 2023-10-08 NOTE — Therapy (Signed)
OUTPATIENT PHYSICAL THERAPY UPPER EXTREMITY TREATMENT   Patient Name: Diana Herman MRN: [id number] DOB:17-Apr-1966, 57 y.o., female Today's Date: 10/08/2023  END OF SESSION:  PT End of Session - 10/08/23 1303     Visit Number 6    Number of Visits 8    Date for PT Re-Evaluation 10/25/23    Authorization Type BCBS    PT Start Time 1301    PT Stop Time 1345    PT Time Calculation (min) 44 min    Activity Tolerance Patient tolerated treatment well    Behavior During Therapy [facility] for tasks assessed/performed                Past Medical History:  Diagnosis Date   Achilles rupture, right 08/18/2018   Formatting of this note might be different from the original. 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   Severe sepsis with acute organ dysfunction (HCC) 10/15/2021   Sleep apnea    wears cpap    Past Surgical History:  Procedure Laterality Date   ACHILLES TENDON SURGERY Right    COLONOSCOPY  04/14/2021   [facility]   COLONOSCOPY WITH ESOPHAGOGASTRODUODENOSCOPY (EGD)  01/2022   HYSTERECTOMY     hysteroscopy and polypectomy     POLYPECTOMY     TONSILLECTOMY     WISDOM TOOTH EXTRACTION     early 90's    Patient Active Problem List   Diagnosis Date Noted   Depression with anxiety 10/15/2021   Nausea vomiting and diarrhea 08/13/2021   Type 2 diabetes mellitus without complication (HCC)  08/13/2021   Hx of adenomatous colonic polyps 04/28/2021   Long term current use of systemic steroids 09/21/2020   Primary hypertension 08/30/2020   Morbid obesity (HCC) 01/01/2019   Polymyalgia rheumatica (HCC) 08/27/2017   Primary osteoarthritis of left knee 06/30/2016   Primary osteoarthritis of right knee 06/30/2016   Anxiety 01/17/2016   Impaired glucose tolerance 01/17/2016   Mixed hyperlipidemia 01/17/2016   OSA on CPAP 01/17/2016    PCP: Pricilla Holm MD  REFERRING PROVIDER: Darnell Level MD   REFERRING DIAG:   THERAPY DIAG:  Acute pain of left shoulder  Abnormal posture  Acute pain of right knee  Rationale for Evaluation and Treatment: Rehabilitation  ONSET DATE: 5 months prior   SUBJECTIVE:  SUBJECTIVE STATEMENT: Pt reports continued shoulder pain with elevation and reaching behind back .    Eval: Patient began having gradual onset of bilateral shoulder pain prior to her knee surgeries.  She decided to have her knee surgeries first.  The pain is progressively increased over that time.  She has had pain for approximately 6 to 8 months.  She feels like her left is worse than her right.  She has a progressive loss of ability to reach overhead and reach behind her back.  Most of the pain is in the front of her shoulder.  She does a lot of work on [employer].  She also has increased pain when she sleeps on her shoulders. Hand dominance: Right  PERTINENT HISTORY: Bilateral  knee replacement;   PAIN:  Are you having pain? Yes: NPRS scale: 6/10 at worst  Pain location: left shoulder  Pain description: sharp  Aggravating factors: flexion and reaching behind the back  Relieving factors: time   Are you having pain? Yes: NPRS scale: 4/10 at worst  Pain location: left shoudler  Pain  description:  sharp  Aggravating factors: use of the shoulder  Relieving factors: time  PRECAUTIONS: None  RED FLAGS: None   WEIGHT BEARING RESTRICTIONS: No  FALLS:  Has patient fallen in last 6 months? 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  DIAGNOSTIC FINDINGS:  No diagnostic findings  PATIENT SURVEYS :  FOTO    COGNITION: Overall cognitive status: Within functional limits for tasks assessed     SENSATION: WFL  POSTURE:   UPPER EXTREMITY ROM:   Active ROM Right eval Left eval  Shoulder flexion 103 102  Shoulder extension    Shoulder abduction    Shoulder adduction    Shoulder internal rotation To L5  To gluteal   Shoulder external rotation full painful  Elbow flexion    Elbow extension    Wrist flexion    Wrist extension    Wrist ulnar deviation    Wrist radial deviation    Wrist pronation    Wrist supination    (Blank rows = not tested)  UPPER EXTREMITY MMT:  MMT Right eval Left eval  Shoulder flexion 4 4  Shoulder extension    Shoulder abduction    Shoulder adduction    Shoulder internal rotation 4+ 4+  Shoulder external rotation 3+ 3+  Middle trapezius    Lower trapezius    Elbow flexion    Elbow extension    Wrist flexion    Wrist extension    Wrist ulnar deviation    Wrist radial deviation    Wrist pronation    Wrist supination    Grip strength (lbs)    (Blank rows = not tested)      PALPATION:  Spasming of upper trap; right mild tenderness to palpation in biceps groove.  No tenderness to palpation in biceps groove on the left   TODAY'S TREATMENT:  DATE:   11/19 -PROM bil shoulders all planes -STM bil  pec and posterior shoulder mm -Supine wand flexion 2# bar 2x10 -pulleys flexion , abduction, 2 min -pec stretch at doorway  11/12  Manual: manual: PROM into flexion and ER bilateral grade II and III PA and iferior glides; trigger point release to bilateral upper traps.   Pulleys 2.5 min   Wand flexion 3x 10 1lbs   Bilateral abc 2lb 1x each    Rows red 2x15 Shoulder extension red 2x15   11/4  Manual: manual: PROM into flexion and ER bilateral grade II and III PA and iferior glides; trigger point release to bilateral upper traps.   Pulleys 2.5 min   Wand flexion 3x 10 2lbs   Bilateral er 3x10 sidelying R- 1lbs L- 1lbs Bilateral abc 2lb 1x each    Rows green 2x15 Shoulder extension green 2x15   Standing flexion 2x10 in mirror  Standing scaption 2x10 yellow       10/29  Pulleys 2.5 min  Wand flexion 2x10  Bilateral er 3x10 sidelying R- 1lbs L- 0lbs Bilateral abc 2lb 1x each  Rows RTB 2x12 Straight arm pulldowns RTB 2x12 Open books L UE only- x15     10/22  Pulleys 2.5 min  Wand flexion 2x10  Bilateral er 3x10  Bilateral abc 1lb 1x each    Reviewed HEP      Manual: PROM into flexion and ER bilateral grade II and III PA and iferior glides; trigger point release to bilateral upper traps.   Eval: Access Code: [email or web address] URL: [email or web address] Date: 08/29/2023 Prepared by: Lorayne Bender  Exercises - Seated Upper Trapezius Stretch  - 1 x daily - 7 x weekly - 3 sets - 3 reps - 20  hold - Theracane Over Shoulder  - 1 x daily - 7 x weekly - 3 sets - 10 reps - Supine Shoulder Flexion Extension AAROM with Dowel  - 1 x daily - 7 x weekly - 3 sets - 10 reps - Shoulder extension with resistance - Neutral  - 1 x daily - 7 x weekly - 3 sets - 10 reps - Scapular Retraction with Resistance  - 1 x daily - 7 x weekly - 3 sets - 10 reps  Manual: Reviewed bilateral trigger point release to upper traps PATIENT EDUCATION: Education details:  HEP, symptom management  Person educated: Patient Education method: Explanation, Demonstration, Tactile cues, Verbal cues, and Handouts Education comprehension: verbalized understanding, returned demonstration, verbal cues required, tactile cues required, and needs further education  HOME EXERCISE PROGRAM: Access Code: [email or web address] URL: [email or web address] Date: 08/29/2023 Prepared by: Lorayne Bender  Exercises - Seated Upper Trapezius Stretch  - 1 x daily - 7 x weekly - 3 sets - 3 reps - 20  hold - Theracane Over Shoulder  - 1 x daily - 7 x weekly - 3 sets - 10 reps - Supine Shoulder Flexion Extension AAROM with Dowel  - 1 x daily - 7 x weekly - 3 sets - 10 reps - Shoulder extension with resistance - Neutral  - 1 x daily - 7 x weekly - 3 sets - 10 reps - Scapular Retraction with Resistance  - 1 x daily - 7 x weekly - 3 sets - 10 reps  ASSESSMENT:  CLINICAL IMPRESSION: Pt continues with tightness in bilateral pecs and periscapular mm. Spent time on STM to these areas which helped to increase her available ROM. Pt reported benefit from pec stretch at doorway.  Instructed patient  to use tennis ball for self MFR over bilateral periscapular muscles and pecs.  With resisted scapular retraction patient demonstrated decreased retraction of left scapula compared to right.  Required tactile cues to correct and prevent UTI and levator activation.  Spent time educating patient on proper retraction mechanics and to practice this with her HEP.  Eval: Patient is a 57 year old female with gradual onset of bilateral shoulder pain left greater than right.  She presents with limited active and passive flexion, ER, active IR.  She has bilateral weakness particularly in ER.  She has rounded shoulders.  Signs and symptoms are consistent with bilateral rotator cuff impingement.  She would benefit from skilled therapy to improve ability to use shoulders for general function.  OBJECTIVE IMPAIRMENTS:  decreased activity tolerance, decreased strength, impaired UE functional use, postural dysfunction, and pain.   ACTIVITY LIMITATIONS: carrying, lifting, reach over head, and hygiene/grooming  PARTICIPATION LIMITATIONS: meal prep, cleaning, community activity, occupation, and yard work  PERSONAL FACTORS: 1 comorbidity: bilateral knee replacement   are also affecting patient's functional outcome.   REHAB POTENTIAL: Good  CLINICAL DECISION MAKING: Stable/uncomplicated  EVALUATION COMPLEXITY: Low  GOALS: Goals reviewed with patient? Yes  SHORT TERM GOALS: Target date: 09/26/2023    Patient will increase active bilateral shoulder flexion by 15 degrees Baseline: Goal status: still limited 10/29  2.  Patient will increase bilateral shoulder gross strength by 1 grade Baseline:  Goal status: INITIAL  3.  Patient will reach behind back to L5 without significant pain Baseline:  Goal status: Pain IR 10/29  4.  Patient will be independent with initial HEP Baseline:  Goal status: INITIAL   LONG TERM GOALS: Target date: 10/24/2023    Patient will use bilateral shoulders for ADLs without increased pain Baseline:  Goal status: INITIAL  2.  Patient will sleep through the night without increased pain Baseline:  Goal status: INITIAL  3.  Patient will reach overhead that shelves without increased pain Baseline:  Goal status: INITIAL  4.  Patient will have complete upper extremity strengthening and postural correction program to prevent further exacerbation of shoulder pain Baseline:  Goal status: INITIAL  PLAN: PT FREQUENCY: 2x/week  PT DURATION: 8 weeks  PLANNED INTERVENTIONS: Therapeutic exercises, Therapeutic activity, Neuromuscular re-education, Balance training, Gait training, Patient/Family education, Self Care, Joint mobilization, Stair training, DME instructions, Aquatic Therapy, Dry Needling, Electrical stimulation, Cryotherapy, Moist heat, Taping, Manual therapy, and  Re-evaluation.   PLAN FOR NEXT SESSION: Consider manual therapy to improve passive and active range of motion.  Consider PA glides and inferior glides.  Consider trigger point dry needling to bilateral upper traps.  Review HEP.  Consider rotator cuff isometrics with progression to active rotator cuff movement and strengthening.  Consider supine ABC if tolerated.  Progress rows and extensions per RPE.   Donnel Saxon Rebbecca Osuna, PTA 10/08/2023, 1:58 PM

## 2023-10-15 ENCOUNTER — Ambulatory Visit (HOSPITAL_BASED_OUTPATIENT_CLINIC_OR_DEPARTMENT_OTHER): Payer: BC Managed Care – PPO

## 2023-10-15 ENCOUNTER — Encounter (HOSPITAL_BASED_OUTPATIENT_CLINIC_OR_DEPARTMENT_OTHER): Payer: Self-pay

## 2023-10-15 DIAGNOSIS — R293 Abnormal posture: Secondary | ICD-10-CM

## 2023-10-15 DIAGNOSIS — M25561 Pain in right knee: Secondary | ICD-10-CM

## 2023-10-15 DIAGNOSIS — M25512 Pain in left shoulder: Secondary | ICD-10-CM

## 2023-10-15 NOTE — Therapy (Signed)
OUTPATIENT PHYSICAL THERAPY UPPER EXTREMITY TREATMENT   Patient Name: Diana Herman MRN: [id number] DOB:May 25, 1966, 57 y.o., female Today's Date: 10/15/2023  END OF SESSION:  PT End of Session - 10/15/23 1306     Visit Number 7    Number of Visits 8    Date for PT Re-Evaluation 10/25/23    Authorization Type BCBS    PT Start Time 1302    PT Stop Time 1345    PT Time Calculation (min) 43 min    Activity Tolerance Patient tolerated treatment well    Behavior During Therapy [facility] for tasks assessed/performed                 Past Medical History:  Diagnosis Date   Achilles rupture, right 08/18/2018   Formatting of this note might be different from the original. 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   Severe sepsis with acute organ dysfunction (HCC) 10/15/2021   Sleep apnea    wears cpap    Past Surgical History:  Procedure Laterality Date   ACHILLES TENDON SURGERY Right    COLONOSCOPY  04/14/2021   [facility]   COLONOSCOPY WITH ESOPHAGOGASTRODUODENOSCOPY (EGD)  01/2022   HYSTERECTOMY     hysteroscopy and polypectomy     POLYPECTOMY     TONSILLECTOMY     WISDOM TOOTH EXTRACTION     early 90's    Patient Active Problem List   Diagnosis Date Noted   Depression with anxiety 10/15/2021   Nausea vomiting and diarrhea 08/13/2021   Type 2 diabetes mellitus without complication  (HCC) 08/13/2021   Hx of adenomatous colonic polyps 04/28/2021   Long term current use of systemic steroids 09/21/2020   Primary hypertension 08/30/2020   Morbid obesity (HCC) 01/01/2019   Polymyalgia rheumatica (HCC) 08/27/2017   Primary osteoarthritis of left knee 06/30/2016   Primary osteoarthritis of right knee 06/30/2016   Anxiety 01/17/2016   Impaired glucose tolerance 01/17/2016   Mixed hyperlipidemia 01/17/2016   OSA on CPAP 01/17/2016    PCP: Pricilla Holm MD  REFERRING PROVIDER: Darnell Level MD   REFERRING DIAG:   THERAPY DIAG:  Acute pain of left shoulder  Acute pain of right knee  Abnormal posture  Rationale for Evaluation and Treatment: Rehabilitation  ONSET DATE: 5 months prior   SUBJECTIVE:  SUBJECTIVE STATEMENT: Pt reports improved pain level and ROM since last visit. "Whatever we did really helped." "I can touch my fingers behind my back now."  Able to sleep better without pain.   Eval: Patient began having gradual onset of bilateral shoulder pain prior to her knee surgeries.  She decided to have her knee surgeries first.  The pain is progressively increased over that time.  She has had pain for approximately 6 to 8 months.  She feels like her left is worse than her right.  She has a progressive loss of ability to reach overhead and reach behind her back.  Most of the pain is in the front of her shoulder.  She does a lot of work on [employer].  She also has increased pain when she sleeps on her shoulders. Hand dominance: Right  PERTINENT HISTORY: Bilateral  knee replacement;   PAIN:  Are you having pain? Yes: NPRS scale: 6/10 at worst  Pain location: left shoulder  Pain description: sharp  Aggravating factors: flexion and reaching behind the back  Relieving factors: time    Are you having pain? Yes: NPRS scale: 4/10 at worst  Pain location: left shoudler  Pain description:  sharp  Aggravating factors: use of the shoulder  Relieving factors: time  PRECAUTIONS: None  RED FLAGS: None   WEIGHT BEARING RESTRICTIONS: No  FALLS:  Has patient fallen in last 6 months? 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  DIAGNOSTIC FINDINGS:  No diagnostic findings  PATIENT SURVEYS :  FOTO    COGNITION: Overall cognitive status: Within functional limits for tasks assessed     SENSATION: WFL  POSTURE:   UPPER EXTREMITY ROM:   Active ROM Right eval Left eval  Shoulder flexion 103 102  Shoulder extension    Shoulder abduction    Shoulder adduction    Shoulder internal rotation To L5  To gluteal   Shoulder external rotation full painful  Elbow flexion    Elbow extension    Wrist flexion    Wrist extension    Wrist ulnar deviation    Wrist radial deviation    Wrist pronation    Wrist supination    (Blank rows = not tested)  UPPER EXTREMITY MMT:  MMT Right eval Left eval  Shoulder flexion 4 4  Shoulder extension    Shoulder abduction    Shoulder adduction    Shoulder internal rotation 4+ 4+  Shoulder external rotation 3+ 3+  Middle trapezius    Lower trapezius    Elbow flexion    Elbow extension    Wrist flexion    Wrist extension    Wrist ulnar deviation    Wrist radial deviation    Wrist pronation    Wrist supination    Grip strength (lbs)    (Blank rows = not tested)      PALPATION:  Spasming of upper trap; right mild tenderness to palpation in biceps groove.  No tenderness to palpation in biceps groove on the left   TODAY'S TREATMENT:  DATE:   11/26 -PROM bil shoulders all planes -STM bil pec and posterior shoulder mm -passive shoulder extension IR behind back in side-lying position.  Bilateral -Resisted row green Thera-Band 2 x 15 Resisted shoulder extension green Thera-Band 2 x 15 -pulleys flexion   11/19 -PROM bil shoulders all planes -STM bil pec and posterior shoulder mm -Supine wand flexion 2# bar 2x10 -pulleys flexion , abduction, 2 min -pec stretch at doorway  11/12  Manual: manual: PROM into flexion and ER bilateral grade II and III PA and iferior glides; trigger point release to bilateral upper traps.   Pulleys 2.5 min   Wand flexion 3x 10 1lbs   Bilateral abc 2lb 1x each    Rows red 2x15 Shoulder extension red 2x15   11/4  Manual: manual: PROM into flexion and ER bilateral grade II and III PA and iferior glides; trigger point release to bilateral upper traps.   Pulleys 2.5 min   Wand flexion 3x 10 2lbs   Bilateral er 3x10 sidelying R- 1lbs L- 1lbs Bilateral abc 2lb 1x each    Rows green 2x15 Shoulder extension green 2x15   Standing flexion 2x10 in mirror  Standing scaption 2x10 yellow       10/29  Pulleys 2.5 min  Wand flexion 2x10  Bilateral er 3x10 sidelying R- 1lbs L- 0lbs Bilateral abc 2lb 1x each  Rows RTB 2x12 Straight arm pulldowns RTB 2x12 Open books L UE only- x15     10/22  Pulleys 2.5 min  Wand flexion 2x10  Bilateral er 3x10  Bilateral abc 1lb 1x each    Reviewed HEP      Manual: PROM into flexion and ER bilateral grade II and III PA and iferior glides; trigger point release to bilateral upper traps.   Eval: Access Code: [email or web address] URL: [email or web address] Date: 08/29/2023 Prepared by: Lorayne Bender  Exercises - Seated Upper Trapezius Stretch  - 1 x daily - 7 x weekly - 3 sets - 3 reps - 20  hold - Theracane Over Shoulder  - 1 x daily - 7 x weekly - 3 sets - 10 reps - Supine  Shoulder Flexion Extension AAROM with Dowel  - 1 x daily - 7 x weekly - 3 sets - 10 reps - Shoulder extension with resistance - Neutral  - 1 x daily - 7 x weekly - 3 sets - 10 reps - Scapular Retraction with Resistance  - 1 x daily - 7 x weekly - 3 sets - 10 reps  Manual: Reviewed bilateral trigger point release to upper traps PATIENT EDUCATION: Education details: HEP, symptom management  Person educated: Patient Education method: Explanation, Demonstration, Tactile cues, Verbal cues, and Handouts Education comprehension: verbalized understanding, returned demonstration, verbal cues required, tactile cues required, and needs further education  HOME EXERCISE PROGRAM: Access Code: [email or web address] URL: [email or web address] Date: 08/29/2023 Prepared by: Lorayne Bender  Exercises - Seated Upper Trapezius Stretch  - 1 x daily - 7 x weekly - 3 sets - 3 reps - 20  hold - Theracane Over Shoulder  - 1 x daily - 7 x weekly - 3 sets - 10 reps - Supine Shoulder Flexion Extension AAROM with Dowel  - 1 x daily - 7 x weekly - 3 sets - 10 reps - Shoulder extension with resistance - Neutral  - 1 x daily - 7 x weekly - 3 sets - 10 reps - Scapular Retraction with Resistance  - 1 x daily - 7 x weekly - 3 sets -  10 reps  ASSESSMENT:  CLINICAL IMPRESSION: Continued with passive range of motion and manual techniques due to positive response from last session.  Patient with initial stiffness in left shoulder flexion and abduction which improved following joint mobilizations and STM.  Patient does remain limited and has discomfort in IR on both sides, though this did improve slightly following manual interventions.  Continue to work rhomboids and mid trap to improve scapular retraction.  Educated patient about use of theracane for home use as patient will be unable to attend PT until January due to new insurance.  Patient to continue with existing HEP, avoiding painful ranges.  Patient to call with any  questions or concerns.  Eval: Patient is a 57 year old female with gradual onset of bilateral shoulder pain left greater than right.  She presents with limited active and passive flexion, ER, active IR.  She has bilateral weakness particularly in ER.  She has rounded shoulders.  Signs and symptoms are consistent with bilateral rotator cuff impingement.  She would benefit from skilled therapy to improve ability to use shoulders for general function.  OBJECTIVE IMPAIRMENTS: decreased activity tolerance, decreased strength, impaired UE functional use, postural dysfunction, and pain.   ACTIVITY LIMITATIONS: carrying, lifting, reach over head, and hygiene/grooming  PARTICIPATION LIMITATIONS: meal prep, cleaning, community activity, occupation, and yard work  PERSONAL FACTORS: 1 comorbidity: bilateral knee replacement   are also affecting patient's functional outcome.   REHAB POTENTIAL: Good  CLINICAL DECISION MAKING: Stable/uncomplicated  EVALUATION COMPLEXITY: Low  GOALS: Goals reviewed with patient? Yes  SHORT TERM GOALS: Target date: 09/26/2023    Patient will increase active bilateral shoulder flexion by 15 degrees Baseline: Goal status: still limited 10/29  2.  Patient will increase bilateral shoulder gross strength by 1 grade Baseline:  Goal status: INITIAL  3.  Patient will reach behind back to L5 without significant pain Baseline:  Goal status: Pain IR 10/29  4.  Patient will be independent with initial HEP Baseline:  Goal status: INITIAL   LONG TERM GOALS: Target date: 10/24/2023    Patient will use bilateral shoulders for ADLs without increased pain Baseline:  Goal status: INITIAL  2.  Patient will sleep through the night without increased pain Baseline:  Goal status: INITIAL  3.  Patient will reach overhead that shelves without increased pain Baseline:  Goal status: INITIAL  4.  Patient will have complete upper extremity strengthening and postural  correction program to prevent further exacerbation of shoulder pain Baseline:  Goal status: INITIAL  PLAN: PT FREQUENCY: 2x/week  PT DURATION: 8 weeks  PLANNED INTERVENTIONS: Therapeutic exercises, Therapeutic activity, Neuromuscular re-education, Balance training, Gait training, Patient/Family education, Self Care, Joint mobilization, Stair training, DME instructions, Aquatic Therapy, Dry Needling, Electrical stimulation, Cryotherapy, Moist heat, Taping, Manual therapy, and Re-evaluation.   PLAN FOR NEXT SESSION: Consider manual therapy to improve passive and active range of motion.  Consider PA glides and inferior glides.  Consider trigger point dry needling to bilateral upper traps.  Review HEP.  Consider rotator cuff isometrics with progression to active rotator cuff movement and strengthening.  Consider supine ABC if tolerated.  Progress rows and extensions per RPE.   Donnel Saxon Bonnita Newby, PTA 10/15/2023, 5:01 PM

## 2024-01-20 ENCOUNTER — Encounter: Payer: Self-pay | Admitting: Internal Medicine

## 2024-02-05 ENCOUNTER — Ambulatory Visit (AMBULATORY_SURGERY_CENTER): Payer: Self-pay

## 2024-02-05 VITALS — Ht 65.0 in | Wt 230.0 lb

## 2024-02-05 DIAGNOSIS — Z860100 Personal history of colon polyps, unspecified: Secondary | ICD-10-CM

## 2024-02-05 NOTE — Progress Notes (Signed)
 No egg or soy allergy known to patient  No issues known to pt with past sedation with any surgeries or procedures Patient denies ever being told they had issues or difficulty with intubation  No FH of Malignant Hyperthermia Pt is not on diet pills Pt is not on  home 02  Pt is not on blood thinners  Pt denies issues with constipation  No A fib or A flutter Have any cardiac testing pending--no  LOA: independent Prep: spilt dose miralax   Patient's chart reviewed by Cathlyn Parsons CNRA prior to previsit and patient appropriate for the [facility].  Previsit completed and red dot placed by patient's name on their procedure day (on provider's schedule).     PV completed with patient. Prep instructions sent via mychart and home address.

## 2024-02-05 NOTE — Patient Instructions (Signed)
 [facility] has implemented a new process for scheduling procedures.  Please note your arrival time for the [facility] is your appointment time that is shown on your written instructions.  Please do not arrive one hour prior to the time listed in your instructions.  Please ignore any outside notifications to arrive one hour early.  We apologize for any confusion and look forward to seeing you for your procedure.

## 2024-02-25 ENCOUNTER — Encounter: Payer: Self-pay | Admitting: Internal Medicine

## 2024-02-25 NOTE — Progress Notes (Unsigned)
  Gastroenterology History and Physical   Primary Care Physician:  Deloris Ping, MD   Reason for Procedure:  History of colon polyps  Plan:    Colonoscopy     HPI: Diana Herman is a 58 y.o. female with a history of recurrent nausea vomiting and recurrent C. difficile polymyalgia rheumatica on chronic steroids, impaired glucose tolerance, hypertension and sleep apnea presenting for surveillance colonoscopy.  As reflected below last procedure was in 2023 with plans for recall in November 2024.  04/14/21 3 adenomas max 25 mm  02/13/22 residual cecal adenoma recall 6 mos 07/2022 09/19/2022-5 mm residual cecal adenoma and 2 diminutive adenomas recall 1 year 2024 Past Medical History:  Diagnosis Date   Achilles rupture, right 08/18/2018   Formatting of this note might be different from the original. Added automatically from request for surgery [id number]   AKI (acute kidney injury) (HCC) 08/13/2021   Allergy    Anemia    Anxiety    Cancer (HCC)    ENDOMETRIAL   Cataract    forming    COVID-19 11/2021   Depression    DM (diabetes mellitus) (HCC)    GERD (gastroesophageal reflux disease)    past    Hx of adenomatous colonic polyps 04/28/2021   Hyperlipidemia    Hypertension    Insomnia    Morbid obesity (HCC)    OA (osteoarthritis)    OSA (obstructive sleep apnea)    on CPAP   Polymyalgia rheumatica (HCC)    Postmenopausal bleeding 03/17/2020   Recurrent colitis due to Clostridioides difficile 09/2021   Severe sepsis with acute organ dysfunction (HCC) 10/15/2021   Sleep apnea    wears cpap     Past Surgical History:  Procedure Laterality Date   ACHILLES TENDON SURGERY Right    COLONOSCOPY  04/14/2021   [facility]   COLONOSCOPY WITH ESOPHAGOGASTRODUODENOSCOPY (EGD)  01/2022   HYSTERECTOMY     hysteroscopy and polypectomy     POLYPECTOMY     TONSILLECTOMY     WISDOM TOOTH EXTRACTION     early 90's     Prior to Admission medications    Medication Sig Start Date End Date Taking? Authorizing Provider  ARIPiprazole (ABILIFY) 5 MG tablet Take 5 mg by mouth daily.    [provider]  ascorbic acid (VITAMIN C) 250 MG tablet Take 500 mg by mouth daily.    [provider]  Blood Glucose Monitoring Suppl (CONTOUR NEXT GEN MONITOR) w/Device KIT USE TO CHECK BLOOD SUGAR AS DIRECTED 08/21/22   [provider]  Calcium Carbonate (CALCIUM-CARB 600 PO) Take 600 mg by mouth daily.    [provider]  cholecalciferol (VITAMIN D) 25 MCG (1000 UNIT) tablet Take 1,000 Units by mouth at bedtime.    [provider]  clonazePAM (KLONOPIN) 0.5 MG tablet Take 0.5 mg by mouth 2 (two) times daily as needed for anxiety. 07/19/08   [provider]  CONTOUR NEXT TEST test strip daily. 08/16/22   [provider]  DULoxetine (CYMBALTA) 60 MG capsule Take 120 mg by mouth daily. 05/30/20   [provider]  ferrous gluconate (FERGON) 324 MG tablet Take 324 mg by mouth daily. 09/13/21   [provider]  gabapentin (NEURONTIN) 300 MG capsule Take 300 mg by mouth 2 (two) times daily. 01/18/21   [provider]  ibuprofen (ADVIL) 200 MG tablet Take 800 mg by mouth every 6 (six) hours as needed for headache or moderate pain.  [provider]  Lancets (ONETOUCH DELICA PLUS LANCET30G) MISC USE TO TEST BLOOD GLUCOSE ONCE DAILY 08/16/22   [provider]  losartan (COZAAR) 25 MG tablet Take 25 mg by mouth daily. 10/27/21   [provider]  metFORMIN (GLUCOPHAGE) 1000 MG tablet Take 1,000 mg by mouth 2 (two) times daily. 08/30/22   [provider]  ondansetron (ZOFRAN-ODT) 8 MG disintegrating tablet DISSOLVE 1 TABLET(8 MG) ON THE TONGUE EVERY 8 HOURS AS NEEDED FOR NAUSEA OR VOMITING 08/28/22   Iva Boop, MD  OVER THE COUNTER MEDICATION Take 1 capsule by mouth daily. Cutrelle probiotic    [provider]  pantoprazole (PROTONIX) 40 MG tablet  TAKE 1 TABLET(40 MG) BY MOUTH DAILY BEFORE BREAKFAST 08/15/23   Iva Boop, MD  Polyethyl Glycol-Propyl Glycol (SYSTANE OP) Place 1 drop into both eyes daily.    [provider]  promethazine (PHENERGAN) 25 MG suppository Place 1 suppository (25 mg total) rectally every 6 (six) hours as needed for nausea or vomiting. Patient not taking: Reported on 01/11/2023 04/11/22   Iva Boop, MD  rosuvastatin (CRESTOR) 10 MG tablet Take 10 mg by mouth at bedtime. 07/10/21   [provider]  traZODone (DESYREL) 50 MG tablet Take 100 mg by mouth at bedtime. 05/30/20   [provider]    Current Outpatient Medications  Medication Sig Dispense Refill   ARIPiprazole (ABILIFY) 5 MG tablet Take 5 mg by mouth daily.     ascorbic acid (VITAMIN C) 250 MG tablet Take 500 mg by mouth daily.     Blood Glucose Monitoring Suppl (CONTOUR NEXT GEN MONITOR) w/Device KIT USE TO CHECK BLOOD SUGAR AS DIRECTED     Calcium Carbonate (CALCIUM-CARB 600 PO) Take 600 mg by mouth daily.     cholecalciferol (VITAMIN D) 25 MCG (1000 UNIT) tablet Take 1,000 Units by mouth at bedtime.     clonazePAM (KLONOPIN) 0.5 MG tablet Take 0.5 mg by mouth 2 (two) times daily as needed for anxiety.     CONTOUR NEXT TEST test strip daily.     DULoxetine (CYMBALTA) 60 MG capsule Take 120 mg by mouth daily.     gabapentin (NEURONTIN) 300 MG capsule Take 300 mg by mouth 2 (two) times daily.     Lancets (ONETOUCH DELICA PLUS LANCET30G) MISC USE TO TEST BLOOD GLUCOSE ONCE DAILY     losartan (COZAAR) 25 MG tablet Take 25 mg by mouth daily.     metFORMIN (GLUCOPHAGE) 1000 MG tablet Take 1,000 mg by mouth 2 (two) times daily.     OVER THE COUNTER MEDICATION Take 1 capsule by mouth daily. Cutrelle probiotic     pantoprazole (PROTONIX) 40 MG tablet TAKE 1 TABLET(40 MG) BY MOUTH DAILY BEFORE BREAKFAST 90 tablet 1   Polyethyl Glycol-Propyl Glycol (SYSTANE OP) Place 1 drop into both eyes daily.     rosuvastatin (CRESTOR) 10 MG  tablet Take 10 mg by mouth at bedtime.     traZODone (DESYREL) 50 MG tablet Take 100 mg by mouth at bedtime.     ferrous gluconate (FERGON) 324 MG tablet Take 324 mg by mouth daily.     ibuprofen (ADVIL) 200 MG tablet Take 800 mg by mouth every 6 (six) hours as needed for headache or moderate pain.     ondansetron (ZOFRAN-ODT) 8 MG disintegrating tablet DISSOLVE 1 TABLET(8 MG) ON THE TONGUE EVERY 8 HOURS AS NEEDED FOR NAUSEA OR VOMITING 30 tablet 2   promethazine (PHENERGAN) 25 MG suppository Place  1 suppository (25 mg total) rectally every 6 (six) hours as needed for nausea or vomiting. (Patient not taking: Reported on 01/11/2023) 12 each 0   Current Facility-Administered Medications  Medication Dose Route Frequency Provider Last Rate Last Admin   0.9 %  sodium chloride infusion  500 mL Intravenous Continuous Iva Boop, MD        Allergies as of 02/26/2024   (No Known Allergies)    Family History  Problem Relation Age of Onset   Colon polyps Mother    Depression Mother    Hypertension Mother    Hyperlipidemia Mother    Diabetes Father    Alcoholism Father    Pancreatitis Father    Heart disease Maternal Uncle    Stroke Maternal Uncle    Non-Hodgkin's lymphoma Maternal Grandmother    Heart attack Maternal Grandmother    Depression Maternal Grandmother    Hypertension Maternal Grandmother    Hyperlipidemia Maternal Grandmother    Diabetes Maternal Grandfather    Heart disease Maternal Grandfather    Hypertension Maternal Grandfather    Alcoholism Paternal Grandmother    Diverticulitis Paternal Grandmother    Colon cancer Neg Hx    Esophageal cancer Neg Hx    Rectal cancer Neg Hx    Stomach cancer Neg Hx    Crohn's disease Neg Hx    Ulcerative colitis Neg Hx     Social History   Socioeconomic History   Marital status: Single    Spouse name: Not on file   Number of children: 0   Years of education: Not on file   Highest education level: Not on file  Occupational  History   Occupation: Eating Disorder Dietitian  Tobacco Use   Smoking status: Never    Passive exposure: Never   Smokeless tobacco: Never  Vaping Use   Vaping status: Never Used  Substance and Sexual Activity   Alcohol use: Yes    Comment: once a year   Drug use: Never   Sexual activity: Not Currently  Other Topics Concern   Not on file  Social History Narrative   Patient is single, no children.     She is an eating disorders dietitian   Never smoker 1-2 caffeinated beverages daily no alcohol no other tobacco and no drug use   Social Drivers of Corporate investment banker Strain: Medium Risk (11/02/2023)   Received from [facility]   Overall Financial Resource Strain (CARDIA)    Difficulty of Paying Living Expenses: Somewhat hard  Food Insecurity: No Food Insecurity (11/02/2023)   Received from [facility]   Hunger Vital Sign    Worried About Running Out of Food in the Last Year: Never true    Ran Out of Food in the Last Year: Never true  Transportation Needs: No Transportation Needs (11/02/2023)   Received from [facility] - Transportation    Lack of Transportation (Medical): No    Lack of Transportation (Non-Medical): No  Physical Activity: Unknown (11/02/2023)   Received from [facility]   Exercise Vital Sign    Days of Exercise per Week: 0 days    Minutes of Exercise per Session: Not on file  Recent Concern: Physical Activity - Inactive (11/02/2023)   Received [facility]   Exercise Vital Sign    Days of Exercise per Week: 0 days    Minutes of Exercise per Session: 30 min  Stress: Stress Concern Present (11/02/2023)   Received from [facility]  [employer] of Occupational Health - Occupational Stress Questionnaire    Feeling of Stress : To some extent  Social Connections: Somewhat Isolated (11/02/2023)   Received from [facility]   Social Network    How would you rate your social network (family, work, friends)?: Restricted  participation with some degree of social isolation  Intimate Partner Violence: Not At Risk (11/02/2023)   Received from [facility]   HITS    Over the last 12 months how often did your partner physically hurt you?: Never    Over the last 12 months how often did your partner insult you or talk down to you?: Never    Over the last 12 months how often did your partner threaten you with physical harm?: Never    Over the last 12 months how often did your partner scream or curse at you?: Never    Review of Systems:  All other review of systems negative except as mentioned in the HPI.  Physical Exam: Vital signs BP 113/65   Pulse 72   Temp (!) 97.4 F (36.3 C)   Ht 5\' 5"  (1.651 m)   Wt 230 lb (104.3 kg)   LMP 03/21/2012   SpO2 96%   BMI 38.27 kg/m   General:   Alert,  Well-developed, well-nourished, pleasant and cooperative in NAD Lungs:  Clear throughout to auscultation.   Heart:  Regular rate and rhythm; no murmurs, clicks, rubs,  or gallops. Abdomen:  Soft, nontender and nondistended. Normal bowel sounds.   Neuro/Psych:  Alert and cooperative. Normal mood and affect. A and O x 3   @Domini Vandehei  Sena Slate, MD, [facility]astroenterology [phone] (pager) 02/26/2024 9:04 AM@

## 2024-02-26 ENCOUNTER — Ambulatory Visit (AMBULATORY_SURGERY_CENTER): Payer: Self-pay | Admitting: Internal Medicine

## 2024-02-26 ENCOUNTER — Encounter: Payer: Self-pay | Admitting: Internal Medicine

## 2024-02-26 VITALS — BP 108/61 | HR 65 | Temp 97.4°F | Resp 13 | Ht 65.0 in | Wt 230.0 lb

## 2024-02-26 DIAGNOSIS — K635 Polyp of colon: Secondary | ICD-10-CM

## 2024-02-26 DIAGNOSIS — D124 Benign neoplasm of descending colon: Secondary | ICD-10-CM

## 2024-02-26 DIAGNOSIS — Z860101 Personal history of adenomatous and serrated colon polyps: Secondary | ICD-10-CM

## 2024-02-26 DIAGNOSIS — Z860100 Personal history of colon polyps, unspecified: Secondary | ICD-10-CM

## 2024-02-26 DIAGNOSIS — Z1211 Encounter for screening for malignant neoplasm of colon: Secondary | ICD-10-CM | POA: Diagnosis present

## 2024-02-26 DIAGNOSIS — D125 Benign neoplasm of sigmoid colon: Secondary | ICD-10-CM | POA: Diagnosis not present

## 2024-02-26 MED ORDER — SODIUM CHLORIDE 0.9 % IV SOLN: 500.0000 mL | INTRAVENOUS | Status: AC

## 2024-02-26 NOTE — Progress Notes (Signed)
 Called to room to assist during endoscopic procedure.  Patient ID and intended procedure confirmed with present staff. Received instructions for my participation in the procedure from the performing physician.

## 2024-02-26 NOTE — Progress Notes (Signed)
 Pt's states no medical or surgical changes since previsit or office visit.

## 2024-02-26 NOTE — Patient Instructions (Addendum)
 Only 2 small polyps today.  I will let you know pathology results and when to have another routine colonoscopy by mail and/or My Chart.  Thinking repeat in 5 years.  I appreciate the opportunity to care for you. Iva Boop, MD, FACG  YOU HAD AN ENDOSCOPIC PROCEDURE TODAY AT THE [facility]:   Refer to the procedure report that was given to you for any specific questions about what was found during the examination.  If the procedure report does not answer your questions, please call your gastroenterologist to clarify.  If you requested that your care partner not be given the details of your procedure findings, then the procedure report has been included in a sealed envelope for you to review at your convenience later.  YOU SHOULD EXPECT: Some feelings of bloating in the abdomen. Passage of more gas than usual.  Walking can help get rid of the air that was put into your GI tract during the procedure and reduce the bloating. If you had a lower endoscopy (such as a colonoscopy or flexible sigmoidoscopy) you may notice spotting of blood in your stool or on the toilet paper. If you underwent a bowel prep for your procedure, you may not have a normal bowel movement for a few days.  Please Note:  You might notice some irritation and congestion in your nose or some drainage.  This is from the oxygen used during your procedure.  There is no need for concern and it should clear up in a day or so.  SYMPTOMS TO REPORT IMMEDIATELY:  Following lower endoscopy (colonoscopy or flexible sigmoidoscopy):  Excessive amounts of blood in the stool  Significant tenderness or worsening of abdominal pains  Swelling of the abdomen that is new, acute  Fever of 100F or higher   For urgent or emergent issues, a gastroenterologist can be reached at any hour by calling [phone]. Do not use MyChart messaging for urgent concerns.    DIET:  We do recommend a small meal at first, but then you may  proceed to your regular diet.  Drink plenty of fluids but you should avoid alcoholic beverages for 24 hours.  MEDICATIONS: Continue present medications.  FOLLOW UP:  Repeat colonoscopy is recommended for surveillance. The colonoscopy date will be determined after pathology results from today's exam become available for review. Likely 5 years.  Please see handouts given to you by your recovery nurse: Polyps.  Thank you for allowing [place] to provide for your healthcare needs today.  ACTIVITY:  You should plan to take it easy for the rest of today and you should NOT DRIVE or use heavy machinery until tomorrow (because of the sedation medicines used during the test).    FOLLOW UP: Our staff will call the number listed on your records the next business day following your procedure.  We will call around 7:15- 8:00 am to check on you and address any questions or concerns that you may have regarding the information given to you following your procedure. If we do not reach you, we will leave a message.     If any biopsies were taken you will be contacted by phone or by letter within the next 1-3 weeks.  Please call us at [phone] if you have not heard about the biopsies in 3 weeks.    SIGNATURES/CONFIDENTIALITY: You and/or your care partner have signed paperwork which will be entered into your electronic medical record.  These signatures attest to the fact that  that the information above on your After Visit Summary has been reviewed and is understood.  Full responsibility of the confidentiality of this discharge information lies with you and/or your care-partner.

## 2024-02-26 NOTE — Op Note (Signed)
 [facility] Patient Name: Diana Herman Procedure Date: 02/26/2024 8:55 AM MRN: [id number] Endoscopist: Iva Boop , MD, [id number] Age: 58 Referring MD:  Date of Birth: 1966-05-21 Gender: Female Account #: [id number] Procedure:                Colonoscopy Indications:              High risk colon cancer surveillance: Personal                            history of colonic polyps, Last colonoscopy:                            November 2023 closer f/u due to 25 mm cecal polyp                            requiring multiple procedures as below                           04/14/21 3 adenomas max 25 mm (cecum)                           02/13/22 residual cecal adenoma recall 6 mos 07/2022                           09/19/2022-5 mm residual cecal adenoma and 2                            diminutive adenomas Medicines:                Monitored Anesthesia Care Procedure:                Pre-Anesthesia Assessment:                           - Prior to the procedure, a History and Physical                            was performed, and patient medications and                            allergies were reviewed. The patient's tolerance of                            previous anesthesia was also reviewed. The risks                            and benefits of the procedure and the sedation                            options and risks were discussed with the patient.                            All questions were answered, and informed consent  was obtained. Prior Anticoagulants: The patient has                            taken no anticoagulant or antiplatelet agents. ASA                            Grade Assessment: II - A patient with mild systemic                            disease. After reviewing the risks and benefits,                            the patient was deemed in satisfactory condition to                            undergo the procedure.                            After obtaining informed consent, the colonoscope                            was passed under direct vision. Throughout the                            procedure, the patient's blood pressure, pulse, and                            oxygen saturations were monitored continuously. The                            CF [id number] #[id number] was introduced through the anus                            and advanced to the the cecum, identified by                            appendiceal orifice and ileocecal valve. The                            colonoscopy was performed without difficulty. The                            patient tolerated the procedure well. The quality                            of the bowel preparation was good. The ileocecal                            valve, appendiceal orifice, and rectum were                            photographed. The bowel preparation used was  Miralax via split dose instruction. Scope In: 9:09:51 AM Scope Out: 9:31:38 AM Scope Withdrawal Time: 0 hours 18 minutes 45 seconds  Total Procedure Duration: 0 hours 21 minutes 47 seconds  Findings:                 The perianal and digital rectal examinations were                            normal.                           Two sessile polyps were found in the sigmoid colon                            and descending colon. The polyps were diminutive in                            size. These polyps were removed with a cold snare.                            Resection and retrieval were complete. Verification                            of patient identification for the specimen was                            done. Estimated blood loss was minimal.                           The exam was otherwise without abnormality on                            direct and retroflexion views. Complications:            No immediate complications. Estimated Blood Loss:     Estimated blood loss was minimal. Impression:                - Two diminutive polyps in the sigmoid colon and in                            the descending colon, removed with a cold snare.                            Resected and retrieved.                           - The examination was otherwise normal on direct                            and retroflexion views. No residual polyp seen in                            cecum where multiple polypectomies and also  coagulation therapy used previously.                           - Personal history of colonic polyps as outlined in                            indications Recommendation:           - Patient has a contact number available for                            emergencies. The signs and symptoms of potential                            delayed complications were discussed with the                            patient. Return to normal activities tomorrow.                            Written discharge instructions were provided to the                            patient.                           - Resume previous diet.                           - Continue present medications.                           - Repeat colonoscopy is recommended for                            surveillance. The colonoscopy date will be                            determined after pathology results from today's                            exam become available for review. Likely 5 years Iva Boop, MD 02/26/2024 9:47:12 AM This report has been signed electronically.

## 2024-02-26 NOTE — Progress Notes (Signed)
 Sedate, gd SR, tolerated procedure well, VSS, report to RN

## 2024-02-27 ENCOUNTER — Telehealth: Payer: Self-pay | Admitting: *Deleted

## 2024-02-27 NOTE — Telephone Encounter (Signed)
  Follow up Call-     02/26/2024    8:38 AM 09/19/2022   11:08 AM 02/13/2022    2:13 PM  Call back number  Post procedure Call Back phone  # [phone]  Permission to leave phone message Yes Yes Yes     Patient questions:  Do you have a fever, pain , or abdominal swelling? No. Pain Score  0 *  Have you tolerated food without any problems? Yes.    Have you been able to return to your normal activities? Yes.    Do you have any questions about your discharge instructions: Diet   No. Medications  No. Follow up visit  No.  Do you have questions or concerns about your Care? No.  Actions: * If pain score is 4 or above: No action needed, pain <4.

## 2024-02-28 LAB — SURGICAL PATHOLOGY

## 2024-02-29 ENCOUNTER — Encounter: Payer: Self-pay | Admitting: Internal Medicine

## 2024-02-29 DIAGNOSIS — Z860101 Personal history of adenomatous and serrated colon polyps: Secondary | ICD-10-CM

## 2024-03-03 ENCOUNTER — Encounter: Payer: Self-pay | Admitting: Internal Medicine

## 2024-03-23 ENCOUNTER — Ambulatory Visit: Payer: Self-pay | Admitting: Podiatry
